# Patient Record
Sex: Female | Born: 1965 | Race: Black or African American | Hispanic: No | Marital: Single | Smoking: Never smoker
Health system: Southern US, Community
[De-identification: ages and names within clinical notes are randomized; demographics above are authoritative.]

## PROBLEM LIST (undated history)

## (undated) DIAGNOSIS — Z8042 Family history of malignant neoplasm of prostate: Secondary | ICD-10-CM

## (undated) DIAGNOSIS — Z923 Personal history of irradiation: Secondary | ICD-10-CM

## (undated) DIAGNOSIS — I10 Essential (primary) hypertension: Secondary | ICD-10-CM

## (undated) DIAGNOSIS — E119 Type 2 diabetes mellitus without complications: Secondary | ICD-10-CM

## (undated) DIAGNOSIS — Z803 Family history of malignant neoplasm of breast: Secondary | ICD-10-CM

## (undated) DIAGNOSIS — Z9221 Personal history of antineoplastic chemotherapy: Secondary | ICD-10-CM

## (undated) DIAGNOSIS — C50919 Malignant neoplasm of unspecified site of unspecified female breast: Secondary | ICD-10-CM

## (undated) HISTORY — DX: Family history of malignant neoplasm of breast: Z80.3

## (undated) HISTORY — PX: BREAST BIOPSY: SHX20

## (undated) HISTORY — DX: Family history of malignant neoplasm of prostate: Z80.42

---
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
## 1998-04-24 ENCOUNTER — Other Ambulatory Visit: Admission: RE | Admit: 1998-04-24 | Discharge: 1998-04-24 | Payer: Self-pay | Admitting: *Deleted

## 2015-11-17 DIAGNOSIS — E119 Type 2 diabetes mellitus without complications: Secondary | ICD-10-CM | POA: Diagnosis not present

## 2015-11-17 DIAGNOSIS — I10 Essential (primary) hypertension: Secondary | ICD-10-CM | POA: Diagnosis not present

## 2015-11-17 DIAGNOSIS — Z0000 Encounter for general adult medical examination without abnormal findings: Secondary | ICD-10-CM | POA: Diagnosis not present

## 2016-01-17 DIAGNOSIS — Z1231 Encounter for screening mammogram for malignant neoplasm of breast: Secondary | ICD-10-CM | POA: Diagnosis not present

## 2016-01-17 DIAGNOSIS — Z6841 Body Mass Index (BMI) 40.0 and over, adult: Secondary | ICD-10-CM | POA: Diagnosis not present

## 2016-01-17 DIAGNOSIS — Z01419 Encounter for gynecological examination (general) (routine) without abnormal findings: Secondary | ICD-10-CM | POA: Diagnosis not present

## 2016-01-23 DIAGNOSIS — Z1211 Encounter for screening for malignant neoplasm of colon: Secondary | ICD-10-CM | POA: Diagnosis not present

## 2016-01-23 DIAGNOSIS — K5730 Diverticulosis of large intestine without perforation or abscess without bleeding: Secondary | ICD-10-CM | POA: Diagnosis not present

## 2016-01-23 DIAGNOSIS — K648 Other hemorrhoids: Secondary | ICD-10-CM | POA: Diagnosis not present

## 2016-05-20 ENCOUNTER — Emergency Department (HOSPITAL_BASED_OUTPATIENT_CLINIC_OR_DEPARTMENT_OTHER): Payer: Federal, State, Local not specified - PPO

## 2016-05-20 ENCOUNTER — Encounter (HOSPITAL_BASED_OUTPATIENT_CLINIC_OR_DEPARTMENT_OTHER): Payer: Self-pay

## 2016-05-20 ENCOUNTER — Emergency Department (HOSPITAL_BASED_OUTPATIENT_CLINIC_OR_DEPARTMENT_OTHER)
Admission: EM | Admit: 2016-05-20 | Discharge: 2016-05-20 | Disposition: A | Discharge status: Home / Self Care | Payer: Federal, State, Local not specified - PPO | Attending: Emergency Medicine | Admitting: Emergency Medicine

## 2016-05-20 DIAGNOSIS — Z7982 Long term (current) use of aspirin: Secondary | ICD-10-CM | POA: Insufficient documentation

## 2016-05-20 DIAGNOSIS — Z7984 Long term (current) use of oral hypoglycemic drugs: Secondary | ICD-10-CM | POA: Insufficient documentation

## 2016-05-20 DIAGNOSIS — M79652 Pain in left thigh: Secondary | ICD-10-CM | POA: Diagnosis not present

## 2016-05-20 DIAGNOSIS — E119 Type 2 diabetes mellitus without complications: Secondary | ICD-10-CM | POA: Diagnosis not present

## 2016-05-20 DIAGNOSIS — S8992XA Unspecified injury of left lower leg, initial encounter: Secondary | ICD-10-CM | POA: Diagnosis not present

## 2016-05-20 DIAGNOSIS — I10 Essential (primary) hypertension: Secondary | ICD-10-CM | POA: Diagnosis not present

## 2016-05-20 DIAGNOSIS — M79662 Pain in left lower leg: Secondary | ICD-10-CM | POA: Insufficient documentation

## 2016-05-20 HISTORY — DX: Essential (primary) hypertension: I10

## 2016-05-20 HISTORY — DX: Type 2 diabetes mellitus without complications: E11.9

## 2016-05-20 NOTE — ED Notes (Signed)
Pt refused immobilizer , switched to ace wrap Pa aware

## 2016-05-20 NOTE — ED Provider Notes (Signed)
[facility] Provider Note   CSN: [id number] Arrival date & time: 05/20/16  1134     History   Chief Complaint Chief Complaint  Patient presents with  . Leg Pain    HPI Cassandra Sanders is a 51 y.o. female who presents today with chief complaint ongoing suddenly worsening left leg pain. She states she has had pain in her left calf since one month ago when she fell forward and hit her knee and calf on an object while getting dressed. She states pain is intermittent, and crampy in nature "like a Charlie horse".  She states pain increased yesterday after a long day walking for several hours and being an usher at [employer]. She states yesterday pain radiated into her thigh and buttocks, were usually pain does not radiate. She states elevation and icy hot have been helpful. Walking and exercise helps the pain. Pain is aggravated by sitting down too long. She states she knows she has hypertension but did not take her blood pressure medication today.  Denies numbness, tingling, weakness, head trauma, LOC, CP, SOB, abd pain, n/v/d, back or neck pain  The history is provided by the patient.    Past Medical History:  Diagnosis Date  . Diabetes mellitus without complication ([place])   . Hypertension     There are no active problems to display for this patient.   History reviewed. No pertinent surgical history.  OB History    No data available       Home Medications    Prior to Admission medications   Medication Sig Start Date End Date Taking? Authorizing Provider  aspirin EC 81 MG tablet Take 81 mg by mouth daily.   Yes [provider]  LISINOPRIL PO Take by mouth.   Yes [provider]  METFORMIN HCL PO Take by mouth.   Yes [provider]    Family History No family history on file.  Social History Social History  Substance Use Topics  . Smoking status: Never Smoker  . Smokeless tobacco: Never Used  . Alcohol use No     Allergies     Patient has no known allergies.   Review of Systems Review of Systems  Constitutional: Negative for chills and fever.  Respiratory: Negative for shortness of breath.   Cardiovascular: Negative for chest pain.  Gastrointestinal: Negative for abdominal pain, diarrhea, nausea and vomiting.  Musculoskeletal: Positive for myalgias. Negative for back pain, joint swelling and neck pain.  Neurological: Negative for syncope, weakness and numbness.  All other systems reviewed and are negative.    Physical Exam Updated Vital Signs BP (!) 176/100 Comment: pt states did not take BP meds today  Pulse 89   Temp 98.7 F (37.1 C) (Oral)   Resp 18   Ht 5\' 9"  (1.753 m)   Wt 133.4 kg   SpO2 98%   BMI 43.42 kg/m   Physical Exam  Constitutional: She appears well-developed and well-nourished. No distress.  HENT:  Head: Normocephalic and atraumatic.  Eyes: Conjunctivae are normal.  Neck: Neck supple. No JVD present. No tracheal deviation present.  Cardiovascular: Normal rate, regular rhythm and intact distal pulses.   No murmur heard. 2+ radial and DP/PT pulses bl, negative homans  Pulmonary/Chest: Effort normal and breath sounds normal. No respiratory distress.  Abdominal: Soft. There is no tenderness.  Musculoskeletal: Normal range of motion. She exhibits tenderness. She exhibits no deformity.  Full range of motion of left knee and hip. No varus or  valgus instability. Negative anterior/posterior drawer tests. No tenderness to palpation along the joint line, MCL, LCL. 5/5 strength of BLE. No tenderness to palpation along the hip. There is lateral mid calf tenderness with no swelling, or erythema. Bilateral ankles with swelling, no erythema or tenderness. Patient states that this is chronic since birth and unchanged. Compartments soft.   Neurological: She is alert.  Skin: Skin is warm and dry.  Psychiatric: She has a normal mood and affect. Her behavior is normal.  Nursing note and vitals  reviewed.    ED Treatments / Results  Labs (all labs ordered are listed, but only abnormal results are displayed) Labs Reviewed - No data to display  EKG  EKG Interpretation None       Radiology Dg Tibia/fibula Left  Result Date: 05/20/2016 CLINICAL DATA:  Fall 1 month ago.  Calf pain EXAM: LEFT TIBIA AND FIBULA - 2 VIEW COMPARISON:  None FINDINGS: Mild degenerative changes in the left knee. No acute bony abnormality. Specifically, no fracture, subluxation, or dislocation. Soft tissues are intact. IMPRESSION: No acute bony abnormality. Electronically Signed   By: Rolm Baptise M.D.   On: 05/20/2016 12:48   US Venous Img Lower Unilateral Left  Result Date: 05/20/2016 CLINICAL DATA:  Left lower extremity pain and tightness after falling 1 month ago. EXAM: Left LOWER EXTREMITY VENOUS DOPPLER ULTRASOUND TECHNIQUE: Gray-scale sonography with graded compression, as well as color Doppler and duplex ultrasound were performed to evaluate the lower extremity deep venous systems from the level of the common femoral vein and including the common femoral, femoral, profunda femoral, popliteal and calf veins including the posterior tibial, peroneal and gastrocnemius veins when visible. The superficial great saphenous vein was also interrogated. Spectral Doppler was utilized to evaluate flow at rest and with distal augmentation maneuvers in the common femoral, femoral and popliteal veins. COMPARISON:  None. FINDINGS: Contralateral Common Femoral Vein: Respiratory phasicity is normal and symmetric with the symptomatic side. No evidence of thrombus. Normal compressibility. Common Femoral Vein: No evidence of thrombus. Normal compressibility, respiratory phasicity and response to augmentation. Saphenofemoral Junction: No evidence of thrombus. Normal compressibility and flow on color Doppler imaging. Profunda Femoral Vein: No evidence of thrombus. Normal compressibility and flow on color Doppler imaging. Femoral  Vein: No evidence of thrombus. Normal compressibility, respiratory phasicity and response to augmentation. Popliteal Vein: No evidence of thrombus. Normal compressibility, respiratory phasicity and response to augmentation. Calf Veins: No evidence of thrombus. Normal compressibility and flow on color Doppler imaging. Superficial Great Saphenous Vein: No evidence of thrombus. Normal compressibility and flow on color Doppler imaging. Venous Reflux:  None. Other Findings:  None. IMPRESSION: No evidence of DVT within the left lower extremity. Electronically Signed   By: Nelson Chimes M.D.   On: 05/20/2016 12:41    Procedures Procedures (including critical care time)  Medications Ordered in ED Medications - No data to display   Initial Impression / Assessment and Plan / ED Course  I have reviewed the triage vital signs and the nursing notes.  Pertinent labs & imaging results that were available during my care of the patient were reviewed by me and considered in my medical decision making (see chart for details).     Patient with ongoing left lateral calf pain for 1 month. Hypertensive, discussed taking her blood pressure medication (which she did not do today) and follow up with PCP for reevaluation. Neurovascularly intact. X-rays negative for fracture dislocation or other abnormality, obtained duplex which showed no evidence of  DVT. Low suspicion of compartment syndrome or arterial pathology. Examination of the knee shows no ligamentous laxity or evidence of Baker's cyst. No further emergent workup required. Given knee immobilizer, discussed continued RICE therapy, tylenol, NSAIDs, heat, ice, and follow up with orthopedics for further evaluation. Discussed strict ED return precautions. Pt verbalized understanding of and agreement with plan and is safe for discharge home at this time. Final Clinical Impressions(s) / ED Diagnoses   Final diagnoses:  Pain of left calf    New Prescriptions Discharge  Medication List as of 05/20/2016  1:14 PM       Renita Papa, PA-C 05/20/16 2010    Veryl Speak, MD 05/27/16 [phone]

## 2016-05-20 NOTE — Discharge Instructions (Signed)
Apply heat or ice to the affected area. Alternate ibuprofen and Tylenol as needed for pain. You may use knee immobilizer for comfort. Follow-up with the orthopedic doctors for further evaluation. Return to the ED if any concerning symptoms develop. Continue taking your blood pressure medications daily.   Use Moist heat therapy by taking a dish rag and soaking in water then ringing out excess water then microwave for 10-15 seconds until hot but not so hot that it would burn and heat to areas of soreness for 15 minutes, multiple times a day.

## 2016-05-20 NOTE — ED Triage Notes (Signed)
Pt states she injured left LE with slip approx 1 month-c/o increase in pain "tight" started yesterday-NAD-steady gait

## 2016-05-20 NOTE — ED Notes (Signed)
ED Provider at bedside. 

## 2016-05-27 ENCOUNTER — Ambulatory Visit (INDEPENDENT_AMBULATORY_CARE_PROVIDER_SITE_OTHER): Payer: Federal, State, Local not specified - PPO | Admitting: Family Medicine

## 2016-05-27 ENCOUNTER — Encounter: Payer: Self-pay | Admitting: Family Medicine

## 2016-05-27 DIAGNOSIS — S86819A Strain of other muscle(s) and tendon(s) at lower leg level, unspecified leg, initial encounter: Secondary | ICD-10-CM | POA: Diagnosis not present

## 2016-05-27 NOTE — Patient Instructions (Signed)
You have a calf strain Calf raises 3 sets of 10 once a day - when easy can do them on one leg - finally can advance to doing them on a step. I would do the exercises daily for about 4 weeks. Compression sleeve or ACE wrap only if needed at this point. Icing for 15 minutes at a time 3-4 times a day Follow up with me as needed.

## 2016-05-30 DIAGNOSIS — S86819A Strain of other muscle(s) and tendon(s) at lower leg level, unspecified leg, initial encounter: Secondary | ICD-10-CM | POA: Insufficient documentation

## 2016-05-30 NOTE — Assessment & Plan Note (Signed)
doppler u/s negative.  Pain resolved at this point. Shown home exercises to do daily.  Compression sleeve, icing, tylenol only if needed.

## 2016-05-30 NOTE — Progress Notes (Signed)
PCP: Lennie Odor, PA-C  Subjective:   HPI: Patient is a 50 y.o. female here for left calf injury.  Patient reports about 3 weeks ago the smoke detector went off when she was getting dressed. She got out of bed quickly and struck lateral left calf on end of the bed. Pain was fairly severe then. Required taking tylenol. She went to be seen in ED with concern for blood clot - doppler was negative. She walks a lot at work. Pain level now 0/10 and doesn't bother her. No skin changes, numbness.  Past Medical History:  Diagnosis Date  . Diabetes mellitus without complication ([place])   . Hypertension     Current Outpatient Prescriptions on File Prior to Visit  Medication Sig Dispense Refill  . aspirin EC 81 MG tablet Take 81 mg by mouth daily.    Marland Kitchen METFORMIN HCL PO Take by mouth.     No current facility-administered medications on file prior to visit.     No past surgical history on file.  No Known Allergies  Social History   Social History  . Marital status: Single    Spouse name: N/A  . Number of children: N/A  . Years of education: N/A   Occupational History  . Not on file.   Social History Main Topics  . Smoking status: Never Smoker  . Smokeless tobacco: Never Used  . Alcohol use No  . Drug use: No  . Sexual activity: Not on file   Other Topics Concern  . Not on file   Social History Narrative  . No narrative on file    No family history on file.  Ht 5\' 9"  (1.753 m)   Wt 294 lb 12.8 oz (133.7 kg)   BMI 43.53 kg/m   Review of Systems: See HPI above.     Objective:  Physical Exam:  Gen: NAD, comfortable in exam room  Left leg: No gross deformity, swelling, bruising. No TTP. FROM ankle and knee without pain. Strength 5/5 with knee flexion/extension, all ankle motions. NVI distally. Negative thompsons.  Right leg: FROM without pain.   Assessment & Plan:  1. Left calf strain - doppler u/s negative.  Pain resolved at this point. Shown home  exercises to do daily.  Compression sleeve, icing, tylenol only if needed.

## 2016-06-18 ENCOUNTER — Ambulatory Visit: Payer: Federal, State, Local not specified - PPO | Admitting: Family Medicine

## 2016-06-19 ENCOUNTER — Ambulatory Visit (INDEPENDENT_AMBULATORY_CARE_PROVIDER_SITE_OTHER): Payer: Federal, State, Local not specified - PPO | Admitting: Family Medicine

## 2016-06-19 DIAGNOSIS — M25511 Pain in right shoulder: Secondary | ICD-10-CM

## 2016-06-19 MED ORDER — DICLOFENAC SODIUM 75 MG PO TBEC: 75.0000 mg | DELAYED_RELEASE_TABLET | Freq: Two times a day (BID) | ORAL | 1 refills | Status: DC

## 2016-06-19 NOTE — Patient Instructions (Signed)
You have rotator cuff impingement Try to avoid painful activities (overhead activities, lifting with extended arm) as much as possible. Diclofenac 75mg  twice a day with food for pain and inflammation- take regularly for 7-10 days then as needed. Can take tylenol in addition to this. Subacromial injection may be beneficial to help with pain and to decrease inflammation. Consider physical therapy with transition to home exercise program. Do home exercise program with theraband and scapular stabilization exercises daily - these are very important for long term relief even if an injection was given.  3 sets of 10 once a day. If not improving at follow-up we will consider further imaging, injection, physical therapy, and/or nitro patches. Typically follow up with me in 1 month but call me sooner if you're struggling and want to try therapy or injection.

## 2016-06-20 ENCOUNTER — Encounter: Payer: Self-pay | Admitting: Family Medicine

## 2016-06-21 DIAGNOSIS — M25511 Pain in right shoulder: Secondary | ICD-10-CM | POA: Insufficient documentation

## 2016-06-21 NOTE — Assessment & Plan Note (Signed)
2/2 rotator cuff impingement.  Reviewed home exercises to do daily.  Diclofenac twice a day with food.  Will consider physical therapy, injection, nitro patches if not improving.  F/u in 1 month.

## 2016-06-21 NOTE — Progress Notes (Signed)
PCP: Lennie Odor, PA-C  Subjective:   HPI: Patient is a 51 y.o. female here for right shoulder pain.  Patient reports about 2 weeks ago she woke up with right shoulder pain. Felt she slept wrong on the sofa leading to pain. Pain level 4/10, sharp and lateral. Radiates down to elbow. Has been using heating pad. Worse with overhead motions, lying on this side. No skin changes, numbness.  Past Medical History:  Diagnosis Date  . Diabetes mellitus without complication ([place])   . Hypertension     Current Outpatient Prescriptions on File Prior to Visit  Medication Sig Dispense Refill  . aspirin EC 81 MG tablet Take 81 mg by mouth daily.    Marland Kitchen JANUVIA 100 MG tablet     . lisinopril (PRINIVIL,ZESTRIL) 10 MG tablet Take 10 mg by mouth daily.  2  . METFORMIN HCL PO Take by mouth.     No current facility-administered medications on file prior to visit.     No past surgical history on file.  No Known Allergies  Social History   Social History  . Marital status: Single    Spouse name: N/A  . Number of children: N/A  . Years of education: N/A   Occupational History  . Not on file.   Social History Main Topics  . Smoking status: Never Smoker  . Smokeless tobacco: Never Used  . Alcohol use No  . Drug use: No  . Sexual activity: Not on file   Other Topics Concern  . Not on file   Social History Narrative  . No narrative on file    No family history on file.  BP (!) 169/106   Ht 5\' 9"  (1.753 m)   Wt 285 lb (129.3 kg)   BMI 42.09 kg/m   Review of Systems: See HPI above.     Objective:  Physical Exam:  Gen: NAD, comfortable in exam room  Right shoulder: No swelling, ecchymoses.  No gross deformity. No TTP. FROM with painful arc. Negative Hawkins, Neers. Negative Yergasons. Strength 5/5 with empty can and resisted internal/external rotation. Negative apprehension. NV intact distally.  Left shoulder: FROM without pain.   Assessment & Plan:  1.  Right shoulder pain - 2/2 rotator cuff impingement.  Reviewed home exercises to do daily.  Diclofenac twice a day with food.  Will consider physical therapy, injection, nitro patches if not improving.  F/u in 1 month.

## 2016-07-19 ENCOUNTER — Ambulatory Visit: Payer: Federal, State, Local not specified - PPO | Admitting: Family Medicine

## 2016-08-30 DIAGNOSIS — Z7984 Long term (current) use of oral hypoglycemic drugs: Secondary | ICD-10-CM | POA: Diagnosis not present

## 2016-08-30 DIAGNOSIS — I10 Essential (primary) hypertension: Secondary | ICD-10-CM | POA: Diagnosis not present

## 2016-08-30 DIAGNOSIS — E119 Type 2 diabetes mellitus without complications: Secondary | ICD-10-CM | POA: Diagnosis not present

## 2016-12-06 DIAGNOSIS — E1165 Type 2 diabetes mellitus with hyperglycemia: Secondary | ICD-10-CM | POA: Diagnosis not present

## 2017-01-28 DIAGNOSIS — Z0000 Encounter for general adult medical examination without abnormal findings: Secondary | ICD-10-CM | POA: Diagnosis not present

## 2017-01-28 DIAGNOSIS — E119 Type 2 diabetes mellitus without complications: Secondary | ICD-10-CM | POA: Diagnosis not present

## 2017-01-28 DIAGNOSIS — I10 Essential (primary) hypertension: Secondary | ICD-10-CM | POA: Diagnosis not present

## 2017-03-27 DIAGNOSIS — Z780 Asymptomatic menopausal state: Secondary | ICD-10-CM | POA: Diagnosis not present

## 2017-03-27 DIAGNOSIS — Z1231 Encounter for screening mammogram for malignant neoplasm of breast: Secondary | ICD-10-CM | POA: Diagnosis not present

## 2017-03-27 DIAGNOSIS — Z6841 Body Mass Index (BMI) 40.0 and over, adult: Secondary | ICD-10-CM | POA: Diagnosis not present

## 2017-03-27 DIAGNOSIS — Z01419 Encounter for gynecological examination (general) (routine) without abnormal findings: Secondary | ICD-10-CM | POA: Diagnosis not present

## 2017-07-11 DIAGNOSIS — N912 Amenorrhea, unspecified: Secondary | ICD-10-CM | POA: Diagnosis not present

## 2017-08-01 DIAGNOSIS — I10 Essential (primary) hypertension: Secondary | ICD-10-CM | POA: Diagnosis not present

## 2017-08-01 DIAGNOSIS — E1169 Type 2 diabetes mellitus with other specified complication: Secondary | ICD-10-CM | POA: Diagnosis not present

## 2017-08-15 DIAGNOSIS — Z30432 Encounter for removal of intrauterine contraceptive device: Secondary | ICD-10-CM | POA: Diagnosis not present

## 2017-11-07 DIAGNOSIS — E1169 Type 2 diabetes mellitus with other specified complication: Secondary | ICD-10-CM | POA: Diagnosis not present

## 2018-02-13 DIAGNOSIS — I10 Essential (primary) hypertension: Secondary | ICD-10-CM | POA: Diagnosis not present

## 2018-02-13 DIAGNOSIS — E1169 Type 2 diabetes mellitus with other specified complication: Secondary | ICD-10-CM | POA: Diagnosis not present

## 2018-02-13 DIAGNOSIS — Z0000 Encounter for general adult medical examination without abnormal findings: Secondary | ICD-10-CM | POA: Diagnosis not present

## 2018-05-15 DIAGNOSIS — Z01419 Encounter for gynecological examination (general) (routine) without abnormal findings: Secondary | ICD-10-CM | POA: Diagnosis not present

## 2018-05-15 DIAGNOSIS — Z1231 Encounter for screening mammogram for malignant neoplasm of breast: Secondary | ICD-10-CM | POA: Diagnosis not present

## 2018-05-15 DIAGNOSIS — Z6841 Body Mass Index (BMI) 40.0 and over, adult: Secondary | ICD-10-CM | POA: Diagnosis not present

## 2018-05-18 ENCOUNTER — Other Ambulatory Visit: Payer: Self-pay | Admitting: Obstetrics and Gynecology

## 2018-05-18 DIAGNOSIS — R928 Other abnormal and inconclusive findings on diagnostic imaging of breast: Secondary | ICD-10-CM

## 2018-06-02 ENCOUNTER — Other Ambulatory Visit: Payer: Self-pay | Admitting: Obstetrics and Gynecology

## 2018-06-02 ENCOUNTER — Ambulatory Visit
Admission: RE | Admit: 2018-06-02 | Discharge: 2018-06-02 | Disposition: A | Discharge status: Home / Self Care | Payer: Federal, State, Local not specified - PPO | Source: Ambulatory Visit | Attending: Obstetrics and Gynecology | Admitting: Obstetrics and Gynecology

## 2018-06-02 ENCOUNTER — Other Ambulatory Visit: Payer: Self-pay

## 2018-06-02 DIAGNOSIS — R928 Other abnormal and inconclusive findings on diagnostic imaging of breast: Secondary | ICD-10-CM

## 2018-06-02 DIAGNOSIS — N6322 Unspecified lump in the left breast, upper inner quadrant: Secondary | ICD-10-CM | POA: Diagnosis not present

## 2018-06-02 DIAGNOSIS — N6320 Unspecified lump in the left breast, unspecified quadrant: Secondary | ICD-10-CM

## 2018-06-04 ENCOUNTER — Ambulatory Visit
Admission: RE | Admit: 2018-06-04 | Discharge: 2018-06-04 | Disposition: A | Discharge status: Home / Self Care | Payer: Federal, State, Local not specified - PPO | Source: Ambulatory Visit | Attending: Obstetrics and Gynecology | Admitting: Obstetrics and Gynecology

## 2018-06-04 ENCOUNTER — Other Ambulatory Visit: Payer: Self-pay

## 2018-06-04 DIAGNOSIS — C50212 Malignant neoplasm of upper-inner quadrant of left female breast: Secondary | ICD-10-CM | POA: Diagnosis not present

## 2018-06-04 DIAGNOSIS — N6320 Unspecified lump in the left breast, unspecified quadrant: Secondary | ICD-10-CM

## 2018-06-04 DIAGNOSIS — N6322 Unspecified lump in the left breast, upper inner quadrant: Secondary | ICD-10-CM | POA: Diagnosis not present

## 2018-06-04 DIAGNOSIS — C50919 Malignant neoplasm of unspecified site of unspecified female breast: Secondary | ICD-10-CM

## 2018-06-04 HISTORY — DX: Malignant neoplasm of unspecified site of unspecified female breast: C50.919

## 2018-06-05 ENCOUNTER — Telehealth: Payer: Self-pay | Admitting: Oncology

## 2018-06-05 ENCOUNTER — Encounter: Payer: Self-pay | Admitting: *Deleted

## 2018-06-05 NOTE — Telephone Encounter (Signed)
LVM for patient to return call in reference to morning BC on 6/3, packet mailed and emailed to patient

## 2018-06-08 ENCOUNTER — Other Ambulatory Visit: Payer: Self-pay | Admitting: *Deleted

## 2018-06-08 DIAGNOSIS — C50212 Malignant neoplasm of upper-inner quadrant of left female breast: Secondary | ICD-10-CM

## 2018-06-08 DIAGNOSIS — Z171 Estrogen receptor negative status [ER-]: Secondary | ICD-10-CM | POA: Insufficient documentation

## 2018-06-09 ENCOUNTER — Encounter: Payer: Self-pay | Admitting: Oncology

## 2018-06-09 NOTE — Progress Notes (Signed)
[place]  [phone] [phone]     ID: Cassandra Sanders DOB: [birth year]  MR#: [id number]  JOI#:[id number]  Patient Care Team: Lennie Odor, PA-C as PCP - General (Nurse Practitioner) Rockwell Germany, RN as Oncology Nurse Navigator Mauro Kaufmann, RN as Oncology Nurse Navigator Darrill Vreeland, Virgie Dad, MD as Consulting Physician (Oncology) Rolm Bookbinder, MD as Consulting Physician (General Surgery) Kyung Rudd, MD as Consulting Physician (Radiation Oncology) Marylynn Pearson, MD as Consulting Physician (Obstetrics and Gynecology) Chauncey Cruel, MD OTHER MD:  CHIEF COMPLAINT: Breast cancer, moderately estrogen receptor positive  CURRENT TREATMENT: Awaiting definitive surgery   HISTORY OF CURRENT ILLNESS: Cassandra Sanders had routine screening mammography on 05/15/2018 at [facility] showing a possible abnormality in the left breast. She underwent bilateral diagnostic mammography with tomography and left breast ultrasonography at [facility] on 06/02/2018 showing: breast density category B; suspicious approximate 0.9 cm mass involving the upper inner quadrant of the left breast at posterior depth which account for the screening mammographic finding; no pathologic left axillary lymphadenopathy.  Accordingly on 06/04/2018 she proceeded to biopsy of the left breast area in question. The pathology from this procedure ([id number]) showed: invasive ductal carcinoma, grade 3. Prognostic indicators significant for: estrogen receptor, 60% positive with moderate staining intensity and progesterone receptor, 0% negative. Proliferation marker Ki67 at 80%. HER2 negative by immunohistochemistry (0).  The patient's subsequent history is as detailed below.   INTERVAL HISTORY: Cassandra Sanders was evaluated in the multidisciplinary breast cancer clinic on 06/10/2018. Her case was also presented at the multidisciplinary breast cancer conference on the same day. At  that time a preliminary plan was proposed: Breast conserving surgery with sentinel lymph node sampling, Oncotype, but likely to need chemotherapy, adjuvant radiation, antiestrogens   REVIEW OF SYSTEMS: Cassandra Sanders reports doing well overall. She is currently without complaints.  She exercises by walking, 1 to 2 miles at least twice a week.  She is also on her feet a lot at work.  There were no specific symptoms leading to the original mammogram, which was routinely scheduled. The patient denies unusual headaches, visual changes, nausea, vomiting, stiff neck, dizziness, or gait imbalance. There has been no cough, phlegm production, or pleurisy, no chest pain or pressure, and no change in bowel or bladder habits. The patient denies fever, rash, bleeding, unexplained fatigue or unexplained weight loss. A detailed review of systems was otherwise entirely negative.   PAST MEDICAL HISTORY: Past Medical History:  Diagnosis Date   Breast cancer ([place]) 06/04/2018   Diabetes mellitus without complication ([place])    Hypertension     PAST SURGICAL HISTORY: History reviewed. No pertinent surgical history.  FAMILY HISTORY: No family history on file. Patient's father was in his 93s when he was murdered. Patient's mother is currently (as of 06/2018) at age 47. The patient denies a family hx of breast or ovarian cancer. She has 3 brothers and no biological sisters.  GYNECOLOGIC HISTORY:  No LMP recorded. (Menstrual status: IUD). Menarche: 53 years old Age at first live birth: 53 years old Cassandra Sanders P 1 LMP June 2015 Contraceptive used from age [age] with no complications HRT none  Hysterectomy? no BSO? no   SOCIAL HISTORY: (updated 08/07/1029)  Cassandra Sanders is currently working as a Magazine features editor at the [employer]. She is currently engaged to [facility]. She lives at home with her son. Son Cassandra Sanders, age 31, who works here in [place] as a Museum/gallery exhibitions officer.  The patient attends  [employer].    ADVANCED DIRECTIVES: not in place; at this 06/10/2018 visit the patient was given the appropriate documents to complete and notarize at her discretion.  She intends to name her son is her healthcare power of attorney   HEALTH MAINTENANCE: Social History   Tobacco Use   Smoking status: Never Smoker   Smokeless tobacco: Never Used  Substance Use Topics   Alcohol use: No   Drug use: No     Colonoscopy: 2016  PAP: 05/15/2018, negative  Bone density: never done   No Known Allergies  Current Outpatient Medications  Medication Sig Dispense Refill   aspirin EC 81 MG tablet Take 81 mg by mouth daily.     JANUVIA 100 MG tablet daily.      lisinopril (PRINIVIL,ZESTRIL) 10 MG tablet Take 10 mg by mouth daily.  2   METFORMIN HCL PO Take 1,000 mg by mouth 3 (three) times daily before meals.      diclofenac (VOLTAREN) 75 MG EC tablet Take 1 tablet (75 mg total) by mouth 2 (two) times daily. (Patient not taking: Reported on 06/10/2018) 60 tablet 1   No current facility-administered medications for this visit.     OBJECTIVE: Morbidly obese African-American woman in no acute distress  Vitals:   06/10/18 0922  BP: (!) 177/109  Pulse: 98  Resp: 18  Temp: 97.9 F (36.6 C)  SpO2: 100%     Body mass index is 41.97 kg/m.   Wt Readings from Last 3 Encounters:  06/10/18 284 lb 3.2 oz (128.9 kg)  06/20/16 285 lb (129.3 kg)  05/27/16 294 lb 12.8 oz (133.7 kg)      ECOG FS:1 - Symptomatic but completely ambulatory  Ocular: Sclerae unicteric, pupils round and equal Ear-nose-throat: Wearing a mask Lymphatic: No cervical or supraclavicular adenopathy Lungs no rales or rhonchi Heart regular rate and rhythm Abd soft, nontender, positive bowel sounds MSK no focal spinal tenderness, no joint edema Neuro: non-focal, well-oriented, appropriate affect Breasts: I do not palpate a mass in either breast.  The biopsy site is intact on the left.  Both axillae are  benign.   LAB RESULTS:  CMP     Component Value Date/Time   NA 139 06/10/2018 0859   K 3.8 06/10/2018 0859   CL 104 06/10/2018 0859   CO2 25 06/10/2018 0859   GLUCOSE 190 (H) 06/10/2018 0859   BUN 16 06/10/2018 0859   CREATININE 1.14 (H) 06/10/2018 0859   CALCIUM 9.1 06/10/2018 0859   PROT 7.8 06/10/2018 0859   ALBUMIN 4.0 06/10/2018 0859   AST 13 (L) 06/10/2018 0859   ALT 17 06/10/2018 0859   ALKPHOS 105 06/10/2018 0859   BILITOT 0.3 06/10/2018 0859   GFRNONAA 54 (L) 06/10/2018 0859   GFRAA >60 06/10/2018 0859    No results found for: TOTALPROTELP, ALBUMINELP, A1GS, A2GS, BETS, BETA2SER, GAMS, MSPIKE, SPEI  No results found for: KPAFRELGTCHN, LAMBDASER, [facility]  Lab Results  Component Value Date   WBC 6.1 06/10/2018   NEUTROABS 3.1 06/10/2018   HGB 11.8 (L) 06/10/2018   HCT 36.5 06/10/2018   MCV 91.3 06/10/2018   PLT 303 06/10/2018    @LASTCHEMISTRY @  No results found for: LABCA2  No components found for: [id number]  No results for input(s): INR in the last 168 hours.  No results found for: LABCA2  No results found for: RCV893  No results found for: YBO175  No results found for: ZWC585  No results found for: ID7824  No components found for: HGQUANT  No results found for: CEA1 / No results found for: CEA1   No results found for: AFPTUMOR  No results found for: CHROMOGRNA  No results found for: PSA1  Appointment on 06/10/2018  Component Date Value Ref Range Status   Sodium 06/10/2018 139  135 - 145 mmol/L Final   Potassium 06/10/2018 3.8  3.5 - 5.1 mmol/L Final   Chloride 06/10/2018 104  98 - 111 mmol/L Final   CO2 06/10/2018 25  22 - 32 mmol/L Final   Glucose, Bld 06/10/2018 190* 70 - 99 mg/dL Final   BUN 06/10/2018 16  6 - 20 mg/dL Final   Creatinine 06/10/2018 1.14* 0.44 - 1.00 mg/dL Final   Calcium 06/10/2018 9.1  8.9 - 10.3 mg/dL Final   Total Protein 06/10/2018 7.8  6.5 - 8.1 g/dL Final   Albumin 06/10/2018 4.0  3.5 -  5.0 g/dL Final   AST 06/10/2018 13* 15 - 41 U/L Final   ALT 06/10/2018 17  0 - 44 U/L Final   Alkaline Phosphatase 06/10/2018 105  38 - 126 U/L Final   Total Bilirubin 06/10/2018 0.3  0.3 - 1.2 mg/dL Final   GFR, Est Non Af Am 06/10/2018 54* >60 mL/min Final   GFR, Est AFR Am 06/10/2018 >60  >60 mL/min Final   Anion gap 06/10/2018 10  5 - 15 Final   Performed at [facility], [place] [address], [place], [place] [address]   WBC Count 06/10/2018 6.1  4.0 - 10.5 K/uL Final   RBC 06/10/2018 4.00  3.87 - 5.11 MIL/uL Final   Hemoglobin 06/10/2018 11.8* 12.0 - 15.0 g/dL Final   HCT 06/10/2018 36.5  36.0 - 46.0 % Final   MCV 06/10/2018 91.3  80.0 - 100.0 fL Final   MCH 06/10/2018 29.5  26.0 - 34.0 pg Final   MCHC 06/10/2018 32.3  30.0 - 36.0 g/dL Final   RDW 06/10/2018 12.7  11.5 - 15.5 % Final   Platelet Count 06/10/2018 303  150 - 400 K/uL Final   nRBC 06/10/2018 0.0  0.0 - 0.2 % Final   Neutrophils Relative % 06/10/2018 50  % Final   Neutro Abs 06/10/2018 3.1  1.7 - 7.7 K/uL Final   Lymphocytes Relative 06/10/2018 42  % Final   Lymphs Abs 06/10/2018 2.6  0.7 - 4.0 K/uL Final   Monocytes Relative 06/10/2018 5  % Final   Monocytes Absolute 06/10/2018 0.3  0.1 - 1.0 K/uL Final   Eosinophils Relative 06/10/2018 3  % Final   Eosinophils Absolute 06/10/2018 0.2  0.0 - 0.5 K/uL Final   Basophils Relative 06/10/2018 0  % Final   Basophils Absolute 06/10/2018 0.0  0.0 - 0.1 K/uL Final   Immature Granulocytes 06/10/2018 0  % Final   Abs Immature Granulocytes 06/10/2018 0.02  0.00 - 0.07 K/uL Final   Performed at [facility], [place] [address] [place], [place] [address]    (this displays the last labs from the last 3 days)  No results found for: TOTALPROTELP, ALBUMINELP, A1GS, A2GS, BETS, BETA2SER, GAMS, MSPIKE, SPEI (this displays SPEP labs)  No results found for: KPAFRELGTCHN, LAMBDASER, KAPLAMBRATIO (kappa/lambda  light chains)  No results found for: HGBA, HGBA2QUANT, HGBFQUANT, HGBSQUAN (Hemoglobinopathy evaluation)   No results found for: LDH  No results found for: IRON, TIBC, IRONPCTSAT (Iron and TIBC)  No results found for: FERRITIN  Urinalysis No results found for: COLORURINE, APPEARANCEUR, LABSPEC, [place], GLUCOSEU, [place], [place],  Derrill Memo, UROBILINOGEN, NITRITE, LEUKOCYTESUR   STUDIES: US Breast Ltd Uni Left Inc Axilla  Result Date: 06/02/2018 CLINICAL DATA:  Recall from screening mammography with tomosynthesis, possible mass or developing asymmetry involving the UPPER INNER QUADRANT of the LEFT breast at POSTERIOR depth. EXAM: DIGITAL DIAGNOSTIC LEFT MAMMOGRAM WITH TOMO ULTRASOUND LEFT BREAST COMPARISON:  Previous exam(s). ACR Breast Density Category b: There are scattered areas of fibroglandular density. FINDINGS: Tomosynthesis and synthesized spot-compression CC and MLO views of the area of concern in the LEFT breast were obtained. The developing asymmetry in the UPPER INNER QUADRANT of the LEFT breast at POSTERIOR depth persists on the spot compression images, though I do not identify a discrete mass and there is no associated architectural distortion or suspicious calcifications. Targeted LEFT breast ultrasound is performed, showing a hypoechoic mass with irregular margins at the 10 o'clock position approximately 12 cm from nipple at POSTERIOR depth, measuring approximately 0.6 x 0.9 x 0.7 cm, demonstrating no posterior characteristics and demonstrating peripheral power Doppler flow, corresponding to the screening mammographic finding. Sonographic evaluation of the LEFT axilla demonstrates no pathologic lymphadenopathy. IMPRESSION: 1. Suspicious approximate 0.9 cm mass involving the UPPER INNER QUADRANT of the LEFT breast at POSTERIOR depth which accounts for the screening mammographic finding. 2. No pathologic LEFT axillary lymphadenopathy. RECOMMENDATION: Ultrasound-guided  core needle biopsy of LEFT breast mass. The ultrasound core needle biopsy procedure was discussed with the patient and her questions were answered. She has agreed to proceed and the biopsy has been scheduled for Thursday, May 28. I have discussed the findings and recommendations with the patient. If applicable, a reminder letter will be sent to the patient regarding the next appointment. BI-RADS CATEGORY  4: Suspicious. Electronically Signed   By: Evangeline Dakin M.D.   On: 06/02/2018 15:37   Mm Diag Breast Tomo Uni Left  Result Date: 06/02/2018 CLINICAL DATA:  Recall from screening mammography with tomosynthesis, possible mass or developing asymmetry involving the UPPER INNER QUADRANT of the LEFT breast at POSTERIOR depth. EXAM: DIGITAL DIAGNOSTIC LEFT MAMMOGRAM WITH TOMO ULTRASOUND LEFT BREAST COMPARISON:  Previous exam(s). ACR Breast Density Category b: There are scattered areas of fibroglandular density. FINDINGS: Tomosynthesis and synthesized spot-compression CC and MLO views of the area of concern in the LEFT breast were obtained. The developing asymmetry in the UPPER INNER QUADRANT of the LEFT breast at POSTERIOR depth persists on the spot compression images, though I do not identify a discrete mass and there is no associated architectural distortion or suspicious calcifications. Targeted LEFT breast ultrasound is performed, showing a hypoechoic mass with irregular margins at the 10 o'clock position approximately 12 cm from nipple at POSTERIOR depth, measuring approximately 0.6 x 0.9 x 0.7 cm, demonstrating no posterior characteristics and demonstrating peripheral power Doppler flow, corresponding to the screening mammographic finding. Sonographic evaluation of the LEFT axilla demonstrates no pathologic lymphadenopathy. IMPRESSION: 1. Suspicious approximate 0.9 cm mass involving the UPPER INNER QUADRANT of the LEFT breast at POSTERIOR depth which accounts for the screening mammographic finding. 2. No  pathologic LEFT axillary lymphadenopathy. RECOMMENDATION: Ultrasound-guided core needle biopsy of LEFT breast mass. The ultrasound core needle biopsy procedure was discussed with the patient and her questions were answered. She has agreed to proceed and the biopsy has been scheduled for Thursday, May 28. I have discussed the findings and recommendations with the patient. If applicable, a reminder letter will be sent to the patient regarding the next appointment. BI-RADS CATEGORY  4: Suspicious. Electronically Signed   By: Marcello Moores  Lawrence M.D.   On: 06/02/2018 15:37   Mm Clip Placement Left  Result Date: 06/04/2018 CLINICAL DATA:  Post biopsy mammogram of the left breast for clip placement. EXAM: DIAGNOSTIC LEFT MAMMOGRAM POST ULTRASOUND BIOPSY COMPARISON:  Previous exam(s). FINDINGS: Mammographic images were obtained following ultrasound guided biopsy of a mass in the left breast at 10 o'clock. The ribbon shaped biopsy marking clip is well positioned at the site of biopsy in the upper inner left breast. IMPRESSION: Appropriate positioning of the ribbon shaped biopsy marking clip in the upper inner left breast. Final Assessment: Post Procedure Mammograms for Marker Placement Electronically Signed   By: Ammie Ferrier M.D.   On: 06/04/2018 13:42   [place] Lt Breast Bx W Loc Dev 1st Lesion Img Bx Spec US Guide  Addendum Date: 06/05/2018   ADDENDUM REPORT: 06/05/2018 12:07 ADDENDUM: Pathology revealed GRADE III INVASIVE DUCTAL CARCINOMA of the Left breast, 10:00. This was found to be concordant by Dr. Ammie Ferrier. Pathology results were discussed with the patient by telephone. The patient reported doing well after the biopsy with tenderness at the site. Post biopsy instructions and care were reviewed and questions were answered. The patient was encouraged to call [facility] for any additional concerns. The patient was referred to [facility]  at [facility] on June 10, 2018. Pathology results reported by Terie Purser, RN on 06/05/2018. Electronically Signed   By: Ammie Ferrier M.D.   On: 06/05/2018 12:07   Result Date: 06/05/2018 CLINICAL DATA:  53 year old female presenting for ultrasound-guided biopsy of a left breast mass. EXAM: ULTRASOUND GUIDED LEFT BREAST CORE NEEDLE BIOPSY COMPARISON:  Previous exam(s). FINDINGS: I met with the patient and we discussed the procedure of ultrasound-guided biopsy, including benefits and alternatives. We discussed the high likelihood of a successful procedure. We discussed the risks of the procedure, including infection, bleeding, tissue injury, clip migration, and inadequate sampling. Informed written consent was given. The usual time-out protocol was performed immediately prior to the procedure. Lesion quadrant: Upper inner quadrant Using sterile technique and 1% Lidocaine as local anesthetic, under direct ultrasound visualization, a 14 gauge spring-loaded device was used to perform biopsy of a mass in the left breast at 10 o'clock using a inferolateral approach. At the conclusion of the procedure a ribbon shaped tissue marker clip was deployed into the biopsy cavity. Follow up 2 view mammogram was performed and dictated separately. IMPRESSION: Ultrasound guided biopsy of a left breast mass at 10 o'clock. No apparent complications. Electronically Signed: By: Ammie Ferrier M.D. On: 06/04/2018 16:31    ELIGIBLE FOR AVAILABLE RESEARCH PROTOCOL: no  ASSESSMENT: 53 y.o.  woman status post left breast upper inner quadrant biopsy 06/04/2018 for a clinical T1b N0, stage IB invasive ductal carcinoma, grade 3, estrogen receptor moderately positive, progesterone receptor negative, with an MIB-1 of 80%, and HER-2 nonamplified  (1) definitive surgery pending  (2) Oncotype to be obtained from the definitive surgical sample: Chemotherapy likely  (3) if adjuvant chemotherapy  indicated, will treat with cyclophosphamide and docetaxel x4  (4) adjuvant radiation  (5) antiestrogens at the completion of local treatment.  PLAN: I spent approximately 60 minutes face to face with Cassandra Sanders with more than 15% of that time spent in counseling and coordination of care. Specifically we reviewed the biology of the patient's diagnosis and the specifics of her situation.  We first reviewed the fact that cancer is not one disease but more than 100  different diseases and that it is important to keep them separate-- otherwise when friends and relatives discuss their own cancer experiences with Cassandra Sanders confusion can result. Similarly we explained that if breast cancer spreads to the bone or liver, the patient would not have bone cancer or liver cancer, but breast cancer in the bone and breast cancer in the liver: one cancer in three places-- not 3 different cancers which otherwise would have to be treated in 3 different ways.  We discussed the difference between local and systemic therapy. In terms of loco-regional treatment, lumpectomy plus radiation is equivalent to mastectomy as far as survival is concerned. For this reason, and because the cosmetic results are generally superior, we recommend breast conserving surgery.   We then discussed the rationale for systemic therapy. There is some risk that this cancer may have already spread to other parts of her body. Patients frequently ask at this point about bone scans, CAT scans and PET scans to find out if they have occult breast cancer somewhere else. The problem is that in early stage disease we are much more likely to find false positives then true cancers and this would expose the patient to unnecessary procedures as well as unnecessary radiation. Scans cannot answer the question the patient really would like to know, which is whether she has microscopic disease elsewhere in her body. For those reasons we do not recommend them.  Of course we  would proceed to aggressive evaluation of any symptoms that might suggest metastatic disease, but that is not the case here.  Next we went over the options for systemic therapy which are anti-estrogens, anti-HER-2 immunotherapy, and chemotherapy. Cariana does not meet criteria for anti-HER-2 immunotherapy. She is a good candidate for anti-estrogens.  The question of chemotherapy is more complicated. Chemotherapy is most effective in rapidly growing, aggressive tumors, like Tineka 's. For that reason I anticipate she will need chemo, but we are going to request an Oncotype from the initial biopsy to confirm.  The overall plan then is to start with surgery, likely proceed with chemotherapy, then adjuvant radiation, then antiestrogens.  Channelle has a good understanding of the overall plan. She agrees with it. She knows the goal of treatment in her case is cure. She will call with any problems that may develop before her next visit here.   Chauncey Cruel, MD   06/10/2018 10:48 AM Medical Oncology and Hematology [facility] [address] [place], [place] [address] Tel. [phone]    Fax. [phone]   This document serves as a record of services personally performed by Lurline Del, MD. It was created on his behalf by Wilburn Mylar, a trained medical scribe. The creation of this record is based on the scribe's personal observations and the provider's statements to them.   I, Lurline Del MD, have reviewed the above documentation for accuracy and completeness, and I agree with the above.

## 2018-06-10 ENCOUNTER — Encounter: Payer: Self-pay | Admitting: Oncology

## 2018-06-10 ENCOUNTER — Inpatient Hospital Stay: Payer: Federal, State, Local not specified - PPO | Attending: Oncology | Admitting: Oncology

## 2018-06-10 ENCOUNTER — Encounter: Payer: Self-pay | Admitting: Physical Therapy

## 2018-06-10 ENCOUNTER — Telehealth: Payer: Self-pay | Admitting: *Deleted

## 2018-06-10 ENCOUNTER — Other Ambulatory Visit: Payer: Self-pay

## 2018-06-10 ENCOUNTER — Inpatient Hospital Stay: Payer: Federal, State, Local not specified - PPO

## 2018-06-10 ENCOUNTER — Other Ambulatory Visit: Payer: Self-pay | Admitting: General Surgery

## 2018-06-10 ENCOUNTER — Ambulatory Visit
Admission: RE | Admit: 2018-06-10 | Discharge: 2018-06-10 | Disposition: A | Discharge status: Home / Self Care | Payer: Federal, State, Local not specified - PPO | Source: Ambulatory Visit | Attending: Radiation Oncology | Admitting: Radiation Oncology

## 2018-06-10 ENCOUNTER — Ambulatory Visit: Payer: Federal, State, Local not specified - PPO | Attending: General Surgery | Admitting: Physical Therapy

## 2018-06-10 VITALS — BP 177/109 | HR 98 | Temp 97.9°F | Resp 18 | Ht 69.0 in | Wt 284.2 lb

## 2018-06-10 DIAGNOSIS — Z170 Estrogen receptor positive status [ER+]: Secondary | ICD-10-CM

## 2018-06-10 DIAGNOSIS — C50212 Malignant neoplasm of upper-inner quadrant of left female breast: Secondary | ICD-10-CM | POA: Insufficient documentation

## 2018-06-10 DIAGNOSIS — C50211 Malignant neoplasm of upper-inner quadrant of right female breast: Secondary | ICD-10-CM

## 2018-06-10 DIAGNOSIS — Z6841 Body Mass Index (BMI) 40.0 and over, adult: Secondary | ICD-10-CM

## 2018-06-10 DIAGNOSIS — R293 Abnormal posture: Secondary | ICD-10-CM | POA: Diagnosis not present

## 2018-06-10 LAB — CMP (CANCER CENTER ONLY)
ALT: 17 U/L (ref 0–44)
AST: 13 U/L — ABNORMAL LOW (ref 15–41)
Albumin: 4 g/dL (ref 3.5–5.0)
Alkaline Phosphatase: 105 U/L (ref 38–126)
Anion gap: 10 (ref 5–15)
BUN: 16 mg/dL (ref 6–20)
CO2: 25 mmol/L (ref 22–32)
Calcium: 9.1 mg/dL (ref 8.9–10.3)
Chloride: 104 mmol/L (ref 98–111)
Creatinine: 1.14 mg/dL — ABNORMAL HIGH (ref 0.44–1.00)
GFR, Est AFR Am: >60 mL/min (ref >60)
GFR, Estimated: 54 mL/min — ABNORMAL LOW (ref >60)
Glucose, Bld: 190 mg/dL — ABNORMAL HIGH (ref 70–99)
Potassium: 3.8 mmol/L (ref 3.5–5.1)
Sodium: 139 mmol/L (ref 135–145)
Total Bilirubin: 0.3 mg/dL (ref 0.3–1.2)
Total Protein: 7.8 g/dL (ref 6.5–8.1)

## 2018-06-10 LAB — CBC WITH DIFFERENTIAL (CANCER CENTER ONLY)
Abs Immature Granulocytes: 0.02 10*3/uL (ref 0.00–0.07)
Basophils Absolute: 0 10*3/uL (ref 0.0–0.1)
Basophils Relative: 0 %
Eosinophils Absolute: 0.2 10*3/uL (ref 0.0–0.5)
Eosinophils Relative: 3 %
HCT: 36.5 % (ref 36.0–46.0)
Hemoglobin: 11.8 g/dL — ABNORMAL LOW (ref 12.0–15.0)
Immature Granulocytes: 0 %
Lymphocytes Relative: 42 %
Lymphs Abs: 2.6 10*3/uL (ref 0.7–4.0)
MCH: 29.5 pg (ref 26.0–34.0)
MCHC: 32.3 g/dL (ref 30.0–36.0)
MCV: 91.3 fL (ref 80.0–100.0)
Monocytes Absolute: 0.3 10*3/uL (ref 0.1–1.0)
Monocytes Relative: 5 %
Neutro Abs: 3.1 10*3/uL (ref 1.7–7.7)
Neutrophils Relative %: 50 %
Platelet Count: 303 10*3/uL (ref 150–400)
RBC: 4 MIL/uL (ref 3.87–5.11)
RDW: 12.7 % (ref 11.5–15.5)
WBC Count: 6.1 10*3/uL (ref 4.0–10.5)
nRBC: 0 % (ref 0.0–0.2)

## 2018-06-10 NOTE — Patient Instructions (Signed)

## 2018-06-10 NOTE — Progress Notes (Signed)
Radiation Oncology         [phone]) [phone] ________________________________  Name: Cassandra Sanders        MRN: [id number]  Date of Service: 06/10/2018 DOB: January 14, 1965  HR:CBULAG, Barth Kirks, PA-C  Rolm Bookbinder, MD     REFERRING PHYSICIAN: Rolm Bookbinder, MD   DIAGNOSIS: The encounter diagnosis was Malignant neoplasm of upper-inner quadrant of left breast in female, estrogen receptor positive ([place]).   HISTORY OF PRESENT ILLNESS: Cassandra Sanders is a 53 y.o. female seen in the multidisciplinary breast clinic for a new diagnosis of left breast cancer. The patient was noted to have a screening detected mass in the left breast in the upper inner quadrant.  At ultrasound, a 9 x 7 x 6 mm mass was noted at the 10 o'clock position, and her axilla was negative for adenopathy.  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  Her tumor was ER positive though moderate staining, PR negative, HER-2 negative, and her Ki-67 was 80%.  There was some concern that this may indeed represent triple negative disease.  She comes today to discuss treatment options.    PREVIOUS RADIATION THERAPY: No   PAST MEDICAL HISTORY:  Past Medical History:  Diagnosis Date   Breast cancer ([place]) 06/04/2018   Diabetes mellitus without complication ([place])    Hypertension        PAST SURGICAL HISTORY:No past surgical history on file.   FAMILY HISTORY: No family history on file.   SOCIAL HISTORY:  reports that she has never smoked. She has never used smokeless tobacco. She reports that she does not drink alcohol or use drugs.   ALLERGIES: Patient has no known allergies.   MEDICATIONS:  Current Outpatient Medications  Medication Sig Dispense Refill   aspirin EC 81 MG tablet Take 81 mg by mouth daily.     diclofenac (VOLTAREN) 75 MG EC tablet Take 1 tablet (75 mg total) by mouth 2 (two) times daily. 60 tablet 1   JANUVIA 100 MG tablet      lisinopril  (PRINIVIL,ZESTRIL) 10 MG tablet Take 10 mg by mouth daily.  2   METFORMIN HCL PO Take by mouth.     No current facility-administered medications for this encounter.      REVIEW OF SYSTEMS: On review of systems, the patient reports that she is doing well overall. She denies any chest pain, shortness of breath, cough, fevers, chills, night sweats, unintended weight changes. She denies any bowel or bladder disturbances, and denies abdominal pain, nausea or vomiting. She denies any new musculoskeletal or joint aches or pains. A complete review of systems is obtained and is otherwise negative.     PHYSICAL EXAM:  Wt Readings from Last 3 Encounters:  06/20/16 285 lb (129.3 kg)  05/27/16 294 lb 12.8 oz (133.7 kg)  05/20/16 294 lb (133.4 kg)   Temp Readings from Last 3 Encounters:  05/20/16 98.7 F (37.1 C) (Oral)   BP Readings from Last 3 Encounters:  06/20/16 (!) 169/106  05/20/16 (!) 176/100   Pulse Readings from Last 3 Encounters:  05/20/16 89    In general this is a well appearing female in no acute distress.  She's alert and oriented x4 and appropriate throughout the examination. Cardiopulmonary assessment is negative for acute distress and she exhibits normal effort.     ECOG =0  0 - Asymptomatic (Fully active, able to carry on all predisease activities without restriction)  1 - Symptomatic but  completely ambulatory (Restricted in physically strenuous activity but ambulatory and able to carry out work of a light or sedentary nature. For example, light housework, office work)  2 - Symptomatic, <50% in bed during the day (Ambulatory and capable of all self care but unable to carry out any work activities. Up and about more than 50% of waking hours)  3 - Symptomatic, >50% in bed, but not bedbound (Capable of only limited self-care, confined to bed or chair 50% or more of waking hours)  4 - Bedbound (Completely disabled. Cannot carry on any self-care. Totally confined to bed or  chair)  5 - Death   Cassandra Sanders MM, Creech RH, Tormey DC, et al. [phone]). "Toxicity and response criteria of the [facility]". [place] Oncol. 5 (6): 649-55    LABORATORY DATA:  No results found for: WBC, HGB, HCT, MCV, PLT No results found for: NA, K, CL, CO2 No results found for: ALT, AST, GGT, ALKPHOS, BILITOT    RADIOGRAPHY: US Breast Ltd Uni Left Inc Axilla  Result Date: 06/02/2018 CLINICAL DATA:  Recall from screening mammography with tomosynthesis, possible mass or developing asymmetry involving the UPPER INNER QUADRANT of the LEFT breast at POSTERIOR depth. EXAM: DIGITAL DIAGNOSTIC LEFT MAMMOGRAM WITH TOMO ULTRASOUND LEFT BREAST COMPARISON:  Previous exam(s). ACR Breast Density Category b: There are scattered areas of fibroglandular density. FINDINGS: Tomosynthesis and synthesized spot-compression CC and MLO views of the area of concern in the LEFT breast were obtained. The developing asymmetry in the UPPER INNER QUADRANT of the LEFT breast at POSTERIOR depth persists on the spot compression images, though I do not identify a discrete mass and there is no associated architectural distortion or suspicious calcifications. Targeted LEFT breast ultrasound is performed, showing a hypoechoic mass with irregular margins at the 10 o'clock position approximately 12 cm from nipple at POSTERIOR depth, measuring approximately 0.6 x 0.9 x 0.7 cm, demonstrating no posterior characteristics and demonstrating peripheral power Doppler flow, corresponding to the screening mammographic finding. Sonographic evaluation of the LEFT axilla demonstrates no pathologic lymphadenopathy. IMPRESSION: 1. Suspicious approximate 0.9 cm mass involving the UPPER INNER QUADRANT of the LEFT breast at POSTERIOR depth which accounts for the screening mammographic finding. 2. No pathologic LEFT axillary lymphadenopathy. RECOMMENDATION: Ultrasound-guided core needle biopsy of LEFT breast mass. The ultrasound core  needle biopsy procedure was discussed with the patient and her questions were answered. She has agreed to proceed and the biopsy has been scheduled for Thursday, May 28. I have discussed the findings and recommendations with the patient. If applicable, a reminder letter will be sent to the patient regarding the next appointment. BI-RADS CATEGORY  4: Suspicious. Electronically Signed   By: Evangeline Dakin M.D.   On: 06/02/2018 15:37   Mm Diag Breast Tomo Uni Left  Result Date: 06/02/2018 CLINICAL DATA:  Recall from screening mammography with tomosynthesis, possible mass or developing asymmetry involving the UPPER INNER QUADRANT of the LEFT breast at POSTERIOR depth. EXAM: DIGITAL DIAGNOSTIC LEFT MAMMOGRAM WITH TOMO ULTRASOUND LEFT BREAST COMPARISON:  Previous exam(s). ACR Breast Density Category b: There are scattered areas of fibroglandular density. FINDINGS: Tomosynthesis and synthesized spot-compression CC and MLO views of the area of concern in the LEFT breast were obtained. The developing asymmetry in the UPPER INNER QUADRANT of the LEFT breast at POSTERIOR depth persists on the spot compression images, though I do not identify a discrete mass and there is no associated architectural distortion or suspicious calcifications. Targeted LEFT breast ultrasound is  performed, showing a hypoechoic mass with irregular margins at the 10 o'clock position approximately 12 cm from nipple at POSTERIOR depth, measuring approximately 0.6 x 0.9 x 0.7 cm, demonstrating no posterior characteristics and demonstrating peripheral power Doppler flow, corresponding to the screening mammographic finding. Sonographic evaluation of the LEFT axilla demonstrates no pathologic lymphadenopathy. IMPRESSION: 1. Suspicious approximate 0.9 cm mass involving the UPPER INNER QUADRANT of the LEFT breast at POSTERIOR depth which accounts for the screening mammographic finding. 2. No pathologic LEFT axillary lymphadenopathy. RECOMMENDATION:  Ultrasound-guided core needle biopsy of LEFT breast mass. The ultrasound core needle biopsy procedure was discussed with the patient and her questions were answered. She has agreed to proceed and the biopsy has been scheduled for Thursday, May 28. I have discussed the findings and recommendations with the patient. If applicable, a reminder letter will be sent to the patient regarding the next appointment. BI-RADS CATEGORY  4: Suspicious. Electronically Signed   By: Evangeline Dakin M.D.   On: 06/02/2018 15:37   Mm Clip Placement Left  Result Date: 06/04/2018 CLINICAL DATA:  Post biopsy mammogram of the left breast for clip placement. EXAM: DIAGNOSTIC LEFT MAMMOGRAM POST ULTRASOUND BIOPSY COMPARISON:  Previous exam(s). FINDINGS: Mammographic images were obtained following ultrasound guided biopsy of a mass in the left breast at 10 o'clock. The ribbon shaped biopsy marking clip is well positioned at the site of biopsy in the upper inner left breast. IMPRESSION: Appropriate positioning of the ribbon shaped biopsy marking clip in the upper inner left breast. Final Assessment: Post Procedure Mammograms for Marker Placement Electronically Signed   By: Ammie Ferrier M.D.   On: 06/04/2018 13:42   [place] Lt Breast Bx W Loc Dev 1st Lesion Img Bx Spec US Guide  Addendum Date: 06/05/2018   ADDENDUM REPORT: 06/05/2018 12:07 ADDENDUM: Pathology revealed GRADE III INVASIVE DUCTAL CARCINOMA of the Left breast, 10:00. This was found to be concordant by Dr. Ammie Ferrier. Pathology results were discussed with the patient by telephone. The patient reported doing well after the biopsy with tenderness at the site. Post biopsy instructions and care were reviewed and questions were answered. The patient was encouraged to call [facility] for any additional concerns. The patient was referred to [facility] on June 10, 2018.  Pathology results reported by Terie Purser, RN on 06/05/2018. Electronically Signed   By: Ammie Ferrier M.D.   On: 06/05/2018 12:07   Result Date: 06/05/2018 CLINICAL DATA:  53 year old female presenting for ultrasound-guided biopsy of a left breast mass. EXAM: ULTRASOUND GUIDED LEFT BREAST CORE NEEDLE BIOPSY COMPARISON:  Previous exam(s). FINDINGS: I met with the patient and we discussed the procedure of ultrasound-guided biopsy, including benefits and alternatives. We discussed the high likelihood of a successful procedure. We discussed the risks of the procedure, including infection, bleeding, tissue injury, clip migration, and inadequate sampling. Informed written consent was given. The usual time-out protocol was performed immediately prior to the procedure. Lesion quadrant: Upper inner quadrant Using sterile technique and 1% Lidocaine as local anesthetic, under direct ultrasound visualization, a 14 gauge spring-loaded device was used to perform biopsy of a mass in the left breast at 10 o'clock using a inferolateral approach. At the conclusion of the procedure a ribbon shaped tissue marker clip was deployed into the biopsy cavity. Follow up 2 view mammogram was performed and dictated separately. IMPRESSION: Ultrasound guided biopsy of a left breast mass at 10 o'clock. No  apparent complications. Electronically Signed: By: Ammie Ferrier M.D. On: 06/04/2018 16:31       IMPRESSION/PLAN: 1. Stage IB, [id number] grade 3 ER positive, though only moderate staining invasive ductal carcinoma of the left breast. I discussed the pathology findings and reviews the nature of left breast disease. The consensus from the breast conference includes breast conservation with lumpectomy with  sentinel node biopsy.  Chemotherapy is being discussed as well given the moderate staining of her estrogen receptor.  She would also benefit from adjuvant radiotherapy to reduce the risk of local recurrence we discussed the risks,  benefits, short, and long term effects of radiotherapy, and the patient is interested in proceeding. I discussed the delivery and logistics of radiotherapy and anticipates a course of 61/2 weeks of radiotherapy. We will see her back about 2 weeks after surgery to discuss the simulation process and anticipate we starting radiotherapy about 4-6 weeks after surgery.   In a visit lasting 20 minutes, greater than 50% of the time was spent face to face discussing her case, and coordinating the patient's care.    ________________________________   Jodelle Gross, MD, PhD

## 2018-06-10 NOTE — Telephone Encounter (Signed)
Received order for oncotype testing. Requisition faxed to [facility] and [facility].

## 2018-06-10 NOTE — Therapy (Signed)
[place], [place], [address] Phone: [phone]   Fax:  [phone]  Physical Therapy Evaluation  Patient Details  Name: Cassandra Sanders MRN: [id number] Date of Birth: Feb 28, 1965 Referring Provider (PT): Dr. Rolm Bookbinder   Encounter Date: 06/10/2018  PT End of Session - 06/10/18 1133    Visit Number  1    Number of Visits  2    Date for PT Re-Evaluation  08/05/18    PT Start Time  1055    PT Stop Time  1120    PT Time Calculation (min)  25 min    Activity Tolerance  Patient tolerated treatment well    Behavior During Therapy  [facility] for tasks assessed/performed       Past Medical History:  Diagnosis Date  . Breast cancer ([place]) 06/04/2018  . Diabetes mellitus without complication ([place])   . Hypertension     History reviewed. No pertinent surgical history.  There were no vitals filed for this visit.   Subjective Assessment - 06/10/18 1122    Subjective  Patient reports she is here today to be seen by her multidisciplinary medical team for her newly diagnosed left breast cancer.    Pertinent History  Patient was diagnosed on 05/15/2018 with left grade III invasive ductal carcinoma breast cancer. It measures 9 mm and is located in the upper inner quadrant. It is ER positive, PR negative, and HER2 negative with a Ki67 of 80%. Other medical problems include diabetes, hypertension, and morbid obesity.    Patient Stated Goals  Reduce lymphedema risk and learn post op shoulder ROM HEP    Currently in Pain?  No/denies         [facility] PT Assessment - 06/10/18 0001      Assessment   Medical Diagnosis  Left breast cancer    Referring Provider (PT)  Dr. Rolm Bookbinder    Onset Date/Surgical Date  05/15/18    Hand Dominance  Left    Prior Therapy  none      Precautions   Precautions  Other (comment)    Precaution Comments  active cancer      Restrictions   Weight Bearing Restrictions  No       Balance Screen   Has the patient fallen in the past 6 months  No    Has the patient had a decrease in activity level because of a fear of falling?   No    Is the patient reluctant to leave their home because of a fear of falling?   No      Home Environment   Living Environment  Private residence    Living Arrangements  Children   54 y.o. son   Available Help at Discharge  Family      Prior Function   Level of Independence  Independent    Vocation  Full time employment    Vocation Requirements  HR at [employer]  She walks 40 min 2x/week      Cognition   Overall Cognitive Status  Within Functional Limits for tasks assessed      Posture/Postural Control   Posture/Postural Control  Postural limitations    Postural Limitations  Rounded Shoulders;Forward head      ROM / Strength   AROM / PROM / Strength  AROM;Strength      AROM   AROM Assessment Site  Shoulder    Right/Left Shoulder  Right;Left  Right Shoulder Extension  48 Degrees    Right Shoulder Flexion  152 Degrees    Right Shoulder ABduction  154 Degrees    Right Shoulder Internal Rotation  60 Degrees    Right Shoulder External Rotation  87 Degrees    Left Shoulder Extension  48 Degrees    Left Shoulder Flexion  153 Degrees    Left Shoulder ABduction  141 Degrees    Left Shoulder Internal Rotation  62 Degrees    Left Shoulder External Rotation  90 Degrees      Strength   Overall Strength  Within functional limits for tasks performed        LYMPHEDEMA/ONCOLOGY QUESTIONNAIRE - 06/10/18 1128      Type   Cancer Type  Left breast cancer      Lymphedema Assessments   Lymphedema Assessments  Upper extremities      Right Upper Extremity Lymphedema   10 cm Proximal to Olecranon Process  42.4 cm    Olecranon Process  33 cm    10 cm Proximal to Ulnar Styloid Process  28.4 cm    Just Proximal to Ulnar Styloid Process  21.5 cm    Across Hand at [employer]  21.3 cm    At [place] of 2nd Digit  7.5 cm      Left  Upper Extremity Lymphedema   10 cm Proximal to Olecranon Process  42.2 cm    Olecranon Process  33.5 cm    10 cm Proximal to Ulnar Styloid Process  28.3 cm    Just Proximal to Ulnar Styloid Process  21.5 cm    Across Hand at [employer]  21.3 cm    At [place] of 2nd Digit  7.4 cm          Quick Dash - 06/10/18 0001    Open a tight or new jar  No difficulty    Do heavy household chores (wash walls, wash floors)  No difficulty    Carry a shopping bag or briefcase  No difficulty    Wash your back  No difficulty    Use a knife to cut food  No difficulty    Recreational activities in which you take some force or impact through your arm, shoulder, or hand (golf, hammering, tennis)  No difficulty    During the past week, to what extent has your arm, shoulder or hand problem interfered with your normal social activities with family, friends, neighbors, or groups?  Not at all    During the past week, to what extent has your arm, shoulder or hand problem limited your work or other regular daily activities  Not at all    Arm, shoulder, or hand pain.  None    Tingling (pins and needles) in your arm, shoulder, or hand  None    Difficulty Sleeping  No difficulty    DASH Score  0 %        Objective measurements completed on examination: See above findings.       Patient was instructed today in a home exercise program today for post op shoulder range of motion. These included active assist shoulder flexion in sitting, scapular retraction, wall walking with shoulder abduction, and hands behind head external rotation.  She was encouraged to do these twice a day, holding 3 seconds and repeating 5 times when permitted by her physician.     PT Education - 06/10/18 1132    Education Details  Lymphedema risk  reduction and post op shoulder ROM HEP    Person(s) Educated  Patient    Methods  Explanation;Demonstration;Handout    Comprehension  Returned demonstration;Verbalized understanding           PT Long Term Goals - 06/10/18 1137      PT LONG TERM GOAL #1   Title  Patient will demonstrate she has returned to baseline following surgery related to shoulder ROM and function.    Time  [facility] Goals - 06/10/18 1137      Patient will be able to verbalize understanding of pertinent lymphedema risk reduction practices relevant to her diagnosis specifically related to skin care.   Time  1    Period  Days    Status  Achieved      Patient will be able to return demonstrate and/or verbalize understanding of the post-op home exercise program related to regaining shoulder range of motion.   Time  1    Period  Days    Status  Achieved      Patient will be able to verbalize understanding of the importance of attending the postoperative After Breast Cancer Class for further lymphedema risk reduction education and therapeutic exercise.   Time  1    Period  Days    Status  Achieved            Plan - 06/10/18 1133    Clinical Impression Statement  Patient was diagnosed on 05/15/2018 with left grade III invasive ductal carcinoma breast cancer. It measures 9 mm and is located in the upper inner quadrant. It is ER positive, PR negative, and HER2 negative with a Ki67 of 80%. Other medical problems include diabetes, hypertension, and morbid obesity. Her multidisciplinary medical team met prior to her assessments to determine a recommended treatment plan. She is planning to have a left lumpectomy and sentinel node biopsy followed by Oncotype testing, radiation, and anti-estrogen therapy. She will benefit from a post op PT visit to reassess and determine needs.    Stability/Clinical Decision Making  Stable/Uncomplicated    Clinical Decision Making  Low    Rehab Potential  Excellent    PT Frequency  --   Eval and 1 f/u visit   PT Treatment/Interventions  ADLs/Self Care Home Management;Patient/family education;Therapeutic exercise    PT Next  Visit Plan  Will reassess 3-4 weeks after surgery    PT Home Exercise Plan  Post op shoulder ROM HEP    Consulted and Agree with Plan of Care  Patient       Patient will benefit from skilled therapeutic intervention in order to improve the following deficits and impairments:  Decreased range of motion, Impaired UE functional use, Pain, Decreased knowledge of precautions, Postural dysfunction  Visit Diagnosis: Malignant neoplasm of upper-inner quadrant of left breast in female, estrogen receptor positive ([place]) - Plan: PT plan of care cert/re-cert  Abnormal posture - Plan: PT plan of care cert/re-cert   Patient will follow up at outpatient cancer rehab 3-4 weeks following surgery.  If the patient requires physical therapy at that time, a specific plan will be dictated and sent to the referring physician for approval. The patient was educated today on appropriate basic range of motion exercises to begin post operatively and the importance of attending the After Breast Cancer class following surgery.  Patient was educated today on lymphedema risk reduction practices as it  pertains to recommendations that will benefit the patient immediately following surgery.  She verbalized good understanding.      Problem List Patient Active Problem List   Diagnosis Date Noted  . Morbid obesity with BMI of 40.0-44.9, adult ([place]) 06/10/2018  . Malignant neoplasm of upper-inner quadrant of left breast in female, estrogen receptor positive ([place]) 06/08/2018  . Right shoulder pain 06/21/2016  . Strain of calf muscle, initial encounter 05/30/2016   Annia Friendly, PT 06/10/18 11:39 AM  [place], [place], [address] Phone: [phone]   Fax:  [phone]  Name: ANALEIGH ARIES MRN: [id number] Date of Birth: 1965/12/03

## 2018-06-11 ENCOUNTER — Telehealth: Payer: Self-pay | Admitting: Oncology

## 2018-06-11 ENCOUNTER — Other Ambulatory Visit: Payer: Self-pay | Admitting: General Surgery

## 2018-06-11 DIAGNOSIS — C50211 Malignant neoplasm of upper-inner quadrant of right female breast: Secondary | ICD-10-CM

## 2018-06-11 DIAGNOSIS — Z170 Estrogen receptor positive status [ER+]: Secondary | ICD-10-CM

## 2018-06-11 NOTE — Telephone Encounter (Signed)
I left a message regarding 7/8

## 2018-06-12 ENCOUNTER — Other Ambulatory Visit: Payer: Self-pay | Admitting: General Surgery

## 2018-06-15 ENCOUNTER — Encounter: Payer: Self-pay | Admitting: General Practice

## 2018-06-15 NOTE — Progress Notes (Signed)
[facility] Psychosocial Distress Screening Spiritual Care  Met with Cassandra Sanders in [facility] to introduce [facility] team/resources, reviewing distress screen per protocol.  The patient scored a 5 on the Psychosocial Distress Thermometer which indicates moderate distress. Also assessed for distress and other psychosocial needs.   ONCBCN DISTRESS SCREENING 06/15/2018  Screening Type Initial Screening  Distress experienced in past week (1-10) 5  Emotional problem type Nervousness/Anxiety  Referral to support programs Yes   Cassandra Sanders reports very strong family support.   Follow up needed: No. Per pt, no other needs or questions at this time, but she is aware of ongoing Team/programming availability and plans to f/u with chaplain as needed/desired. Please also page if needs arise or circumstances change. Thank you!   [place], [place], [facility] Pager [phone] Voicemail [phone]

## 2018-06-17 ENCOUNTER — Telehealth: Payer: Self-pay

## 2018-06-17 NOTE — Telephone Encounter (Signed)
Nutrition Assessment  Reason for Assessment:  Pt attended Breast Clinic on 06/10/2018 and nutrition packet was given  ASSESSMENT:  53 year old female with new diagnosis of breast cancer. Past medical history of HTN, DM.  Planning lumpectomy,  Oncotype, testing, possible adjuvant chemotherapy and radiation therapy.   RD working remotely.  Spoke with patient via phone this am to introduce self and service at [facility].  Patient reports normal appetite   Medications:  Metformin, januvia  Labs: glucoe 190, creatinine 1.14  Anthropometrics:   Height: 69 inches Weight: 284 lb BMI: 41  Stable weight   NUTRITION DIAGNOSIS: Food and nutrition related knowledge deficit related to new diagnosis of breast cancer as evidenced by no prior need for nutrition related information.  INTERVENTION:   Discussed plant based diet with patient. Encouraged good sources of protein.   Questions addressed Contact information provided and encouraged patient to reach out to RD with future questions or concerns    MONITORING, EVALUATION, and GOAL: Pt will consume a healthy plant based diet to maintain lean body mass throughout treatment.   Damyen Knoll B. Zenia Resides, [place], [place] Registered Dietitian [phone] (pager)

## 2018-06-18 ENCOUNTER — Telehealth: Payer: Self-pay | Admitting: *Deleted

## 2018-06-18 DIAGNOSIS — R87610 Atypical squamous cells of undetermined significance on cytologic smear of cervix (ASC-US): Secondary | ICD-10-CM | POA: Diagnosis not present

## 2018-06-18 DIAGNOSIS — N888 Other specified noninflammatory disorders of cervix uteri: Secondary | ICD-10-CM | POA: Diagnosis not present

## 2018-06-18 DIAGNOSIS — R87810 Cervical high risk human papillomavirus (HPV) DNA test positive: Secondary | ICD-10-CM | POA: Diagnosis not present

## 2018-06-18 NOTE — Telephone Encounter (Signed)
  Oncology Nurse Navigator Documentation  Navigator Location: [facility] (06/18/18 1200)   )Navigator Encounter Type: Telephone;MDC Follow-up (06/18/18 1200) Telephone: Outgoing Call;Clinic/MDC Follow-up (06/18/18 1200)     Surgery Date: 06/26/18 (06/18/18 1200)           Treatment Initiated Date: 06/26/18 (06/18/18 1200)                                Time Spent with Patient: 15 (06/18/18 1200)

## 2018-06-19 ENCOUNTER — Telehealth: Payer: Self-pay | Admitting: *Deleted

## 2018-06-19 ENCOUNTER — Other Ambulatory Visit: Payer: Self-pay

## 2018-06-19 ENCOUNTER — Encounter (HOSPITAL_BASED_OUTPATIENT_CLINIC_OR_DEPARTMENT_OTHER): Payer: Self-pay | Admitting: *Deleted

## 2018-06-19 NOTE — Telephone Encounter (Signed)
Spoke to patient to give oncotype results and make appointment for Dr. Jana Hakim for 06/22/18 at 1130am.

## 2018-06-19 NOTE — Telephone Encounter (Signed)
Received oncotype results of 56/39%.  Left message on patient's voicemail for a return phone to get her an appointment to discuss with Dr. Jana Hakim.

## 2018-06-21 NOTE — Progress Notes (Signed)
[facility]  [phone] [phone]     ID: HAGEN TIDD DOB: [birth year]  MR#: [id number]  VFI#:[id number]  Patient Care Team: Cassandra Odor, PA-C as PCP - General (Nurse Practitioner) Cassandra Germany, RN as Oncology Nurse Navigator Cassandra Kaufmann, RN as Oncology Nurse Navigator Cassandra Sanders, Cassandra Dad, MD as Consulting Physician (Oncology) Rolm Bookbinder, MD as Consulting Physician (General Surgery) Cassandra Rudd, MD as Consulting Physician (Radiation Oncology) Cassandra Pearson, MD as Consulting Physician (Obstetrics and Gynecology) Cassandra Cruel, MD OTHER MD:  CHIEF COMPLAINT: Breast cancer, moderately estrogen receptor positive  CURRENT TREATMENT: Awaiting definitive surgery   HISTORY OF CURRENT ILLNESS: From the original intake note:  Cassandra Sanders had routine screening mammography on 05/15/2018 at [facility] showing a possible abnormality in the left breast. She underwent bilateral diagnostic mammography with tomography and left breast ultrasonography at [facility] on 06/02/2018 showing: breast density category B; suspicious approximate 0.9 cm mass involving the upper inner quadrant of the left breast at posterior depth which account for the screening mammographic finding; no pathologic left axillary lymphadenopathy.  Accordingly on 06/04/2018 she proceeded to biopsy of the left breast area in question. The pathology from this procedure ([id number]) showed: invasive ductal carcinoma, grade 3. Prognostic indicators significant for: estrogen receptor, 60% positive with moderate staining intensity and progesterone receptor, 0% negative. Proliferation marker Ki67 at 80%. HER2 negative by immunohistochemistry (0).  The patient's subsequent history is as detailed below.   INTERVAL HISTORY: Cassandra Sanders was seen today for follow-up and treatment of her moderately estrogen receptor positive breast cancer.  Since her last visit here, her  Oncotype DX results have returned. The Oncotype DX score was 56 predicting a risk of outside the breast recurrence over the next 9 years of 39 % if the patient's only systemic therapy is tamoxifen for 5 years.  It also predicts significant benefit from chemotherapy.  She is scheduled for her lumpectomy on 06/26/2018 with Dr. Donne Sanders.    REVIEW OF SYSTEMS: Cassandra Sanders says that she is feeling emotional, but yet not emotional at the same time.  She is praying and meditating and "centering".  For exercise, she likes to walk. She notes that she had a small fall yesterday after she missed a step at a restaurant and her elbow caught her in the chest, so she is a little sore. The patient denies unusual headaches, visual changes, nausea, vomiting, or dizziness. There has been no unusual cough, phlegm production, or pleurisy. This been no change in bowel or bladder habits. The patient denies unexplained fatigue or unexplained weight loss, bleeding, rash, or fever. A detailed review of systems was otherwise noncontributory.    PAST MEDICAL HISTORY: Past Medical History:  Diagnosis Date   Breast cancer ([place]) 06/04/2018   left breast   Diabetes mellitus without complication ([place])    Hypertension     PAST SURGICAL HISTORY: No past surgical history on file.  FAMILY HISTORY: No family history on file. Patient's father was in his 47s when he was murdered. Patient's mother is currently (as of 06/2018) at age 69. The patient denies a family hx of breast or ovarian cancer. She has 3 brothers and no biological sisters.  GYNECOLOGIC HISTORY:  Patient's last menstrual period was 06/18/2013. Menarche: 53 years old Age at first live birth: 54 years old [place] P 1 LMP June 2015 Contraceptive used from age [age] with no complications HRT none  Hysterectomy? no BSO? 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 She is engaged to Cassandra Sanders. She lives at home with her son Cassandra Sanders, age 71, who works here in [place] as a Museum/gallery exhibitions officer.  The patient attends [employer].    ADVANCED DIRECTIVES: not in place; at the 06/10/2018 visit the patient was given the appropriate documents to complete and notarize at her discretion.  She intends to name her son is her healthcare power of attorney   HEALTH MAINTENANCE: Social History   Tobacco Use   Smoking status: Never Smoker   Smokeless tobacco: Never Used  Substance Use Topics   Alcohol use: No   Drug use: No     Colonoscopy: 2016  PAP: 05/15/2018, negative  Bone density: never done   No Known Allergies  Current Outpatient Medications  Medication Sig Dispense Refill   aspirin EC 81 MG tablet Take 81 mg by mouth daily.     JANUVIA 100 MG tablet daily.      lisinopril (PRINIVIL,ZESTRIL) 10 MG tablet Take 10 mg by mouth daily.  2   METFORMIN HCL PO Take 1,000 mg by mouth daily.      No current facility-administered medications for this visit.     OBJECTIVE: Morbidly obese African-American woman who appears stated age  55:   06/22/18 1508  BP: (!) 173/105  Pulse: 81  Resp: 18  Temp: 98 F (36.7 C)  SpO2: 100%     Body mass index is 42.9 kg/m.   Wt Readings from Last 3 Encounters:  06/22/18 290 lb 8 oz (131.8 kg)  06/10/18 284 lb 3.2 oz (128.9 kg)  06/20/16 285 lb (129.3 kg)      ECOG FS:1 - Symptomatic but completely ambulatory  Sclerae unicteric, EOMs intact Wearing a mask No cervical or supraclavicular adenopathy Lungs no rales or rhonchi Heart regular rate and rhythm Abd soft, nontender, positive bowel sounds MSK no focal spinal tenderness, no upper extremity lymphedema Neuro: nonfocal, well oriented, appropriate affect Breasts: No palpable masses noted in either breast.  Both axillae are benign  LAB RESULTS:  CMP     Component Value Date/Time   NA 139 06/10/2018 0859   K 3.8 06/10/2018 0859   CL 104  06/10/2018 0859   CO2 25 06/10/2018 0859   GLUCOSE 190 (H) 06/10/2018 0859   BUN 16 06/10/2018 0859   CREATININE 1.14 (H) 06/10/2018 0859   CALCIUM 9.1 06/10/2018 0859   PROT 7.8 06/10/2018 0859   ALBUMIN 4.0 06/10/2018 0859   AST 13 (L) 06/10/2018 0859   ALT 17 06/10/2018 0859   ALKPHOS 105 06/10/2018 0859   BILITOT 0.3 06/10/2018 0859   GFRNONAA 54 (L) 06/10/2018 0859   GFRAA >60 06/10/2018 0859    No results found for: TOTALPROTELP, ALBUMINELP, A1GS, A2GS, BETS, BETA2SER, GAMS, MSPIKE, SPEI  No results found for: KPAFRELGTCHN, LAMBDASER, KAPLAMBRATIO  Lab Results  Component Value Date   WBC 6.1 06/10/2018   NEUTROABS 3.1 06/10/2018   HGB 11.8 (L) 06/10/2018   HCT 36.5 06/10/2018   MCV 91.3 06/10/2018   PLT 303 06/10/2018    _0 @  No results found for: LABCA2  No components found for: QDIYME158  No results for input(s): INR in the last 168 hours.  No results found for: LABCA2  No results found for: XEN407  No results found for: WKG881  No results found for: JSR159  No results found for: CA2729  No components found for: HGQUANT  No results found for: CEA1 / No results found for: CEA1   No results found for: AFPTUMOR  No results found for: CHROMOGRNA  No results found for: PSA1  No visits with results within 3 Day(s) from this visit.  Latest known visit with results is:  Appointment on 06/10/2018  Component Date Value Ref Range Status   Sodium 06/10/2018 139  135 - 145 mmol/L Final   Potassium 06/10/2018 3.8  3.5 - 5.1 mmol/L Final   Chloride 06/10/2018 104  98 - 111 mmol/L Final   CO2 06/10/2018 25  22 - 32 mmol/L Final   Glucose, Bld 06/10/2018 190* 70 - 99 mg/dL Final   BUN 06/10/2018 16  6 - 20 mg/dL Final   Creatinine 06/10/2018 1.14* 0.44 - 1.00 mg/dL Final   Calcium 06/10/2018 9.1  8.9 - 10.3 mg/dL Final   Total Protein 06/10/2018 7.8  6.5 - 8.1 g/dL Final   Albumin 06/10/2018 4.0  3.5 - 5.0 g/dL Final   AST  06/10/2018 13* 15 - 41 U/L Final   ALT 06/10/2018 17  0 - 44 U/L Final   Alkaline Phosphatase 06/10/2018 105  38 - 126 U/L Final   Total Bilirubin 06/10/2018 0.3  0.3 - 1.2 mg/dL Final   GFR, Est Non Af Am 06/10/2018 54* >60 mL/min Final   GFR, Est AFR Am 06/10/2018 >60  >60 mL/min Final   Anion gap 06/10/2018 10  5 - 15 Final   Performed at [facility], [place] [address], [place], [place] [address]   WBC Count 06/10/2018 6.1  4.0 - 10.5 K/uL Final   RBC 06/10/2018 4.00  3.87 - 5.11 MIL/uL Final   Hemoglobin 06/10/2018 11.8* 12.0 - 15.0 g/dL Final   HCT 06/10/2018 36.5  36.0 - 46.0 % Final   MCV 06/10/2018 91.3  80.0 - 100.0 fL Final   MCH 06/10/2018 29.5  26.0 - 34.0 pg Final   MCHC 06/10/2018 32.3  30.0 - 36.0 g/dL Final   RDW 06/10/2018 12.7  11.5 - 15.5 % Final   Platelet Count 06/10/2018 303  150 - 400 K/uL Final   nRBC 06/10/2018 0.0  0.0 - 0.2 % Final   Neutrophils Relative % 06/10/2018 50  % Final   Neutro Abs 06/10/2018 3.1  1.7 - 7.7 K/uL Final   Lymphocytes Relative 06/10/2018 42  % Final   Lymphs Abs 06/10/2018 2.6  0.7 - 4.0 K/uL Final   Monocytes Relative 06/10/2018 5  % Final   Monocytes Absolute 06/10/2018 0.3  0.1 - 1.0 K/uL Final   Eosinophils Relative 06/10/2018 3  % Final   Eosinophils Absolute 06/10/2018 0.2  0.0 - 0.5 K/uL Final   Basophils Relative 06/10/2018 0  % Final   Basophils Absolute 06/10/2018 0.0  0.0 - 0.1 K/uL Final   Immature Granulocytes 06/10/2018 0  % Final   Abs Immature Granulocytes 06/10/2018 0.02  0.00 - 0.07 K/uL Final   Performed at [facility], [place] [address] [place], [place] [address]    (this displays the last labs from the last 3 days)  No results found for: TOTALPROTELP, ALBUMINELP, A1GS, A2GS, BETS, BETA2SER, GAMS, MSPIKE, SPEI (this displays SPEP labs)  No results found for: KPAFRELGTCHN, LAMBDASER, KAPLAMBRATIO (kappa/lambda light chains)  No  results found for: HGBA, HGBA2QUANT, HGBFQUANT, HGBSQUAN (Hemoglobinopathy evaluation)   No results found for: LDH  No results found for: IRON, TIBC, IRONPCTSAT (Iron and TIBC)  No results found for: FERRITIN  Urinalysis No results found for: COLORURINE, APPEARANCEUR, LABSPEC, PHURINE, GLUCOSEU, HGBUR, BILIRUBINUR, KETONESUR, PROTEINUR, UROBILINOGEN, NITRITE, LEUKOCYTESUR   STUDIES: US Breast Ltd Uni Left Inc Axilla  Result Date: 06/02/2018 CLINICAL DATA:  Recall from screening mammography with tomosynthesis, possible mass or developing asymmetry involving the UPPER INNER QUADRANT of the LEFT breast at POSTERIOR depth. EXAM: DIGITAL DIAGNOSTIC LEFT MAMMOGRAM WITH TOMO ULTRASOUND LEFT BREAST COMPARISON:  Previous exam(s). ACR Breast Density Category b: There are scattered areas of fibroglandular density. FINDINGS: Tomosynthesis and synthesized spot-compression CC and MLO views of the area of concern in the LEFT breast were obtained. The developing asymmetry in the UPPER INNER QUADRANT of the LEFT breast at POSTERIOR depth persists on the spot compression images, though I do not identify a discrete mass and there is no associated architectural distortion or suspicious calcifications. Targeted LEFT breast ultrasound is performed, showing a hypoechoic mass with irregular margins at the 10 o'clock position approximately 12 cm from nipple at POSTERIOR depth, measuring approximately 0.6 x 0.9 x 0.7 cm, demonstrating no posterior characteristics and demonstrating peripheral power Doppler flow, corresponding to the screening mammographic finding. Sonographic evaluation of the LEFT axilla demonstrates no pathologic lymphadenopathy. IMPRESSION: 1. Suspicious approximate 0.9 cm mass involving the UPPER INNER QUADRANT of the LEFT breast at POSTERIOR depth which accounts for the screening mammographic finding. 2. No pathologic LEFT axillary lymphadenopathy. RECOMMENDATION: Ultrasound-guided core needle biopsy of  LEFT breast mass. The ultrasound core needle biopsy procedure was discussed with the patient and her questions were answered. She has agreed to proceed and the biopsy has been scheduled for Thursday, May 28. I have discussed the findings and recommendations with the patient. If applicable, a reminder letter will be sent to the patient regarding the next appointment. BI-RADS CATEGORY  4: Suspicious. Electronically Signed   By: Evangeline Dakin M.D.   On: 06/02/2018 15:37   Mm Diag Breast Tomo Uni Left  Result Date: 06/02/2018 CLINICAL DATA:  Recall from screening mammography with tomosynthesis, possible mass or developing asymmetry involving the UPPER INNER QUADRANT of the LEFT breast at POSTERIOR depth. EXAM: DIGITAL DIAGNOSTIC LEFT MAMMOGRAM WITH TOMO ULTRASOUND LEFT BREAST COMPARISON:  Previous exam(s). ACR Breast Density Category b: There are scattered areas of fibroglandular density. FINDINGS: Tomosynthesis and synthesized spot-compression CC and MLO views of the area of concern in the LEFT breast were obtained. The developing asymmetry in the UPPER INNER QUADRANT of the LEFT breast at POSTERIOR depth persists on the spot compression images, though I do not identify a discrete mass and there is no associated architectural distortion or suspicious calcifications. Targeted LEFT breast ultrasound is performed, showing a hypoechoic mass with irregular margins at the 10 o'clock position approximately 12 cm from nipple at POSTERIOR depth, measuring approximately 0.6 x 0.9 x 0.7 cm, demonstrating no posterior characteristics and demonstrating peripheral power Doppler flow, corresponding to the screening mammographic finding. Sonographic evaluation of the LEFT axilla demonstrates no pathologic lymphadenopathy. IMPRESSION: 1. Suspicious approximate 0.9 cm mass involving the UPPER INNER QUADRANT of the LEFT breast at POSTERIOR depth which accounts for the screening mammographic finding. 2. No pathologic LEFT axillary  lymphadenopathy. RECOMMENDATION: Ultrasound-guided core needle biopsy of LEFT breast mass. The ultrasound core needle biopsy procedure was discussed with the patient and her questions were answered. She has agreed to proceed and the biopsy has been scheduled for Thursday, May 28. I have discussed the findings and recommendations with the patient. If applicable, a reminder letter will be sent to the patient regarding the next appointment.  BI-RADS CATEGORY  4: Suspicious. Electronically Signed   By: Evangeline Dakin M.D.   On: 06/02/2018 15:37   Mm Clip Placement Left  Result Date: 06/04/2018 CLINICAL DATA:  Post biopsy mammogram of the left breast for clip placement. EXAM: DIAGNOSTIC LEFT MAMMOGRAM POST ULTRASOUND BIOPSY COMPARISON:  Previous exam(s). FINDINGS: Mammographic images were obtained following ultrasound guided biopsy of a mass in the left breast at 10 o'clock. The ribbon shaped biopsy marking clip is well positioned at the site of biopsy in the upper inner left breast. IMPRESSION: Appropriate positioning of the ribbon shaped biopsy marking clip in the upper inner left breast. Final Assessment: Post Procedure Mammograms for Marker Placement Electronically Signed   By: Ammie Ferrier M.D.   On: 06/04/2018 13:42   [place] Lt Breast Bx W Loc Dev 1st Lesion Img Bx Spec US Guide  Addendum Date: 06/05/2018   ADDENDUM REPORT: 06/05/2018 12:07 ADDENDUM: Pathology revealed GRADE III INVASIVE DUCTAL CARCINOMA of the Left breast, 10:00. This was found to be concordant by Dr. Ammie Ferrier. Pathology results were discussed with the patient by telephone. The patient reported doing well after the biopsy with tenderness at the site. Post biopsy instructions and care were reviewed and questions were answered. The patient was encouraged to call [facility] for any additional concerns. The patient was referred to [facility] on June 10, 2018. Pathology results reported by Terie Purser, RN on 06/05/2018. Electronically Signed   By: Ammie Ferrier M.D.   On: 06/05/2018 12:07   Result Date: 06/05/2018 CLINICAL DATA:  53 year old female presenting for ultrasound-guided biopsy of a left breast mass. EXAM: ULTRASOUND GUIDED LEFT BREAST CORE NEEDLE BIOPSY COMPARISON:  Previous exam(s). FINDINGS: I met with the patient and we discussed the procedure of ultrasound-guided biopsy, including benefits and alternatives. We discussed the high likelihood of a successful procedure. We discussed the risks of the procedure, including infection, bleeding, tissue injury, clip migration, and inadequate sampling. Informed written consent was given. The usual time-out protocol was performed immediately prior to the procedure. Lesion quadrant: Upper inner quadrant Using sterile technique and 1% Lidocaine as local anesthetic, under direct ultrasound visualization, a 14 gauge spring-loaded device was used to perform biopsy of a mass in the left breast at 10 o'clock using a inferolateral approach. At the conclusion of the procedure a ribbon shaped tissue marker clip was deployed into the biopsy cavity. Follow up 2 view mammogram was performed and dictated separately. IMPRESSION: Ultrasound guided biopsy of a left breast mass at 10 o'clock. No apparent complications. Electronically Signed: By: Ammie Ferrier M.D. On: 06/04/2018 16:31    ELIGIBLE FOR AVAILABLE RESEARCH PROTOCOL: no  ASSESSMENT: 53 y.o. [place] woman status post left breast upper inner quadrant biopsy 06/04/2018 for a clinical T1b N0, stage IB invasive ductal carcinoma, grade 3, estrogen receptor moderately positive, progesterone receptor negative, with an MIB-1 of 80%, and HER-2 nonamplified  (1) definitive surgery pending  (2) Oncotype score of 56 predicts a risk of outside the breast recurrence over the next 9 years of nearly 40% if the patient's only systemic  therapy is antiestrogens for 5 years.  It also predicts a significant benefit from chemotherapy.  (3) unless the tumor proves larger than anticipated or node positive therapy will consist of cyclophosphamide and docetaxel x4; otherwise we will consider standard AC/T  (4) adjuvant radiation to follow chemotherapy  (5) antiestrogens at the completion of local treatment.  PLAN: I gave Amandajo a copy of an Oncotype report so she could see where her value falls, namely outside the range.  While the risk of metastatic recurrence is high in the absence of chemotherapy, the good news is that this assay does predict a good response to chemotherapy.  She is very agreeable to receiving it.  At this point the data we have indicates a T1BN0 tumor.  If she ends up to have a larger tumor or a positive note, I would be less comfortable with cyclophosphamide and docetaxel, and would rather suggest cyclophosphamide and doxorubicin followed by Taxol.  We discussed that today and we also discussed the possible toxicities side effects and complications of TC chemotherapy including the rare case of permanent alopecia.    She was offered participation in the [facility].  Unfortunately that is something insurance does not pay and the patient would have to arrange for herself.  Bona is not motivated at this point  She will have her surgery this Friday and she will have a port placed at the same time.  She will return early July to meet with our chemotherapy teaching nurse and also with me.  We will review the final pathology at that point and set her up for her chemotherapy to start July 17, 2018  He knows to call for any other issue that may develop before the next visit.   Cassandra Cruel, MD   06/22/2018 5:13 PM Medical Oncology and Hematology [facility] [address] [place], [place] [address] Tel. [phone]    Fax. [phone]  I, Jacqualyn Posey am acting as a Education administrator for Cassandra Cruel, MD.   I, Lurline Del MD, have reviewed the above documentation for accuracy and completeness, and I agree with the above.

## 2018-06-22 ENCOUNTER — Other Ambulatory Visit: Payer: Self-pay

## 2018-06-22 ENCOUNTER — Inpatient Hospital Stay (HOSPITAL_BASED_OUTPATIENT_CLINIC_OR_DEPARTMENT_OTHER): Payer: Federal, State, Local not specified - PPO | Admitting: Oncology

## 2018-06-22 VITALS — BP 173/105 | HR 81 | Temp 98.0°F | Resp 18 | Ht 69.0 in | Wt 290.5 lb

## 2018-06-22 DIAGNOSIS — C50212 Malignant neoplasm of upper-inner quadrant of left female breast: Secondary | ICD-10-CM

## 2018-06-22 DIAGNOSIS — Z170 Estrogen receptor positive status [ER+]: Secondary | ICD-10-CM | POA: Diagnosis not present

## 2018-06-22 DIAGNOSIS — Z6841 Body Mass Index (BMI) 40.0 and over, adult: Secondary | ICD-10-CM

## 2018-06-22 NOTE — Progress Notes (Signed)
START ON PATHWAY REGIMEN - Breast     A cycle is every 21 days:     Docetaxel      Cyclophosphamide   **Always confirm dose/schedule in your pharmacy ordering system**  Patient Characteristics: Postoperative without Neoadjuvant Therapy (Pathologic Staging), Invasive Disease, Adjuvant Therapy, HER2 Negative/Unknown/Equivocal, ER Positive, Node Negative, pT1a-c, pN0/N38m or pT2 or Higher, pN0, Oncotype High Risk (? 26) Therapeutic Status: Postoperative without Neoadjuvant Therapy (Pathologic Staging) AJCC Grade: G2 AJCC N Category: pN0 AJCC M Category: cM0 ER Status: Positive (+) AJCC 8 Stage Grouping: IA HER2 Status: Negative (-) Oncotype Dx Recurrence Score: 559AJCC T Category: pT1b PR Status: Positive (+) Has this patient completed genomic testing<= Yes - Oncotype DX(R) Intent of Therapy: Curative Intent, Discussed with Patient

## 2018-06-23 ENCOUNTER — Other Ambulatory Visit (HOSPITAL_COMMUNITY)
Admission: RE | Admit: 2018-06-23 | Discharge: 2018-06-23 | Disposition: A | Discharge status: Home / Self Care | Payer: Federal, State, Local not specified - PPO | Source: Ambulatory Visit | Attending: General Surgery | Admitting: General Surgery

## 2018-06-23 ENCOUNTER — Encounter: Payer: Self-pay | Admitting: Oncology

## 2018-06-23 ENCOUNTER — Encounter (HOSPITAL_BASED_OUTPATIENT_CLINIC_OR_DEPARTMENT_OTHER)
Admission: RE | Admit: 2018-06-23 | Discharge: 2018-06-23 | Disposition: A | Discharge status: Home / Self Care | Payer: Federal, State, Local not specified - PPO | Source: Ambulatory Visit | Attending: General Surgery | Admitting: General Surgery

## 2018-06-23 DIAGNOSIS — Z1159 Encounter for screening for other viral diseases: Secondary | ICD-10-CM | POA: Insufficient documentation

## 2018-06-23 DIAGNOSIS — Z01810 Encounter for preprocedural cardiovascular examination: Secondary | ICD-10-CM | POA: Insufficient documentation

## 2018-06-23 NOTE — Progress Notes (Signed)
Pt given CHG bath with instructions prior to surgery. Also given Gatorade G2 drink with instructions for completion by 0415 hrs DOS. NPO otherwise. Pt verbalized understanding and given time to ask questions.

## 2018-06-24 LAB — NOVEL CORONAVIRUS, NAA (HOSP ORDER, SEND-OUT TO REF LAB; TAT 18-24 HRS): SARS-CoV-2, NAA: NOT DETECTED

## 2018-06-25 ENCOUNTER — Telehealth: Payer: Self-pay | Admitting: Oncology

## 2018-06-25 ENCOUNTER — Ambulatory Visit
Admission: RE | Admit: 2018-06-25 | Discharge: 2018-06-25 | Disposition: A | Discharge status: Home / Self Care | Payer: Federal, State, Local not specified - PPO | Source: Ambulatory Visit | Attending: General Surgery | Admitting: General Surgery

## 2018-06-25 ENCOUNTER — Other Ambulatory Visit: Payer: Self-pay

## 2018-06-25 DIAGNOSIS — C50212 Malignant neoplasm of upper-inner quadrant of left female breast: Secondary | ICD-10-CM | POA: Diagnosis not present

## 2018-06-25 DIAGNOSIS — Z170 Estrogen receptor positive status [ER+]: Secondary | ICD-10-CM

## 2018-06-25 DIAGNOSIS — C50211 Malignant neoplasm of upper-inner quadrant of right female breast: Secondary | ICD-10-CM

## 2018-06-25 NOTE — Telephone Encounter (Signed)
I talk with patient regarding schedule  

## 2018-06-26 ENCOUNTER — Ambulatory Visit (HOSPITAL_BASED_OUTPATIENT_CLINIC_OR_DEPARTMENT_OTHER)
Admission: RE | Admit: 2018-06-26 | Discharge: 2018-06-26 | Disposition: A | Discharge status: Home / Self Care | Payer: Federal, State, Local not specified - PPO | Attending: General Surgery | Admitting: General Surgery

## 2018-06-26 ENCOUNTER — Ambulatory Visit (HOSPITAL_BASED_OUTPATIENT_CLINIC_OR_DEPARTMENT_OTHER): Payer: Federal, State, Local not specified - PPO | Admitting: Anesthesiology

## 2018-06-26 ENCOUNTER — Ambulatory Visit (HOSPITAL_COMMUNITY): Payer: Federal, State, Local not specified - PPO

## 2018-06-26 ENCOUNTER — Encounter (HOSPITAL_BASED_OUTPATIENT_CLINIC_OR_DEPARTMENT_OTHER)
Admission: RE | Disposition: A | Discharge status: Home / Self Care | Payer: Self-pay | Source: Home / Self Care | Attending: General Surgery

## 2018-06-26 ENCOUNTER — Ambulatory Visit
Admission: RE | Admit: 2018-06-26 | Discharge: 2018-06-26 | Disposition: A | Discharge status: Home / Self Care | Payer: Federal, State, Local not specified - PPO | Source: Ambulatory Visit | Attending: General Surgery | Admitting: General Surgery

## 2018-06-26 ENCOUNTER — Encounter (HOSPITAL_COMMUNITY)
Admission: RE | Admit: 2018-06-26 | Discharge: 2018-06-26 | Disposition: A | Discharge status: Home / Self Care | Payer: Federal, State, Local not specified - PPO | Source: Ambulatory Visit | Attending: General Surgery | Admitting: General Surgery

## 2018-06-26 DIAGNOSIS — D0512 Intraductal carcinoma in situ of left breast: Secondary | ICD-10-CM | POA: Insufficient documentation

## 2018-06-26 DIAGNOSIS — C50211 Malignant neoplasm of upper-inner quadrant of right female breast: Secondary | ICD-10-CM

## 2018-06-26 DIAGNOSIS — G709 Myoneural disorder, unspecified: Secondary | ICD-10-CM | POA: Diagnosis not present

## 2018-06-26 DIAGNOSIS — E119 Type 2 diabetes mellitus without complications: Secondary | ICD-10-CM | POA: Insufficient documentation

## 2018-06-26 DIAGNOSIS — Z6841 Body Mass Index (BMI) 40.0 and over, adult: Secondary | ICD-10-CM | POA: Diagnosis not present

## 2018-06-26 DIAGNOSIS — Z833 Family history of diabetes mellitus: Secondary | ICD-10-CM | POA: Diagnosis not present

## 2018-06-26 DIAGNOSIS — Z170 Estrogen receptor positive status [ER+]: Secondary | ICD-10-CM | POA: Diagnosis not present

## 2018-06-26 DIAGNOSIS — I10 Essential (primary) hypertension: Secondary | ICD-10-CM | POA: Diagnosis not present

## 2018-06-26 DIAGNOSIS — Z95828 Presence of other vascular implants and grafts: Secondary | ICD-10-CM

## 2018-06-26 DIAGNOSIS — Z8249 Family history of ischemic heart disease and other diseases of the circulatory system: Secondary | ICD-10-CM | POA: Diagnosis not present

## 2018-06-26 DIAGNOSIS — C50212 Malignant neoplasm of upper-inner quadrant of left female breast: Secondary | ICD-10-CM | POA: Diagnosis not present

## 2018-06-26 DIAGNOSIS — G8918 Other acute postprocedural pain: Secondary | ICD-10-CM | POA: Diagnosis not present

## 2018-06-26 DIAGNOSIS — C50912 Malignant neoplasm of unspecified site of left female breast: Secondary | ICD-10-CM | POA: Diagnosis not present

## 2018-06-26 DIAGNOSIS — Z452 Encounter for adjustment and management of vascular access device: Secondary | ICD-10-CM | POA: Diagnosis not present

## 2018-06-26 HISTORY — PX: PORTACATH PLACEMENT: SHX2246

## 2018-06-26 HISTORY — PX: BREAST LUMPECTOMY: SHX2

## 2018-06-26 HISTORY — PX: BREAST LUMPECTOMY WITH RADIOACTIVE SEED AND SENTINEL LYMPH NODE BIOPSY: SHX6550

## 2018-06-26 LAB — GLUCOSE, CAPILLARY
Glucose-Capillary: 150 mg/dL — ABNORMAL HIGH (ref 70–99)
Glucose-Capillary: 164 mg/dL — ABNORMAL HIGH (ref 70–99)

## 2018-06-26 SURGERY — BREAST LUMPECTOMY WITH RADIOACTIVE SEED AND SENTINEL LYMPH NODE BIOPSY
Anesthesia: General | Site: Breast | Laterality: Right

## 2018-06-26 MED ORDER — BUPIVACAINE HCL (PF) 0.25 % IJ SOLN
INTRAMUSCULAR | Status: DC | PRN
  Administered 2018-06-26: 7 mL
  Administered 2018-06-26: 6 mL

## 2018-06-26 MED ORDER — OXYCODONE HCL 5 MG PO TABS: 5.0000 mg | ORAL_TABLET | Freq: Four times a day (QID) | ORAL | 0 refills | Status: DC | PRN

## 2018-06-26 MED ORDER — SCOPOLAMINE 1 MG/3DAYS TD PT72
1.0000 | MEDICATED_PATCH | Freq: Once | TRANSDERMAL | Status: DC
  Administered 2018-06-26: 1.5 mg via TRANSDERMAL

## 2018-06-26 MED ORDER — OXYCODONE HCL 5 MG PO TABS
ORAL_TABLET | ORAL | Status: AC
  Filled 2018-06-26: qty 1

## 2018-06-26 MED ORDER — TECHNETIUM TC 99M SULFUR COLLOID FILTERED
1.0000 | Freq: Once | INTRAVENOUS | Status: AC | PRN
  Administered 2018-06-26: 1 via INTRADERMAL

## 2018-06-26 MED ORDER — GABAPENTIN 100 MG PO CAPS
100.0000 mg | ORAL_CAPSULE | ORAL | Status: AC
  Administered 2018-06-26: 100 mg via ORAL

## 2018-06-26 MED ORDER — HEPARIN (PORCINE) IN NACL 2-0.9 UNITS/ML
INTRAMUSCULAR | Status: AC | PRN
  Administered 2018-06-26: 1 via INTRAVENOUS

## 2018-06-26 MED ORDER — ROCURONIUM BROMIDE 100 MG/10ML IV SOLN
INTRAVENOUS | Status: DC | PRN
  Administered 2018-06-26: 40 mg via INTRAVENOUS

## 2018-06-26 MED ORDER — OXYCODONE HCL 5 MG/5ML PO SOLN: 5.0000 mg | Freq: Once | ORAL | Status: AC | PRN

## 2018-06-26 MED ORDER — MIDAZOLAM HCL 2 MG/2ML IJ SOLN
1.0000 mg | INTRAMUSCULAR | Status: DC | PRN
  Administered 2018-06-26: 07:00:00 2 mg via INTRAVENOUS

## 2018-06-26 MED ORDER — PHENYLEPHRINE HCL (PRESSORS) 10 MG/ML IV SOLN
INTRAVENOUS | Status: DC | PRN
  Administered 2018-06-26 (×2): 80 ug via INTRAVENOUS
  Administered 2018-06-26: 120 ug via INTRAVENOUS
  Administered 2018-06-26: 80 ug via INTRAVENOUS

## 2018-06-26 MED ORDER — ACETAMINOPHEN 500 MG PO TABS
ORAL_TABLET | ORAL | Status: AC
  Filled 2018-06-26: qty 2

## 2018-06-26 MED ORDER — SCOPOLAMINE 1 MG/3DAYS TD PT72
MEDICATED_PATCH | TRANSDERMAL | Status: AC
  Filled 2018-06-26: qty 1

## 2018-06-26 MED ORDER — FENTANYL CITRATE (PF) 100 MCG/2ML IJ SOLN
INTRAMUSCULAR | Status: AC
  Filled 2018-06-26: qty 2

## 2018-06-26 MED ORDER — KETOROLAC TROMETHAMINE 15 MG/ML IJ SOLN
15.0000 mg | INTRAMUSCULAR | Status: DC
  Administered 2018-06-26: 07:00:00 15 mg via INTRAVENOUS

## 2018-06-26 MED ORDER — ONDANSETRON HCL 4 MG/2ML IJ SOLN
INTRAMUSCULAR | Status: DC | PRN
  Administered 2018-06-26: 4 mg via INTRAVENOUS

## 2018-06-26 MED ORDER — HEPARIN SOD (PORK) LOCK FLUSH 100 UNIT/ML IV SOLN
INTRAVENOUS | Status: DC | PRN
  Administered 2018-06-26: 300 [IU] via INTRAVENOUS

## 2018-06-26 MED ORDER — ONDANSETRON HCL 4 MG/2ML IJ SOLN
INTRAMUSCULAR | Status: AC
  Filled 2018-06-26: qty 2

## 2018-06-26 MED ORDER — PROPOFOL 10 MG/ML IV BOLUS
INTRAVENOUS | Status: AC
  Filled 2018-06-26: qty 40

## 2018-06-26 MED ORDER — SUGAMMADEX SODIUM 500 MG/5ML IV SOLN
INTRAVENOUS | Status: DC | PRN
  Administered 2018-06-26: 400 mg via INTRAVENOUS

## 2018-06-26 MED ORDER — CHLORHEXIDINE GLUCONATE CLOTH 2 % EX PADS: 6.0000 | MEDICATED_PAD | Freq: Once | CUTANEOUS | Status: DC

## 2018-06-26 MED ORDER — GLYCOPYRROLATE 0.2 MG/ML IJ SOLN
INTRAMUSCULAR | Status: DC | PRN
  Administered 2018-06-26: 0.2 mg via INTRAVENOUS

## 2018-06-26 MED ORDER — ENSURE PRE-SURGERY PO LIQD: 296.0000 mL | Freq: Once | ORAL | Status: DC

## 2018-06-26 MED ORDER — DEXAMETHASONE SODIUM PHOSPHATE 10 MG/ML IJ SOLN
INTRAMUSCULAR | Status: AC
  Filled 2018-06-26: qty 1

## 2018-06-26 MED ORDER — FENTANYL CITRATE (PF) 100 MCG/2ML IJ SOLN
25.0000 ug | INTRAMUSCULAR | Status: DC | PRN
  Administered 2018-06-26: 10:00:00 50 ug via INTRAVENOUS

## 2018-06-26 MED ORDER — KETOROLAC TROMETHAMINE 15 MG/ML IJ SOLN
INTRAMUSCULAR | Status: AC
  Filled 2018-06-26: qty 1

## 2018-06-26 MED ORDER — CEFAZOLIN SODIUM-DEXTROSE 2-4 GM/100ML-% IV SOLN: 2.0000 g | INTRAVENOUS | Status: DC

## 2018-06-26 MED ORDER — GLYCOPYRROLATE PF 0.2 MG/ML IJ SOSY
PREFILLED_SYRINGE | INTRAMUSCULAR | Status: AC
  Filled 2018-06-26: qty 1

## 2018-06-26 MED ORDER — LIDOCAINE HCL (CARDIAC) PF 100 MG/5ML IV SOSY
PREFILLED_SYRINGE | INTRAVENOUS | Status: DC | PRN
  Administered 2018-06-26: 100 mg via INTRAVENOUS

## 2018-06-26 MED ORDER — PROPOFOL 10 MG/ML IV BOLUS
INTRAVENOUS | Status: DC | PRN
  Administered 2018-06-26: 200 mg via INTRAVENOUS
  Administered 2018-06-26: 100 mg via INTRAVENOUS

## 2018-06-26 MED ORDER — SUCCINYLCHOLINE CHLORIDE 20 MG/ML IJ SOLN
INTRAMUSCULAR | Status: DC | PRN
  Administered 2018-06-26: 140 mg via INTRAVENOUS

## 2018-06-26 MED ORDER — LACTATED RINGERS IV SOLN
INTRAVENOUS | Status: DC | PRN
  Administered 2018-06-26 (×2): via INTRAVENOUS

## 2018-06-26 MED ORDER — MIDAZOLAM HCL 2 MG/2ML IJ SOLN
INTRAMUSCULAR | Status: AC
  Filled 2018-06-26: qty 2

## 2018-06-26 MED ORDER — CEFAZOLIN SODIUM-DEXTROSE 2-4 GM/100ML-% IV SOLN
INTRAVENOUS | Status: AC
  Filled 2018-06-26: qty 100

## 2018-06-26 MED ORDER — GABAPENTIN 100 MG PO CAPS
ORAL_CAPSULE | ORAL | Status: AC
  Filled 2018-06-26: qty 1

## 2018-06-26 MED ORDER — LIDOCAINE 2% (20 MG/ML) 5 ML SYRINGE
INTRAMUSCULAR | Status: AC
  Filled 2018-06-26: qty 5

## 2018-06-26 MED ORDER — ONDANSETRON HCL 4 MG/2ML IJ SOLN: 4.0000 mg | Freq: Four times a day (QID) | INTRAMUSCULAR | Status: DC | PRN

## 2018-06-26 MED ORDER — FENTANYL CITRATE (PF) 100 MCG/2ML IJ SOLN
50.0000 ug | INTRAMUSCULAR | Status: AC | PRN
  Administered 2018-06-26: 50 ug via INTRAVENOUS
  Administered 2018-06-26 (×4): 25 ug via INTRAVENOUS

## 2018-06-26 MED ORDER — ACETAMINOPHEN 500 MG PO TABS
1000.0000 mg | ORAL_TABLET | ORAL | Status: AC
  Administered 2018-06-26: 1000 mg via ORAL

## 2018-06-26 MED ORDER — OXYCODONE HCL 5 MG PO TABS
5.0000 mg | ORAL_TABLET | Freq: Once | ORAL | Status: AC | PRN
  Administered 2018-06-26: 11:00:00 5 mg via ORAL

## 2018-06-26 MED ORDER — CEFAZOLIN SODIUM-DEXTROSE 2-4 GM/100ML-% IV SOLN
2.0000 g | INTRAVENOUS | Status: AC
  Administered 2018-06-26: 2 g via INTRAVENOUS

## 2018-06-26 MED ORDER — ROPIVACAINE HCL 5 MG/ML IJ SOLN
INTRAMUSCULAR | Status: DC | PRN
  Administered 2018-06-26: 25 mL via PERINEURAL

## 2018-06-26 SURGICAL SUPPLY — 76 items
ADH SKN CLS APL DERMABOND .7 (GAUZE/BANDAGES/DRESSINGS) ×4
APL PRP STRL LF DISP 70% ISPRP (MISCELLANEOUS) ×4
APL SKNCLS STERI-STRIP NONHPOA (GAUZE/BANDAGES/DRESSINGS) ×1
APPLIER CLIP 9.375 MED OPEN (MISCELLANEOUS) ×3
APR CLP MED 9.3 20 MLT OPN (MISCELLANEOUS) ×1
BAG DECANTER FOR FLEXI CONT (MISCELLANEOUS) ×3 IMPLANT
BENZOIN TINCTURE PRP APPL 2/3 (GAUZE/BANDAGES/DRESSINGS) ×3 IMPLANT
BINDER BREAST LRG (GAUZE/BANDAGES/DRESSINGS) IMPLANT
BINDER BREAST MEDIUM (GAUZE/BANDAGES/DRESSINGS) IMPLANT
BINDER BREAST XLRG (GAUZE/BANDAGES/DRESSINGS) IMPLANT
BINDER BREAST XXLRG (GAUZE/BANDAGES/DRESSINGS) ×3 IMPLANT
BLADE SURG 11 STRL SS (BLADE) ×3 IMPLANT
BLADE SURG 15 STRL LF DISP TIS (BLADE) ×2 IMPLANT
BLADE SURG 15 STRL SS (BLADE) ×3
CANISTER SUC SOCK COL 7IN (MISCELLANEOUS) IMPLANT
CANISTER SUCT 1200ML W/VALVE (MISCELLANEOUS) IMPLANT
CHLORAPREP W/TINT 26 (MISCELLANEOUS) ×6 IMPLANT
CLIP APPLIE 9.375 MED OPEN (MISCELLANEOUS) ×2 IMPLANT
CLIP VESOCCLUDE SM WIDE 6/CT (CLIP) ×3 IMPLANT
COVER BACK TABLE REUSABLE LG (DRAPES) ×3 IMPLANT
COVER MAYO STAND REUSABLE (DRAPES) ×3 IMPLANT
COVER PROBE 5X48 (MISCELLANEOUS) ×2
COVER PROBE W GEL 5X96 (DRAPES) ×3 IMPLANT
COVER WAND RF STERILE (DRAPES) ×3 IMPLANT
DECANTER SPIKE VIAL GLASS SM (MISCELLANEOUS) IMPLANT
DERMABOND ADVANCED (GAUZE/BANDAGES/DRESSINGS) ×2
DERMABOND ADVANCED .7 DNX12 (GAUZE/BANDAGES/DRESSINGS) ×4 IMPLANT
DRAPE C-ARM 42X72 X-RAY (DRAPES) ×3 IMPLANT
DRAPE LAPAROSCOPIC ABDOMINAL (DRAPES) ×6 IMPLANT
DRAPE UTILITY XL STRL (DRAPES) ×3 IMPLANT
DRSG TEGADERM 4X4.75 (GAUZE/BANDAGES/DRESSINGS) IMPLANT
ELECT COATED BLADE 2.86 ST (ELECTRODE) ×3 IMPLANT
ELECT REM PT RETURN 9FT ADLT (ELECTROSURGICAL) ×3
ELECTRODE REM PT RTRN 9FT ADLT (ELECTROSURGICAL) ×2 IMPLANT
GAUZE SPONGE 4X4 12PLY STRL LF (GAUZE/BANDAGES/DRESSINGS) ×3 IMPLANT
GLOVE BIO SURGEON STRL SZ7 (GLOVE) ×6 IMPLANT
GLOVE BIOGEL PI IND STRL 6.5 (GLOVE) ×2 IMPLANT
GLOVE BIOGEL PI IND STRL 7.5 (GLOVE) ×2 IMPLANT
GLOVE BIOGEL PI INDICATOR 6.5 (GLOVE) ×1
GLOVE BIOGEL PI INDICATOR 7.5 (GLOVE) ×1
GOWN STRL REUS W/ TWL LRG LVL3 (GOWN DISPOSABLE) ×4 IMPLANT
GOWN STRL REUS W/TWL LRG LVL3 (GOWN DISPOSABLE) ×6
HEMOSTAT ARISTA ABSORB 3G PWDR (HEMOSTASIS) IMPLANT
ILLUMINATOR WAVEGUIDE N/F (MISCELLANEOUS) ×3 IMPLANT
IV KIT MINILOC 20X1 SAFETY (NEEDLE) IMPLANT
KIT CVR 48X5XPRB PLUP LF (MISCELLANEOUS) ×2 IMPLANT
KIT MARKER MARGIN INK (KITS) ×3 IMPLANT
KIT PORT POWER 8FR ISP CVUE (Port) ×3 IMPLANT
LIGHT WAVEGUIDE WIDE FLAT (MISCELLANEOUS) IMPLANT
NDL SAFETY ECLIPSE 18X1.5 (NEEDLE) IMPLANT
NEEDLE HYPO 18GX1.5 SHARP (NEEDLE)
NEEDLE HYPO 25X1 1.5 SAFETY (NEEDLE) ×3 IMPLANT
NS IRRIG 1000ML POUR BTL (IV SOLUTION) IMPLANT
PACK BASIN DAY SURGERY FS (CUSTOM PROCEDURE TRAY) ×3 IMPLANT
PENCIL BUTTON HOLSTER BLD 10FT (ELECTRODE) ×3 IMPLANT
SLEEVE SCD COMPRESS KNEE MED (MISCELLANEOUS) ×3 IMPLANT
SPONGE LAP 4X18 RFD (DISPOSABLE) ×3 IMPLANT
STRIP CLOSURE SKIN 1/2X4 (GAUZE/BANDAGES/DRESSINGS) ×6 IMPLANT
SUT ETHILON 2 0 FS 18 (SUTURE) IMPLANT
SUT MNCRL AB 4-0 PS2 18 (SUTURE) ×9 IMPLANT
SUT MON AB 5-0 PS2 18 (SUTURE) IMPLANT
SUT PROLENE 2 0 SH DA (SUTURE) ×3 IMPLANT
SUT SILK 2 0 SH (SUTURE) ×3 IMPLANT
SUT SILK 2 0 TIES 17X18 (SUTURE) ×2
SUT SILK 2-0 18XBRD TIE BLK (SUTURE) ×2 IMPLANT
SUT VIC AB 2-0 SH 27 (SUTURE) ×3
SUT VIC AB 2-0 SH 27XBRD (SUTURE) ×2 IMPLANT
SUT VIC AB 3-0 SH 27 (SUTURE) ×9
SUT VIC AB 3-0 SH 27X BRD (SUTURE) ×6 IMPLANT
SUT VIC AB 5-0 PS2 18 (SUTURE) IMPLANT
SYR 5ML LUER SLIP (SYRINGE) ×3 IMPLANT
SYR CONTROL 10ML LL (SYRINGE) ×3 IMPLANT
TOWEL GREEN STERILE FF (TOWEL DISPOSABLE) ×3 IMPLANT
TRAY FAXITRON CT DISP (TRAY / TRAY PROCEDURE) ×3 IMPLANT
TUBE CONNECTING 20X1/4 (TUBING) IMPLANT
YANKAUER SUCT BULB TIP NO VENT (SUCTIONS) IMPLANT

## 2018-06-26 NOTE — Transfer of Care (Signed)
Immediate Anesthesia Transfer of Care Note  Patient: Lonnetta Kniskern Cheek  Procedure(s) Performed: LEFT BREAST LUMPECTOMY WITH RADIOACTIVE SEED AND LEFT AXILLARY SENTINEL LYMPH NODE BIOPSY (Left Breast) INSERTION PORT-A-CATH WITH ULTRASOUND (Right )  Patient Location: PACU  Anesthesia Type:General  Level of Consciousness: awake, alert  and oriented  Airway & Oxygen Therapy: Patient Spontanous Breathing and Patient connected to face mask oxygen  Post-op Assessment: Report given to RN and Post -op Vital signs reviewed and stable  Post vital signs: Reviewed and stable  Last Vitals:  Vitals Value Taken Time  BP    Temp    Pulse 87 06/26/18 0922  Resp 17 06/26/18 0922  SpO2 97 % 06/26/18 0922  Vitals shown include unvalidated device data.  Last Pain:  Vitals:   06/26/18 0650  TempSrc: Oral  PainSc: 1          Complications: No apparent anesthesia complications

## 2018-06-26 NOTE — Anesthesia Procedure Notes (Signed)
Anesthesia Regional Block: Pectoralis block   Pre-Anesthetic Checklist: ,, timeout performed, Correct Patient, Correct Site, Correct Laterality, Correct Procedure, Correct Position, site marked, Risks and benefits discussed,  Surgical consent,  Pre-op evaluation,  At surgeon's request and post-op pain management  Laterality: Left  Prep: chloraprep       Needles:  Injection technique: Single-shot  Needle Type: Echogenic Needle     Needle Length: 9cm  Needle Gauge: 21     Additional Needles:   Narrative:  Start time: 06/26/2018 7:07 AM End time: 06/26/2018 7:11 AM Injection made incrementally with aspirations every 5 mL.  Performed by: Personally  Anesthesiologist: Albertha Ghee, MD  Additional Notes: Pt tolerated the procedure well.

## 2018-06-26 NOTE — Anesthesia Procedure Notes (Signed)
Procedure Name: Intubation Performed by: Verita Lamb, CRNA Pre-anesthesia Checklist: Patient identified, Emergency Drugs available, Suction available, Patient being monitored and Timeout performed Patient Re-evaluated:Patient Re-evaluated prior to induction Oxygen Delivery Method: Circle system utilized Preoxygenation: Pre-oxygenation with 100% oxygen Induction Type: IV induction and Inhalational induction Laryngoscope Size: Glidescope Grade View: Grade I Tube size: 7.0 mm Number of attempts: 1 Airway Equipment and Method: Stylet Placement Confirmation: ETT inserted through vocal cords under direct vision,  positive ETCO2,  CO2 detector and breath sounds checked- equal and bilateral Tube secured with: Tape Dental Injury: Teeth and Oropharynx as per pre-operative assessment  Difficulty Due To: Difficult Airway- due to large tongue and Difficult Airway- due to dentition Comments: Late entry:  LMA 4 inserted easily.  Noted some bleeding and tooth from bottom front missing.  We used succ.  Removed the LMA carefully and the tooth was noted (the entire tooth with root came out).  The patient's dentition is very poor.  There are multiple very loose teeth on the bottom front. Dr. Marcie Bal carefully intubated the patient with glidescope using a 7 ett, mac 3 blade.  We are concerned that the patient may loose additional teeth when extubated at the end of the case because the dentition is so poor if she bites down on the ett or a bite block she may loose the other loose teeth.  We will make sure the patient is completely awake prior to extubating to protect her airway.  Jack Quarto CRNA

## 2018-06-26 NOTE — Anesthesia Preprocedure Evaluation (Signed)
Anesthesia Evaluation  Patient identified by MRN, date of birth, ID band Patient awake    Reviewed: Allergy & Precautions, H&P , NPO status , Patient's Chart, lab work & pertinent test results  Airway Mallampati: II   Neck ROM: full    Dental   Pulmonary neg pulmonary ROS,    breath sounds clear to auscultation       Cardiovascular hypertension,  Rhythm:regular Rate:Normal     Neuro/Psych  Neuromuscular disease    GI/Hepatic   Endo/Other  diabetes, Type 2Morbid obesity  Renal/GU      Musculoskeletal   Abdominal   Peds  Hematology   Anesthesia Other Findings   Reproductive/Obstetrics                             Anesthesia Physical Anesthesia Plan  ASA: II  Anesthesia Plan: General   Post-op Pain Management:  Regional for Post-op pain   Induction: Intravenous  PONV Risk Score and Plan: 3 and Ondansetron, Dexamethasone, Midazolam, Treatment may vary due to age or medical condition and Scopolamine patch - Pre-op  Airway Management Planned: LMA  Additional Equipment:   Intra-op Plan:   Post-operative Plan:   Informed Consent: I have reviewed the patients History and Physical, chart, labs and discussed the procedure including the risks, benefits and alternatives for the proposed anesthesia with the patient or authorized representative who has indicated his/her understanding and acceptance.       Plan Discussed with: CRNA, Anesthesiologist and Surgeon  Anesthesia Plan Comments:         Anesthesia Quick Evaluation

## 2018-06-26 NOTE — Interval H&P Note (Signed)
History and Physical Interval Note:  04/15/500 5:61 AM  Cassandra Sanders  has presented today for surgery, with the diagnosis of LEFT BREAST CANCER.  The various methods of treatment have been discussed with the patient and family. After consideration of risks, benefits and other options for treatment, the patient has consented to  Procedure(s): LEFT BREAST LUMPECTOMY WITH RADIOACTIVE SEED AND LEFT AXILLARY SENTINEL LYMPH NODE BIOPSY (Left) INSERTION PORT-A-CATH WITH ULTRASOUND (N/A) as a surgical intervention.  The patient's history has been reviewed, patient examined, no change in status, stable for surgery.  I have reviewed the patient's chart and labs.  Questions were answered to the patient's satisfaction.     Rolm Bookbinder

## 2018-06-26 NOTE — Progress Notes (Signed)
Assisted Dr. Marcie Bal with left, ultrasound guided, axillary block. Side rails up, monitors on throughout procedure. See vital signs in flow sheet. Tolerated Procedure well.

## 2018-06-26 NOTE — H&P (Signed)
53 yof referred by Dr Jana Hakim for new left breast cancer. she has no prior history or family history. this was 3 week delayed screening mm due to covid. she has no mass or dc. she has a left breast upper inner quadrant mass present. there was an upper inner quadrant mass measuring 9x7x6 mm, axillary [place] negative. 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 she lives in [place] with her son. 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 she has dm and htn, active   Past Surgical History Tawni Pummel, RN; 06/10/2018 7:24 AM) No pertinent past surgical history   Diagnostic Studies History Tawni Pummel, RN; 06/10/2018 7:24 AM) Colonoscopy  1-5 years ago Mammogram  within last year Pap Smear  1-5 years ago  Medication History Tawni Pummel, RN; 06/10/2018 7:24 AM) Medications Reconciled  Social History Tawni Pummel, RN; 06/10/2018 7:24 AM) No alcohol use  No caffeine use  No drug use  Tobacco use  Never smoker.  Family History Tawni Pummel, RN; 06/10/2018 7:24 AM) Diabetes Mellitus  Mother. Hypertension  Mother.  Pregnancy / Birth History Tawni Pummel, RN; 06/10/2018 7:24 AM) Age at menarche  43 years. Age of menopause  79-54 Gravida  1 Irregular periods  Maternal age  37-30 Para  1  Other Problems Tawni Pummel, RN; 06/10/2018 7:24 AM) Diabetes Mellitus     Review of Systems Sunday Spillers Ledford RN; 06/10/2018 7:24 AM) General Not Present- Appetite Loss, Chills, Fatigue, Fever, Night Sweats, Weight Gain and Weight Loss. Skin Not Present- Change in Wart/Mole, Dryness, Hives, Jaundice, New Lesions, Non-Healing Wounds, Rash and Ulcer. HEENT Not Present- Earache, Hearing Loss, Hoarseness, Nose Bleed, Oral Ulcers, Ringing in the Ears, Seasonal Allergies, Sinus Pain, Sore Throat, Visual Disturbances, Wears glasses/contact lenses and Yellow Eyes. Respiratory Not Present- Bloody sputum, Chronic Cough, Difficulty Breathing, Snoring and Wheezing. Breast Not  Present- Breast Mass, Breast Pain, Nipple Discharge and Skin Changes. Cardiovascular Not Present- Chest Pain, Difficulty Breathing Lying Down, Leg Cramps, Palpitations, Rapid Heart Rate, Shortness of Breath and Swelling of Extremities. Gastrointestinal Not Present- Abdominal Pain, Bloating, Bloody Stool, Change in Bowel Habits, Chronic diarrhea, Constipation, Difficulty Swallowing, Excessive gas, Gets full quickly at meals, Hemorrhoids, Indigestion, Nausea, Rectal Pain and Vomiting. Female Genitourinary Not Present- Frequency, Nocturia, Painful Urination, Pelvic Pain and Urgency. Musculoskeletal Not Present- Back Pain, Joint Pain, Joint Stiffness, Muscle Pain, Muscle Weakness and Swelling of Extremities. Neurological Not Present- Decreased Memory, Fainting, Headaches, Numbness, Seizures, Tingling, Tremor, Trouble walking and Weakness. Psychiatric Not Present- Anxiety, Bipolar, Change in Sleep Pattern, Depression, Fearful and Frequent crying. Endocrine Present- New Diabetes. Not Present- Cold Intolerance, Excessive Hunger, Hair Changes, Heat Intolerance and Hot flashes. Hematology Not Present- Blood Thinners, Easy Bruising, Excessive bleeding, Gland problems, HIV and Persistent Infections.   Physical Exam Rolm Bookbinder MD; 06/10/2018 10:08 AM) General Mental Status-Alert.  Integumentary Global Assessment Upon inspection and palpation of skin surfaces of the - Head/Face: no rashes, ulcers, lesions or evidence of photo damage. No palpable nodules or masses.  Head and Neck Trachea-midline. Thyroid Gland Characteristics - normal size and consistency.  Eye Sclera/Conjunctiva - Bilateral-No scleral icterus.  Chest and Lung Exam Chest and lung exam reveals -normal excursion with symmetric chest walls and quiet, even and easy respiratory effort with no use of accessory muscles.  Breast Nipples-No Discharge. Breast Lump-No Palpable Breast Mass.  Cardiovascular Cardiovascular  examination reveals -normal heart sounds, regular rate and rhythm with no murmurs.  Abdomen Note: soft no  hepatomegaly   Neurologic Neurologic evaluation reveals -alert and oriented x 3 with no impairment of recent or remote memory.  Lymphatic Head & Neck  General Head & Neck Lymphatics: Bilateral - Description - Normal. Axillary  General Axillary Region: Bilateral - Description - Normal. Note: no Inger adenopathy     Assessment & Plan Rolm Bookbinder MD; 06/10/2018 2:02 PM)  BREAST CANCER OF UPPER-INNER QUADRANT OF LEFT FEMALE BREAST (C50.212) Story: Left breast seed guided lumpectomy, left axillary sn biopsy,port placement for adjuvant chemo we discussed staging and pathophysiology of breast cancer and all available treatment options we discussed sentinel node biopsy with radiotracer. discussed risk of lymphedema, rom issues, seroma. will do PT afterwards as well. discussed smal risk of returning for alnd depending on final pathology. we also discussed lumpectomy with radiotherapy vs mastectomy. they are equivalent for cancer survival and recurrence. discussed risk of positive margin needing to return to or for margin clearance. we discussed no bearing on chemotherapy this is independent of local control. discussed risks, recovery. she would like to proceed with lumpectomy will also place port

## 2018-06-26 NOTE — Anesthesia Procedure Notes (Signed)
Procedure Name: LMA Insertion Performed by: Cassandra Lamb, CRNA Pre-anesthesia Checklist: Patient identified, Emergency Drugs available, Suction available, Patient being monitored and Timeout performed Patient Re-evaluated:Patient Re-evaluated prior to induction Oxygen Delivery Method: Circle system utilized Preoxygenation: Pre-oxygenation with 100% oxygen Induction Type: IV induction LMA: LMA inserted LMA Size: 4.0 Number of attempts: 1 Placement Confirmation: positive ETCO2,  CO2 detector and breath sounds checked- equal and bilateral Dental Injury: Dental damage  Comments: Late entry:  LMA 4 inserted easily.  Noted some bleeding and tooth from bottom front missing.  We used succ.  Removed the LMA carefully and the tooth was noted (the entire tooth with root came out).  The patient's dentition is very poor.  There are multiple very loose teeth on the bottom front. Dr. Marcie Bal carefully intubated the patient with glidescope using a 7 ett, mac 3 blade.  We are concerned that the patient may loose additional teeth when extubated at the end of the case because the dentition is so poor if she bites down on the ett or a bite block she may loose the other loose teeth.  We will make sure the patient is completely awake prior to extubating to protect her airway.  Jack Quarto CRNA

## 2018-06-26 NOTE — Op Note (Signed)
Preoperative diagnosisclinical stage I left breast cancer, high oncotype Postoperative diagnosis: same as above Procedure:  1.right ij US guided powerport insertion 2. Left seed guided lumpectomy 3. Left deep axillary sentinel node biopsy Surgeon: Dr Serita Grammes EBL: minimal Anes: general  Specimens 1. Left breast lumpectomy marked with paint 2. Left breast posterior margin marked short superior, long lateral double deep 3. Left deep axillary sentinel nodes counts of 6962 Complications none Drains none Sponge count correct Dispo to pacu stable  Indications: This is a42 yof with high oncotype and clinical stage I left breast cancer. We discussed all options and elected to proceed with lumpectomy, sn biopsy. She will have port placed due to oncotype and has been seen by med oncology. She had a seed placed prior to beginning and I have those images.  Procedure: After informed consent was obtained the patient was taken to the operating room. She had undergone a pectoral block and injected with technetium in standard periareolar fashion. She was given antibiotics. Sequential compression devices were on her legs. She was then placed under general anesthesia. Then she was prepped and draped in the standard sterile surgical fashion. Surgical timeout was then performed.  Ithenused the ultrasound to identify the right internal jugular vein. I went below here prior neck incision from fusion. I then accessed the vein using the ultrasound.This aspirated blood. I then placed the wire. This was confirmed by fluoroscopy and ultrasound to be in the correct position.I then infiltrated marcaine and made anincision. I created a pocket.I tunneled the line between the 2 sites.I then dilated the tract and placed the dilator assembly with the sheath. This was done under fluoroscopy. I then removed the sheath and dilator. The wire was also removed. The line was then pulled back to be in the  venacava. There was no ectopy.  I hooked this up to the port. I sutured this into place with 2-0 Prolene in 2 places. This aspirated blood and flushed easily.This was confirmed with a final fluoroscopy. I then closed this with 2-0 Vicryl and 4-0 Monocryl.Dermabond was placed on both the incisions.dressings were placed  I then identified the seed in the upper inner left breast.  I was unable to make a periareolar incision as the seed was so high in breastI elected to make a curvilinear incision overlying the seed.  I infiltrated Marcaine and made an incision.  I then remove the seed and the surrounding tissue with an attempt to get a clear margin.  Hemostasis was observed.  I placed some clips.  I did a mammogram in the operating room confirming removal of the clip and the seed. I removed the posterior margin so this is muscle.  I eventually mobilized the breast tissue and closed this with 2-0 Vicryl, 3-0 Vicryl, and 4-0 Monocryl.  Glue and Steri-Strips were applied.  I then made an incision below the axillary hairline after infiltration with Marcaine.  I carried this through the axillary fascia.  There were several radioactive nodes that I excised.   There was no more radioactivity present in the axilla.  Hemostasis was observed.   I closed this with 2-0 Vicryl, 3-0 Vicryl, and 4-0 Monocryl.  Glue and Steri-Strips were applied.  She tolerated this well was extubated and transferred to the recovery room stable.

## 2018-06-26 NOTE — Discharge Instructions (Signed)
[facility] °Office Phone Number [phone] ° °POST OP INSTRUCTIONS °Take 400 mg of ibuprofen every 8 hours or 650 mg tylenol every 6 hours for next 72 hours then as needed. Use ice several times daily also. °Always review your discharge instruction sheet given to you by the facility where your surgery was performed. ° °IF YOU HAVE DISABILITY OR FAMILY LEAVE FORMS, YOU MUST BRING THEM TO THE OFFICE FOR PROCESSING.  DO NOT GIVE THEM TO YOUR DOCTOR. ° °1. A prescription for pain medication may be given to you upon discharge.  Take your pain medication as prescribed, if needed.  If narcotic pain medicine is not needed, then you may take acetaminophen (Tylenol), naprosyn (Alleve) or ibuprofen (Advil) as needed. °2. Take your usually prescribed medications unless otherwise directed °3. If you need a refill on your pain medication, please contact your pharmacy.  They will contact our office to request authorization.  Prescriptions will not be filled after 5pm or on week-ends. °4. You should eat very light the first 24 hours after surgery, such as soup, crackers, pudding, etc.  Resume your normal diet the day after surgery. °5. Most patients will experience some swelling and bruising in the breast.  Ice packs and a good support bra will help.  Wear the breast binder provided or a sports bra for 72 hours day and night.  After that wear a sports bra during the day until you return to the office. Swelling and bruising can take several days to resolve.  °6. It is common to experience some constipation if taking pain medication after surgery.  Increasing fluid intake and taking a stool softener will usually help or prevent this problem from occurring.  A mild laxative (Milk of Magnesia or Miralax) should be taken according to package directions if there are no bowel movements after 48 hours. °7. Unless discharge instructions indicate otherwise, you may remove your bandages 48 hours after surgery and you may  shower at that time.  You may have steri-strips (small skin tapes) in place directly over the incision.  These strips should be left on the skin for 7-10 days and will come off on their own.  If your surgeon used skin glue on the incision, you may shower in 24 hours.  The glue will flake off over the next 2-3 weeks.  Any sutures or staples will be removed at the office during your follow-up visit. °8. ACTIVITIES:  You may resume regular daily activities (gradually increasing) beginning the next day.  Wearing a good support bra or sports bra minimizes pain and swelling.  You may have sexual intercourse when it is comfortable. °a. You may drive when you no longer are taking prescription pain medication, you can comfortably wear a seatbelt, and you can safely maneuver your car and apply brakes. °b. RETURN TO WORK:  ______________________________________________________________________________________ °9. You should see your doctor in the office for a follow-up appointment approximately two weeks after your surgery.  Your doctor’s nurse will typically make your follow-up appointment when she calls you with your pathology report.  Expect your pathology report 3-4 business days after your surgery.  You may call to check if you do not hear from us after three days. °10. OTHER INSTRUCTIONS: _______________________________________________________________________________________________ _____________________________________________________________________________________________________________________________________ °_____________________________________________________________________________________________________________________________________ °_____________________________________________________________________________________________________________________________________ ° °WHEN TO CALL DR WAKEFIELD: °1. Fever over 101.0 °2. Nausea and/or vomiting. °3. Extreme swelling or bruising. °4. Continued bleeding from  incision. °5. Increased pain, redness, or drainage from the incision. ° °The clinic staff is available to   answer your questions during regular business hours.  Please dont hesitate to call and ask to speak to one of the nurses for clinical concerns.  If you have a medical emergency, go to the nearest emergency room or call 911.  A surgeon from [facility] is always on call at the hospital.  For further questions, please visit [email or web address]  Post Anesthesia Home Care Instructions  Activity: Get plenty of rest for the remainder of the day. A responsible individual must stay with you for 24 hours following the procedure.  For the next 24 hours, DO NOT: -Drive a car -Paediatric nurse -Drink alcoholic beverages -Take any medication unless instructed by your physician -Make any legal decisions or sign important papers.  Meals: Start with liquid foods such as gelatin or soup. Progress to regular foods as tolerated. Avoid greasy, spicy, heavy foods. If nausea and/or vomiting occur, drink only clear liquids until the nausea and/or vomiting subsides. Call your physician if vomiting continues.  Special Instructions/Symptoms: Your throat may feel dry or sore from the anesthesia or the breathing tube placed in your throat during surgery. If this causes discomfort, gargle with warm salt water. The discomfort should disappear within 24 hours.  If you had a scopolamine patch placed behind your ear for the management of post- operative nausea and/or vomiting:  1. The medication in the patch is effective for 72 hours, after which it should be removed.  Wrap patch in a tissue and discard in the trash. Wash hands thoroughly with soap and water. 2. You may remove the patch earlier than 72 hours if you experience unpleasant side effects which may include dry mouth, dizziness or visual disturbances. 3. Avoid touching the patch. Wash your hands with soap and water after contact with  the patch.

## 2018-06-27 NOTE — Anesthesia Postprocedure Evaluation (Signed)
Anesthesia Post Note  Patient: Kasumi Ditullio Cheek  Procedure(s) Performed: LEFT BREAST LUMPECTOMY WITH RADIOACTIVE SEED AND LEFT AXILLARY SENTINEL LYMPH NODE BIOPSY (Left Breast) INSERTION PORT-A-CATH WITH ULTRASOUND (Right )     Patient location during evaluation: PACU Anesthesia Type: General Level of consciousness: awake and alert Pain management: pain level controlled Vital Signs Assessment: post-procedure vital signs reviewed and stable Respiratory status: spontaneous breathing, nonlabored ventilation, respiratory function stable and patient connected to nasal cannula oxygen Cardiovascular status: blood pressure returned to baseline and stable Postop Assessment: no apparent nausea or vomiting Anesthetic complications: no Comments: Pt has poor dentition with multiple loose teeth in bottom front.  During placement of LMA at beginning of anesthetic, one of the front lower teeth came out and was carefully removed from the airway.  Post-operatively, the patient expressed understanding of this happening and stated that she's aware of her chronically loose teeth and has plans to have them all removed.    Last Vitals:  Vitals:   06/26/18 1015 06/26/18 1040  BP: (!) 141/87 (!) 159/87  Pulse: 83 80  Resp: 18 16  Temp:  36.8 C  SpO2: 92% 100%    Last Pain:  Vitals:   06/26/18 1040  TempSrc: Oral  PainSc: [place]

## 2018-06-29 ENCOUNTER — Encounter: Payer: Self-pay | Admitting: *Deleted

## 2018-06-29 ENCOUNTER — Encounter (HOSPITAL_BASED_OUTPATIENT_CLINIC_OR_DEPARTMENT_OTHER): Payer: Self-pay | Admitting: General Surgery

## 2018-06-29 NOTE — Addendum Note (Signed)
Addendum  created 06/29/18 1254 by Tawni Millers, CRNA   Charge Capture section accepted

## 2018-07-03 ENCOUNTER — Other Ambulatory Visit: Payer: Self-pay | Admitting: Oncology

## 2018-07-06 ENCOUNTER — Encounter (HOSPITAL_COMMUNITY): Payer: Self-pay

## 2018-07-07 NOTE — Progress Notes (Signed)
[place]  [phone] [phone]     ID: Cassandra Sanders DOB: [birth year]  MR#: [id number]  FMB#:[id number]  Patient Care Team: Lennie Odor, PA-C as PCP - General (Nurse Practitioner) Rockwell Germany, RN as Oncology Nurse Navigator Mauro Kaufmann, RN as Oncology Nurse Navigator Akim Watkinson, Virgie Dad, MD as Consulting Physician (Oncology) Rolm Bookbinder, MD as Consulting Physician (General Surgery) Kyung Rudd, MD as Consulting Physician (Radiation Oncology) Marylynn Pearson, MD as Consulting Physician (Obstetrics and Gynecology) Chauncey Cruel, MD OTHER MD:  CHIEF COMPLAINT: Breast cancer, moderately estrogen receptor positive  CURRENT TREATMENT: Adjuvant chemotherapy   HISTORY OF CURRENT ILLNESS: From the original intake note:  Cassandra Sanders had routine screening mammography on 05/15/2018 at [facility] showing a possible abnormality in the left breast. She underwent bilateral diagnostic mammography with tomography and left breast ultrasonography at [facility] on 06/02/2018 showing: breast density category B; suspicious approximate 0.9 cm mass involving the upper inner quadrant of the left breast at posterior depth which account for the screening mammographic finding; no pathologic left axillary lymphadenopathy.  Accordingly on 06/04/2018 she proceeded to biopsy of the left breast area in question. The pathology from this procedure ([id number]) showed: invasive ductal carcinoma, grade 3. Prognostic indicators significant for: estrogen receptor, 60% positive with moderate staining intensity and progesterone receptor, 0% negative. Proliferation marker Ki67 at 80%. HER2 negative by immunohistochemistry (0).  The patient's subsequent history is as detailed below.   INTERVAL HISTORY: Cassandra Sanders returns today for follow-up and treatment of her moderately estrogen receptor positive breast cancer.  Since her last visit here, she  underwent a left breast lumpectomy on 06/26/2018. The pathology from this procedure showed ([id number]): Invasive ductal carcinoma grade iii/iii, spanning 1.5 cm. Prognostic indicators significant for: estrogen receptor, 0% negative and progesterone receptor, 0% negative. HER2 negative (1+) by immunohistochemistry.  Additional biopsies were performed on the left breast and left axilla on the same day. The pathology from this procedure showed ([id number]):  2. Breast, excision, left additional posterior margin - benign breast parenchyma. - there is no evidence of malignancy. - see comment. 3. Lymph node, sentinel, biopsy - there is no evidence of carcinoma in 1 of 1 lymph node (0/1). 4. Lymph node, sentinel, biopsy - there is no evidence of carcinoma in 1 of 1 lymph node (0/1). 5. Lymph node, sentinel, biopsy   - there is no evidence of carcinoma in 1 of 1 lymph node (0/1).  She had a PowerPort placed at the same time as her breast surgery.   She is scheduled to have some dental repairs and a tooth pulled on on 07/15/2018, but she is thinking about rescheduling it.       REVIEW OF SYSTEMS: Cassandra Sanders will be working from home through her chemotherapy treatment. She has decided against the cold cap for chemotherapy. The patient denies unusual headaches, visual changes, nausea, vomiting, or dizziness. There has been no unusual cough, phlegm production, or pleurisy. This been no change in bowel or bladder habits. The patient denies unexplained fatigue or unexplained weight loss, bleeding, rash, or fever. A detailed review of systems was otherwise noncontributory.    PAST MEDICAL HISTORY: Past Medical History:  Diagnosis Date  . Breast cancer ([place]) 06/04/2018   left breast  . Diabetes mellitus without complication ([place])   . Hypertension     PAST SURGICAL HISTORY: Past Surgical History:  Procedure Laterality Date  . BREAST LUMPECTOMY WITH RADIOACTIVE SEED AND SENTINEL LYMPH NODE BIOPSY  Left  06/26/2018   Procedure: LEFT BREAST LUMPECTOMY WITH RADIOACTIVE SEED AND LEFT AXILLARY SENTINEL LYMPH NODE BIOPSY;  Surgeon: Rolm Bookbinder, MD;  Location: [facility];  Service: General;  Laterality: Left;  . PORTACATH PLACEMENT Right 06/26/2018   Procedure: INSERTION PORT-A-CATH WITH ULTRASOUND;  Surgeon: Rolm Bookbinder, MD;  Location: [facility];  Service: General;  Laterality: Right;    FAMILY HISTORY: No family history on file. Patient's father was in his 18s when he was murdered. Patient's mother is currently (as of 06/2018) at age 45. The patient denies a family hx of breast or ovarian cancer. She has 3 brothers and no biological sisters.  GYNECOLOGIC HISTORY:  Patient's last menstrual period was 06/18/2013. Menarche: 53 years old Age at first live birth: 53 years old [place] P 1 LMP June 2015 Contraceptive used from age [age] with no complications HRT none  Hysterectomy? no BSO? no   SOCIAL HISTORY: (updated 08/07/8297)  Cassandra Sanders is currently working as a Magazine features editor at the [employer]. She is engaged to [facility]. She lives at home with her son Cassandra Sanders, age 53, who works here in [place] as a Museum/gallery exhibitions officer.  The patient attends [employer].    ADVANCED DIRECTIVES: not in place; at the 06/10/2018 visit the patient was given the appropriate documents to complete and notarize at her discretion.  She intends to name her son is her healthcare power of attorney   HEALTH MAINTENANCE: Social History   Tobacco Use  . Smoking status: Never Smoker  . Smokeless tobacco: Never Used  Substance Use Topics  . Alcohol use: No  . Drug use: No     Colonoscopy: 2016  PAP: 05/15/2018, negative  Bone density: never done   No Known Allergies  Current Outpatient Medications  Medication Sig Dispense Refill  . aspirin EC 81 MG tablet Take 81 mg by mouth daily.    Marland Kitchen dexamethasone (DECADRON) 4 MG tablet Take 2 tablets (8  mg total) by mouth 2 (two) times daily. Start the day before Taxotere. Then again the day after chemo for 3 days. 30 tablet 1  . JANUVIA 100 MG tablet daily.     Marland Kitchen lidocaine-prilocaine (EMLA) cream Apply to affected area once 30 g 3  . lisinopril (PRINIVIL,ZESTRIL) 10 MG tablet Take 10 mg by mouth daily.  2  . loratadine (CLARITIN) 10 MG tablet Take 1 tablet (10 mg total) by mouth daily. 90 tablet 1  . METFORMIN HCL PO Take 1,000 mg by mouth daily.     Marland Kitchen oxyCODONE (OXY IR/ROXICODONE) 5 MG immediate release tablet Take 1 tablet (5 mg total) by mouth every 6 (six) hours as needed for moderate pain, severe pain or breakthrough pain. 12 tablet 0  . prochlorperazine (COMPAZINE) 10 MG tablet Take 1 tablet (10 mg total) by mouth every 6 (six) hours as needed (Nausea or vomiting). 30 tablet 1   No current facility-administered medications for this visit.     OBJECTIVE: Morbidly obese African-American woman in no acute distress  Vitals:   07/08/18 1625  BP: (!) 171/90  Pulse: 97  Resp: 18  Temp: 98.8 F (37.1 C)  SpO2: 99%     Body mass index is 42.59 kg/m.   Wt Readings from Last 3 Encounters:  07/08/18 288 lb 6.4 oz (130.8 kg)  06/26/18 289 lb 14.5 oz (131.5 kg)  06/22/18 290 lb 8 oz (131.8 kg)      ECOG FS:1 - Symptomatic but completely  ambulatory  Sclerae unicteric, pupils round and equal Wearing a mask No cervical or supraclavicular adenopathy Lungs no rales or rhonchi Heart regular rate and rhythm Abd soft, nontender, positive bowel sounds MSK no focal spinal tenderness, no upper extremity lymphedema Neuro: nonfocal, well oriented, appropriate affect Breasts: The right breast is benign.  The left breast is status post recent lumpectomy.  The cosmetic result is excellent.  There is no dehiscence, erythema, or swelling.  Both axillae are benign.   LAB RESULTS:  CMP     Component Value Date/Time   NA 139 06/10/2018 0859   K 3.8 06/10/2018 0859   CL 104 06/10/2018 0859    CO2 25 06/10/2018 0859   GLUCOSE 190 (H) 06/10/2018 0859   BUN 16 06/10/2018 0859   CREATININE 1.14 (H) 06/10/2018 0859   CALCIUM 9.1 06/10/2018 0859   PROT 7.8 06/10/2018 0859   ALBUMIN 4.0 06/10/2018 0859   AST 13 (L) 06/10/2018 0859   ALT 17 06/10/2018 0859   ALKPHOS 105 06/10/2018 0859   BILITOT 0.3 06/10/2018 0859   GFRNONAA 54 (L) 06/10/2018 0859   GFRAA >60 06/10/2018 0859    No results found for: TOTALPROTELP, ALBUMINELP, A1GS, A2GS, BETS, BETA2SER, GAMS, MSPIKE, SPEI  No results found for: KPAFRELGTCHN, LAMBDASER, KAPLAMBRATIO  Lab Results  Component Value Date   WBC 6.1 06/10/2018   NEUTROABS 3.1 06/10/2018   HGB 11.8 (L) 06/10/2018   HCT 36.5 06/10/2018   MCV 91.3 06/10/2018   PLT 303 06/10/2018    @LASTCHEMISTRY @  No results found for: LABCA2  No components found for: [id number]  No results for input(s): INR in the last 168 hours.  No results found for: LABCA2  No results found for: MCN470  No results found for: JGG836  No results found for: OQH476  No results found for: CA2729  No components found for: HGQUANT  No results found for: CEA1 / No results found for: CEA1   No results found for: AFPTUMOR  No results found for: CHROMOGRNA  No results found for: PSA1  No visits with results within 3 Day(s) from this visit.  Latest known visit with results is:  Admission on 06/26/2018, Discharged on 06/26/2018  Component Date Value Ref Range Status  . Glucose-Capillary 06/26/2018 150* 70 - 99 mg/dL Final  . Glucose-Capillary 06/26/2018 164* 70 - 99 mg/dL Final    (this displays the last labs from the last 3 days)  No results found for: TOTALPROTELP, ALBUMINELP, A1GS, A2GS, BETS, BETA2SER, GAMS, MSPIKE, SPEI (this displays SPEP labs)  No results found for: KPAFRELGTCHN, LAMBDASER, KAPLAMBRATIO (kappa/lambda light chains)  No results found for: HGBA, HGBA2QUANT, HGBFQUANT, HGBSQUAN (Hemoglobinopathy evaluation)   No results found for: LDH   No results found for: IRON, TIBC, IRONPCTSAT (Iron and TIBC)  No results found for: FERRITIN  Urinalysis No results found for: COLORURINE, APPEARANCEUR, LABSPEC, PHURINE, GLUCOSEU, HGBUR, BILIRUBINUR, KETONESUR, PROTEINUR, UROBILINOGEN, NITRITE, LEUKOCYTESUR   STUDIES: Nm Sentinel Node Inj-no Rpt (breast)  Result Date: 06/26/2018 Sulfur colloid was injected by the nuclear medicine technologist for melanoma sentinel node.   Mm Breast Surgical Specimen  Result Date: 06/26/2018 CLINICAL DATA:  Specimen radiograph status post left breast lumpectomy. EXAM: SPECIMEN RADIOGRAPH OF THE LEFT BREAST COMPARISON:  Previous exam(s). FINDINGS: Status post excision of the left breast. The radioactive seed and biopsy marker clip are present and completely intact. These findings were communicated with the OR at 8:44 a.m. IMPRESSION: Specimen radiograph of the left breast. Electronically Signed   By: Sharyn Lull  Theda Sers M.D.   On: 06/26/2018 08:44   Dg Chest Port 1 View  Result Date: 06/26/2018 CLINICAL DATA:  Post op port a cath placement EXAM: PORTABLE CHEST - 1 VIEW COMPARISON:  none FINDINGS: Right IJ power-injectable port extends to the lower SVC. No pneumothorax. Small caliber metallic lead projects over the right lung apex. Lungs are clear. Low lung volumes. Heart size upper limits normal for technique. No effusion. Visualized bones unremarkable. IMPRESSION: Port catheter to lower SVC without pneumothorax. Electronically Signed   By: Lucrezia Europe M.D.   On: 06/26/2018 10:15   Dg Fluoro Guide Cv Line-no Report  Result Date: 06/26/2018 Fluoroscopy was utilized by the requesting physician.  No radiographic interpretation.   Mm Lt Radioactive Seed Loc Mammo Guide  Result Date: 06/25/2018 CLINICAL DATA:  Pre lumpectomy localization of a recently diagnosed invasive ductal carcinoma in the 10 o'clock position of the left breast. EXAM: MAMMOGRAPHIC GUIDED RADIOACTIVE SEED LOCALIZATION OF THE LEFT BREAST  COMPARISON:  Previous exam(s). FINDINGS: Patient presents for radioactive seed localization prior to left lumpectomy. I met with the patient and we discussed the procedure of seed localization including benefits and alternatives. We discussed the high likelihood of a successful procedure. We discussed the risks of the procedure including infection, bleeding, tissue injury and further surgery. We discussed the low dose of radioactivity involved in the procedure. Informed, written consent was given. The usual time-out protocol was performed immediately prior to the procedure. Using mammographic guidance, sterile technique, 1% lidocaine and an I-125 radioactive seed, the recently placed ribbon shaped biopsy marker clip in the 10 o'clock position of the left breast was localized using a medial approach. The follow-up mammogram images confirm the seed in the expected location and were marked for Dr. Donne Hazel. Follow-up survey of the patient confirms presence of the radioactive seed. Order number of I-125 seed:  [id number]. Total activity:  0.254 mCi reference Date: 06/11/2018 The patient tolerated the procedure well and was released from the [facility]. She was given instructions regarding seed removal. IMPRESSION: Radioactive seed localization left breast. No apparent complications. Electronically Signed   By: Claudie Revering M.D.   On: 06/25/2018 14:09    ELIGIBLE FOR AVAILABLE RESEARCH PROTOCOL: no  ASSESSMENT: 53 y.o. [place] woman status post left breast upper inner quadrant biopsy 06/04/2018 for a clinical T1b N0, stage IB invasive ductal carcinoma, grade 3, estrogen receptor moderately positive, progesterone receptor negative, with an MIB-1 of 80%, and HER-2 nonamplified  (1) left lumpectomy and sentinel lymph node sampling 06/26/2018 showed a pT1c pN0, stage IB invasive ductal carcinoma, grade 3, with negative margins  (a) repeat prognostic panel triple negative  (b) a total of 3 sentinel lymph nodes  were removed   (2) Oncotype score of 56 obtained from the 06/04/2018 biopsy predicts a risk of outside the breast recurrence over the next 9 years of nearly 40% if the patient's only systemic therapy is antiestrogens for 5 years.  It also predicts a significant benefit from chemotherapy.  (a) Oncotype reads the tumor also as triple negative.  (3) adjuvant chemo therapy will consist of cyclophosphamide and docetaxel x4 starting 07/17/2018  (4) adjuvant radiation to follow chemotherapy  (5) consider prophylactic antiestrogens at the end of radiation  PLAN: I went over the final pathology report with Cassandra Sanders.  It is very favorable that she had no positive lymph nodes.  She had negative margins.  She does not like the way the port looks but actually looks just fine right now and  we went over the area where she will be putting on the numbing cream.  The cosmetic result of the left breast is very good.  Given the current pandemic it would be best to have her come here as seldom as possible and therefore I have changed her day 3 Neulasta to OnPro.  We will try to get that approved  Similarly we will see her on day 7 or so after the first cycle but if her counts are good we would only see her on the day of treatment itself for the remaining 3 cycles  She had a tooth accidentally knocked out and she will need some dental extractions.  I suggested she do that on 08/03/2018 which would give her mouth almost a week to heal before her second cycle of chemotherapy  She requested a letter so she could work from home during chemotherapy and I was glad to provide that for her  She met with our chemotherapy teaching nurse and we reviewed any residual questions.  The patient is now ready to start treatment 07/17/2018.  I have urged her to call us with any problems that may develop before her return visit here.    Hinton Luellen, Virgie Dad, MD  07/08/18 5:04 PM Medical Oncology and Hematology [facility] [address] [place], [place] [address] Tel. [phone]    Fax. [phone]   I, Jacqualyn Posey am acting as a Education administrator for Chauncey Cruel, MD.   I, Lurline Del MD, have reviewed the above documentation for accuracy and completeness, and I agree with the above.

## 2018-07-08 ENCOUNTER — Other Ambulatory Visit: Payer: Self-pay

## 2018-07-08 ENCOUNTER — Encounter: Payer: Self-pay | Admitting: Oncology

## 2018-07-08 ENCOUNTER — Inpatient Hospital Stay: Payer: Federal, State, Local not specified - PPO | Attending: Oncology

## 2018-07-08 ENCOUNTER — Inpatient Hospital Stay (HOSPITAL_BASED_OUTPATIENT_CLINIC_OR_DEPARTMENT_OTHER): Payer: Federal, State, Local not specified - PPO | Admitting: Oncology

## 2018-07-08 VITALS — BP 171/90 | HR 97 | Temp 98.8°F | Resp 18 | Ht 69.0 in | Wt 288.4 lb

## 2018-07-08 DIAGNOSIS — I10 Essential (primary) hypertension: Secondary | ICD-10-CM | POA: Diagnosis not present

## 2018-07-08 DIAGNOSIS — Z170 Estrogen receptor positive status [ER+]: Secondary | ICD-10-CM | POA: Diagnosis not present

## 2018-07-08 DIAGNOSIS — Z7982 Long term (current) use of aspirin: Secondary | ICD-10-CM | POA: Diagnosis not present

## 2018-07-08 DIAGNOSIS — Z79899 Other long term (current) drug therapy: Secondary | ICD-10-CM | POA: Insufficient documentation

## 2018-07-08 DIAGNOSIS — C50212 Malignant neoplasm of upper-inner quadrant of left female breast: Secondary | ICD-10-CM | POA: Diagnosis not present

## 2018-07-08 DIAGNOSIS — E119 Type 2 diabetes mellitus without complications: Secondary | ICD-10-CM | POA: Diagnosis not present

## 2018-07-08 DIAGNOSIS — Z171 Estrogen receptor negative status [ER-]: Secondary | ICD-10-CM | POA: Diagnosis not present

## 2018-07-08 DIAGNOSIS — Z5111 Encounter for antineoplastic chemotherapy: Secondary | ICD-10-CM | POA: Diagnosis not present

## 2018-07-08 DIAGNOSIS — Z5189 Encounter for other specified aftercare: Secondary | ICD-10-CM | POA: Diagnosis not present

## 2018-07-08 DIAGNOSIS — Z7984 Long term (current) use of oral hypoglycemic drugs: Secondary | ICD-10-CM | POA: Insufficient documentation

## 2018-07-08 MED ORDER — LIDOCAINE-PRILOCAINE 2.5-2.5 % EX CREA: TOPICAL_CREAM | CUTANEOUS | 3 refills | Status: DC

## 2018-07-08 MED ORDER — DEXAMETHASONE 4 MG PO TABS: 8.0000 mg | ORAL_TABLET | Freq: Two times a day (BID) | ORAL | 1 refills | Status: DC

## 2018-07-08 MED ORDER — PROCHLORPERAZINE MALEATE 10 MG PO TABS: 10.0000 mg | ORAL_TABLET | Freq: Four times a day (QID) | ORAL | 1 refills | Status: DC | PRN

## 2018-07-08 MED ORDER — LORATADINE 10 MG PO TABS: 10.0000 mg | ORAL_TABLET | Freq: Every day | ORAL | 1 refills | Status: DC

## 2018-07-09 ENCOUNTER — Encounter: Payer: Self-pay | Admitting: *Deleted

## 2018-07-09 ENCOUNTER — Other Ambulatory Visit: Payer: Self-pay | Admitting: Oncology

## 2018-07-13 ENCOUNTER — Telehealth: Payer: Self-pay | Admitting: Oncology

## 2018-07-13 ENCOUNTER — Encounter: Payer: Self-pay | Admitting: *Deleted

## 2018-07-13 ENCOUNTER — Other Ambulatory Visit: Payer: Self-pay | Admitting: Oncology

## 2018-07-13 NOTE — Telephone Encounter (Signed)
Scheduled appt per 7/06 sch message - pt aware of time and date.

## 2018-07-13 NOTE — Progress Notes (Signed)
[employer]  [phone] [phone]     ID: Cassandra Sanders DOB: [birth year]  MR#: [id number]  [id number]#:[id number]  Patient Care Team: Lennie Odor, PA-C as PCP - General (Nurse Practitioner) Rockwell Germany, RN as Oncology Nurse Navigator Mauro Kaufmann, RN as Oncology Nurse Navigator Contina Strain, Cassandra Dad, MD as Consulting Physician (Oncology) Rolm Bookbinder, MD as Consulting Physician (General Surgery) Kyung Rudd, MD as Consulting Physician (Radiation Oncology) Marylynn Pearson, MD as Consulting Physician (Obstetrics and Gynecology) Chauncey Cruel, MD OTHER MD:   CHIEF COMPLAINT: Breast cancer, moderately estrogen receptor positive  CURRENT TREATMENT: Adjuvant chemotherapy   HISTORY OF CURRENT ILLNESS: From the original intake note:  Cassandra Sanders had routine screening mammography on 05/15/2018 at [facility] showing a possible abnormality in the left breast. She underwent bilateral diagnostic mammography with tomography and left breast ultrasonography at [facility] on 06/02/2018 showing: breast density category B; suspicious approximate 0.9 cm mass involving the upper inner quadrant of the left breast at posterior depth which account for the screening mammographic finding; no pathologic left axillary lymphadenopathy.  Accordingly on 06/04/2018 she proceeded to biopsy of the left breast area in question. The pathology from this procedure ([id number]) showed: invasive ductal carcinoma, grade 3. Prognostic indicators significant for: estrogen receptor, 60% positive with moderate staining intensity and progesterone receptor, 0% negative. Proliferation marker Ki67 at 80%. HER2 negative by immunohistochemistry (0).  The patient's subsequent history is as detailed below.   INTERVAL HISTORY: Cassandra Sanders returns today for follow-up and treatment of her moderately estrogen receptor positive breast cancer.  I asked her to come in because  the pathology report from the definitive surgery showed the tumor to be a T1c, triple negative, instead of T1b estrogen receptor positive.  We needed to discuss specifics about her chemotherapy based on this difference.  Since her last visit here, she has not undergone any additional studies.     REVIEW OF SYSTEMS: Cassandra Sanders usually walks for 30 minutes per day for exercise. She denies unusual headaches, visual changes, nausea, vomiting, or dizziness. There has been no unusual cough, phlegm production, or pleurisy. This been no change in bowel or bladder habits. The patient denies unexplained fatigue or unexplained weight loss, bleeding, rash, or fever. A detailed review of systems was otherwise noncontributory.    PAST MEDICAL HISTORY: Past Medical History:  Diagnosis Date   Breast cancer ([place]) 06/04/2018   left breast   Diabetes mellitus without complication ([place])    Hypertension     PAST SURGICAL HISTORY: Past Surgical History:  Procedure Laterality Date   BREAST LUMPECTOMY WITH RADIOACTIVE SEED AND SENTINEL LYMPH NODE BIOPSY Left 06/26/2018   Procedure: LEFT BREAST LUMPECTOMY WITH RADIOACTIVE SEED AND LEFT AXILLARY SENTINEL LYMPH NODE BIOPSY;  Surgeon: Rolm Bookbinder, MD;  Location: [facility];  Service: General;  Laterality: Left;   PORTACATH PLACEMENT Right 06/26/2018   Procedure: INSERTION PORT-A-CATH WITH ULTRASOUND;  Surgeon: Rolm Bookbinder, MD;  Location: [place];  Service: General;  Laterality: Right;    FAMILY HISTORY: No family history on file. Patient's father was in his 18s when he was murdered. Patient's mother is currently (as of 06/2018) at age 70. The patient denies a family hx of breast or ovarian cancer. She has 3 brothers and no biological sisters.   GYNECOLOGIC HISTORY:  Patient's last menstrual period was 06/18/2013. Menarche: 53 years old Age at first live birth: 53 years old [place] P 1 LMP June 2015 Contraceptive  used  from age [age] with no complications HRT none  Hysterectomy? no BSO? 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 She is engaged to [facility]. She lives at home with her son [name], age 71, who works here in [place] as a Museum/gallery exhibitions officer.  The patient attends [employer].    ADVANCED DIRECTIVES: not in place; at the 06/10/2018 visit the patient was given the appropriate documents to complete and notarize at her discretion.  She intends to name her son is her healthcare power of attorney   HEALTH MAINTENANCE: Social History   Tobacco Use   Smoking status: Never Smoker   Smokeless tobacco: Never Used  Substance Use Topics   Alcohol use: No   Drug use: No     Colonoscopy: 2016  PAP: 05/15/2018, negative  Bone density: never done   No Known Allergies  Current Outpatient Medications  Medication Sig Dispense Refill   aspirin EC 81 MG tablet Take 81 mg by mouth daily.     dexamethasone (DECADRON) 4 MG tablet Take 2 tablets (8 mg total) by mouth 2 (two) times daily. Start the day before Taxotere. Then again the day after chemo for 3 days. 30 tablet 1   dexamethasone (DECADRON) 4 MG tablet Take 2 tablets by mouth once a day on the day after chemotherapy and then take 2 tablets two times a day for 2 days. Take with food. 30 tablet 1   JANUVIA 100 MG tablet daily.      lidocaine-prilocaine (EMLA) cream Apply to affected area once 30 g 3   lidocaine-prilocaine (EMLA) cream Apply to affected area once 30 g 3   lisinopril (PRINIVIL,ZESTRIL) 10 MG tablet Take 10 mg by mouth daily.  2   loratadine (CLARITIN) 10 MG tablet Take 1 tablet (10 mg total) by mouth daily. 90 tablet 1   LORazepam (ATIVAN) 0.5 MG tablet Take 1 tablet (0.5 mg total) by mouth at bedtime as needed and may repeat dose one time if needed (Nausea or vomiting). 30 tablet 0   METFORMIN HCL PO Take 1,000 mg by  mouth daily.      oxyCODONE (OXY IR/ROXICODONE) 5 MG immediate release tablet Take 1 tablet (5 mg total) by mouth every 6 (six) hours as needed for moderate pain, severe pain or breakthrough pain. 12 tablet 0   prochlorperazine (COMPAZINE) 10 MG tablet Take 1 tablet (10 mg total) by mouth every 6 (six) hours as needed (Nausea or vomiting). 30 tablet 1   prochlorperazine (COMPAZINE) 10 MG tablet Take 1 tablet (10 mg total) by mouth every 6 (six) hours as needed (Nausea or vomiting). 30 tablet 1   No current facility-administered medications for this visit.     OBJECTIVE: Morbidly obese African-American woman in no acute distress  Vitals:   07/14/18 1459  BP: (!) 155/88  Pulse: 93  Resp: 17  Temp: 98.8 F (37.1 C)  SpO2: 100%   Body mass index is 42.57 kg/m.  Wt Readings from Last 3 Encounters:  07/14/18 288 lb 4.8 oz (130.8 kg)  07/08/18 288 lb 6.4 oz (130.8 kg)  06/26/18 289 lb 14.5 oz (131.5 kg)   ECOG FS:1 - Symptomatic but completely ambulatory  Sclerae unicteric, EOMs intact Wearing a mask No cervical or supraclavicular adenopathy Lungs no rales or rhonchi Heart regular rate and rhythm Abd soft, nontender, positive bowel sounds MSK no focal spinal tenderness, no upper extremity  lymphedema Neuro: nonfocal, well oriented, appropriate affect Breasts: Deferred   LAB RESULTS:  CMP     Component Value Date/Time   NA 139 06/10/2018 0859   K 3.8 06/10/2018 0859   CL 104 06/10/2018 0859   CO2 25 06/10/2018 0859   GLUCOSE 190 (H) 06/10/2018 0859   BUN 16 06/10/2018 0859   CREATININE 1.14 (H) 06/10/2018 0859   CALCIUM 9.1 06/10/2018 0859   PROT 7.8 06/10/2018 0859   ALBUMIN 4.0 06/10/2018 0859   AST 13 (L) 06/10/2018 0859   ALT 17 06/10/2018 0859   ALKPHOS 105 06/10/2018 0859   BILITOT 0.3 06/10/2018 0859   GFRNONAA 54 (L) 06/10/2018 0859   GFRAA >60 06/10/2018 0859    No results found for: TOTALPROTELP, ALBUMINELP, A1GS, A2GS, BETS, BETA2SER, GAMS, MSPIKE,  SPEI  No results found for: KPAFRELGTCHN, LAMBDASER, KAPLAMBRATIO  Lab Results  Component Value Date   WBC 6.1 06/10/2018   NEUTROABS 3.1 06/10/2018   HGB 11.8 (L) 06/10/2018   HCT 36.5 06/10/2018   MCV 91.3 06/10/2018   PLT 303 06/10/2018    @LASTCHEMISTRY @  No results found for: LABCA2  No components found for: HYIFOY774  No results for input(s): INR in the last 168 hours.  No results found for: LABCA2  No results found for: JOI786  No results found for: VEH209  No results found for: OBS962  No results found for: CA2729  No components found for: HGQUANT  No results found for: CEA1 / No results found for: CEA1   No results found for: AFPTUMOR  No results found for: CHROMOGRNA  No results found for: PSA1  No visits with results within 3 Day(s) from this visit.  Latest known visit with results is:  Admission on 06/26/2018, Discharged on 06/26/2018  Component Date Value Ref Range Status   Glucose-Capillary 06/26/2018 150* 70 - 99 mg/dL Final   Glucose-Capillary 06/26/2018 164* 70 - 99 mg/dL Final    (this displays the last labs from the last 3 days)  No results found for: TOTALPROTELP, ALBUMINELP, A1GS, A2GS, BETS, BETA2SER, GAMS, MSPIKE, SPEI (this displays SPEP labs)  No results found for: KPAFRELGTCHN, LAMBDASER, KAPLAMBRATIO (kappa/lambda light chains)  No results found for: HGBA, HGBA2QUANT, HGBFQUANT, HGBSQUAN (Hemoglobinopathy evaluation)   No results found for: LDH  No results found for: IRON, TIBC, IRONPCTSAT (Iron and TIBC)  No results found for: FERRITIN  Urinalysis No results found for: COLORURINE, APPEARANCEUR, LABSPEC, PHURINE, GLUCOSEU, HGBUR, BILIRUBINUR, KETONESUR, PROTEINUR, UROBILINOGEN, NITRITE, LEUKOCYTESUR   STUDIES: Nm Sentinel Node Inj-no Rpt (breast)  Result Date: 06/26/2018 Sulfur colloid was injected by the nuclear medicine technologist for melanoma sentinel node.   Mm Breast Surgical Specimen  Result Date:  06/26/2018 CLINICAL DATA:  Specimen radiograph status post left breast lumpectomy. EXAM: SPECIMEN RADIOGRAPH OF THE LEFT BREAST COMPARISON:  Previous exam(s). FINDINGS: Status post excision of the left breast. The radioactive seed and biopsy marker clip are present and completely intact. These findings were communicated with the OR at 8:44 a.m. IMPRESSION: Specimen radiograph of the left breast. Electronically Signed   By: Ammie Ferrier M.D.   On: 06/26/2018 08:44   Dg Chest Port 1 View  Result Date: 06/26/2018 CLINICAL DATA:  Post op port a cath placement EXAM: PORTABLE CHEST - 1 VIEW COMPARISON:  none FINDINGS: Right IJ power-injectable port extends to the lower SVC. No pneumothorax. Small caliber metallic lead projects over the right lung apex. Lungs are clear. Low lung volumes. Heart size upper limits normal for technique. No  effusion. Visualized bones unremarkable. IMPRESSION: Port catheter to lower SVC without pneumothorax. Electronically Signed   By: Lucrezia Europe M.D.   On: 06/26/2018 10:15   Dg Fluoro Guide Cv Line-no Report  Result Date: 06/26/2018 Fluoroscopy was utilized by the requesting physician.  No radiographic interpretation.   Mm Lt Radioactive Seed Loc Mammo Guide  Result Date: 06/25/2018 CLINICAL DATA:  Pre lumpectomy localization of a recently diagnosed invasive ductal carcinoma in the 10 o'clock position of the left breast. EXAM: MAMMOGRAPHIC GUIDED RADIOACTIVE SEED LOCALIZATION OF THE LEFT BREAST COMPARISON:  Previous exam(s). FINDINGS: Patient presents for radioactive seed localization prior to left lumpectomy. I met with the patient and we discussed the procedure of seed localization including benefits and alternatives. We discussed the high likelihood of a successful procedure. We discussed the risks of the procedure including infection, bleeding, tissue injury and further surgery. We discussed the low dose of radioactivity involved in the procedure. Informed, written consent  was given. The usual time-out protocol was performed immediately prior to the procedure. Using mammographic guidance, sterile technique, 1% lidocaine and an I-125 radioactive seed, the recently placed ribbon shaped biopsy marker clip in the 10 o'clock position of the left breast was localized using a medial approach. The follow-up mammogram images confirm the seed in the expected location and were marked for Dr. Donne Hazel. Follow-up survey of the patient confirms presence of the radioactive seed. Order number of I-125 seed:  [id number]. Total activity:  0.254 mCi reference Date: 06/11/2018 The patient tolerated the procedure well and was released from the [facility]. She was given instructions regarding seed removal. IMPRESSION: Radioactive seed localization left breast. No apparent complications. Electronically Signed   By: Claudie Revering M.D.   On: 06/25/2018 14:09    ELIGIBLE FOR AVAILABLE RESEARCH PROTOCOL: no  ASSESSMENT: 53 y.o. [place] woman status post left breast upper inner quadrant biopsy 06/04/2018 for a clinical T1b N0, stage IB invasive ductal carcinoma, grade 3, estrogen receptor moderately positive, progesterone receptor negative, with an MIB-1 of 80%, and HER-2 nonamplified  (1) left lumpectomy and sentinel lymph node sampling 06/26/2018 showed a pT1c pN0, stage IB invasive ductal carcinoma, grade 3, with negative margins  (a) repeat prognostic panel triple negative  (b) a total of 3 sentinel lymph nodes were removed  (2) Oncotype score of 56 obtained from the 06/04/2018 biopsy predicts a risk of outside the breast recurrence over the next 9 years of nearly 40% if the patient's only systemic therapy is antiestrogens for 5 years.  It also predicts a significant benefit from chemotherapy.  (a) Oncotype reads the tumor also as triple negative.  (3) adjuvant chemo therapy will consist of cyclophosphamide and Adriamycin in dose dense fashion x4 starting 07/17/2018, followed by weekly  paclitaxel and carboplatin x12  (4) adjuvant radiation to follow chemotherapy   PLAN: I reviewed the pathology report with Emmalin.  We had originally planned on 4 cycles of TC based on a T1b estrogen receptor positive tumor.  It did not really strike me at our last visit that we are now dealing with a clearly triple negative T1c tumor and that 4 cycles of TC is not the gold standard in the setting.  Certainly we could do 6 cycles of TC but I think that would be extremely difficult to get through.  The standard of care is better, namely cyclophosphamide and doxorubicin in dose dense fashion x4.  This would be followed by weekly paclitaxel with carboplatin weekly x12.  Given the size of  her tumor the triple negativity and the extremely high Oncotype score I think this is a better plan.  I discussed the possible toxicities side effects and complications in detail with Lateefa.  She is agreeable to the change.  We have set her up for an echocardiogram on 07/16/2018.  Assuming that is adequate, she will have her first cycle of treatment 07/17/2018.  We will see her a week later to assess nadir counts and troubleshoot problems.  She will let me know if we need to write a letter regarding her employment or other concerns  She is hoping she will be able to complete her radiation treatments before the end of the year.  She knows to call for any other issue that may develop before the next visit.   Hoke Baer, Cassandra Dad, MD  07/14/18 5:33 PM Medical Oncology and Hematology [facility] [address] [place], [place] [address] Tel. [phone]    Fax. [phone]  I, Jacqualyn Posey am acting as a Education administrator for Chauncey Cruel, MD.   I, Lurline Del MD, have reviewed the above documentation for accuracy and completeness, and I agree with the above.

## 2018-07-14 ENCOUNTER — Other Ambulatory Visit: Payer: Self-pay

## 2018-07-14 ENCOUNTER — Other Ambulatory Visit: Payer: Self-pay | Admitting: Oncology

## 2018-07-14 ENCOUNTER — Inpatient Hospital Stay (HOSPITAL_BASED_OUTPATIENT_CLINIC_OR_DEPARTMENT_OTHER): Payer: Federal, State, Local not specified - PPO | Admitting: Oncology

## 2018-07-14 VITALS — BP 155/88 | HR 93 | Temp 98.8°F | Resp 17 | Ht 69.0 in | Wt 288.3 lb

## 2018-07-14 DIAGNOSIS — Z79899 Other long term (current) drug therapy: Secondary | ICD-10-CM | POA: Diagnosis not present

## 2018-07-14 DIAGNOSIS — Z7982 Long term (current) use of aspirin: Secondary | ICD-10-CM | POA: Diagnosis not present

## 2018-07-14 DIAGNOSIS — C50212 Malignant neoplasm of upper-inner quadrant of left female breast: Secondary | ICD-10-CM

## 2018-07-14 DIAGNOSIS — Z5111 Encounter for antineoplastic chemotherapy: Secondary | ICD-10-CM | POA: Diagnosis not present

## 2018-07-14 DIAGNOSIS — E119 Type 2 diabetes mellitus without complications: Secondary | ICD-10-CM

## 2018-07-14 DIAGNOSIS — Z7984 Long term (current) use of oral hypoglycemic drugs: Secondary | ICD-10-CM

## 2018-07-14 DIAGNOSIS — Z170 Estrogen receptor positive status [ER+]: Secondary | ICD-10-CM

## 2018-07-14 DIAGNOSIS — Z5189 Encounter for other specified aftercare: Secondary | ICD-10-CM | POA: Diagnosis not present

## 2018-07-14 DIAGNOSIS — I10 Essential (primary) hypertension: Secondary | ICD-10-CM | POA: Diagnosis not present

## 2018-07-14 MED ORDER — DEXAMETHASONE 4 MG PO TABS: ORAL_TABLET | ORAL | 1 refills | Status: DC

## 2018-07-14 MED ORDER — PROCHLORPERAZINE MALEATE 10 MG PO TABS: 10.0000 mg | ORAL_TABLET | Freq: Four times a day (QID) | ORAL | 1 refills | Status: DC | PRN

## 2018-07-14 MED ORDER — LIDOCAINE-PRILOCAINE 2.5-2.5 % EX CREA: TOPICAL_CREAM | CUTANEOUS | 3 refills | Status: DC

## 2018-07-14 MED ORDER — LORAZEPAM 0.5 MG PO TABS: 0.5000 mg | ORAL_TABLET | Freq: Every evening | ORAL | 0 refills | Status: DC | PRN

## 2018-07-14 NOTE — Progress Notes (Signed)
DISCONTINUE ON PATHWAY REGIMEN - Breast     A cycle is every 21 days:     Docetaxel      Cyclophosphamide   **Always confirm dose/schedule in your pharmacy ordering system**  REASON: Other Reason PRIOR TREATMENT: [id number]: TC - Docetaxel, Cyclophosphamide q21 Days x 4 Cycles TREATMENT RESPONSE: Unable to Evaluate  START OFF PATHWAY REGIMEN - Breast   [id number]:AC q21 Days C1-4 followed by Carboplatin AUC=6 D1 + Paclitaxel 80 mg/m2 D1,8,15 q21 Days C5-8:   A cycle is every 21 days:     Doxorubicin      Cyclophosphamide      Paclitaxel      Carboplatin   **Always confirm dose/schedule in your pharmacy ordering system**  Patient Characteristics: Postoperative without Neoadjuvant Therapy (Pathologic Staging), Invasive Disease, Adjuvant Therapy, HER2 Negative/Unknown/Equivocal, ER Positive, Node Negative, pT1a-c, pN0/N60m or pT2 or Higher, pN0, Oncotype High Risk (? 26) Therapeutic Status: Postoperative without Neoadjuvant Therapy (Pathologic Staging) AJCC Grade: G2 AJCC N Category: pN0 AJCC M Category: cM0 ER Status: Positive (+) AJCC 8 Stage Grouping: IA HER2 Status: Negative (-) Oncotype Dx Recurrence Score: 54AJCC T Category: pT1b PR Status: Positive (+) Has this patient completed genomic testing<= Yes - Oncotype DX(R) Intent of Therapy: Curative Intent, Discussed with Patient

## 2018-07-15 ENCOUNTER — Ambulatory Visit: Payer: Federal, State, Local not specified - PPO | Admitting: Oncology

## 2018-07-16 ENCOUNTER — Other Ambulatory Visit: Payer: Self-pay | Admitting: Oncology

## 2018-07-16 ENCOUNTER — Ambulatory Visit (HOSPITAL_COMMUNITY)
Admission: RE | Admit: 2018-07-16 | Discharge: 2018-07-16 | Disposition: A | Discharge status: Home / Self Care | Payer: Federal, State, Local not specified - PPO | Source: Ambulatory Visit | Attending: Oncology | Admitting: Oncology

## 2018-07-16 ENCOUNTER — Other Ambulatory Visit: Payer: Self-pay

## 2018-07-16 ENCOUNTER — Encounter: Payer: Self-pay | Admitting: Oncology

## 2018-07-16 DIAGNOSIS — Z170 Estrogen receptor positive status [ER+]: Secondary | ICD-10-CM | POA: Insufficient documentation

## 2018-07-16 DIAGNOSIS — C50212 Malignant neoplasm of upper-inner quadrant of left female breast: Secondary | ICD-10-CM | POA: Diagnosis not present

## 2018-07-16 NOTE — Progress Notes (Signed)
Called patient to introduce myself as Arboriculturist and to offer available resources.  Discussed one-time $1000 Radio broadcast assistant to assist with personal expenses while going through treatment. Based on income guidelines, patient states her household is over the income. Advised her should anything change, to contact me for further review if interested. She verbalized understanding.  Discussed copay assistance available for injection. Advised how the copay assistance works and I would enroll her once confirmed which injection she would be receiving after treatment. She verbalized understanding and has my contact information for any additional financial questions or concerns.    Discussed with Darlena the injection shown in treatment plan versus changes to injections. She called Dr.Magrinat to confirm what patient would be receiving. He states Neulasta Onprokit.  Enrolled patient in [place] program for Neulasta. Patient approved for the $10,000 for the calendar year. The first injection would be covered at 100% and each additional injection would leave her with a $5 copay after insurance pays. Called [employer]([place]) to obtain card information for submitting claims. Information given to [facility] for billing/copay submissions.  Patient will be provided with my card on 07/17/18. Will follow up to confirm enrollment in [place] for Neulasta.

## 2018-07-16 NOTE — Progress Notes (Signed)
  Echocardiogram 2D Echocardiogram has been performed.  Cassandra Sanders L Androw 07/16/2018, 11:55 AM

## 2018-07-17 ENCOUNTER — Other Ambulatory Visit: Payer: Self-pay

## 2018-07-17 ENCOUNTER — Inpatient Hospital Stay: Payer: Federal, State, Local not specified - PPO

## 2018-07-17 ENCOUNTER — Inpatient Hospital Stay (HOSPITAL_BASED_OUTPATIENT_CLINIC_OR_DEPARTMENT_OTHER): Payer: Federal, State, Local not specified - PPO | Admitting: Adult Health

## 2018-07-17 ENCOUNTER — Encounter: Payer: Self-pay | Admitting: Adult Health

## 2018-07-17 ENCOUNTER — Ambulatory Visit: Payer: Federal, State, Local not specified - PPO

## 2018-07-17 VITALS — BP 134/93 | HR 95 | Temp 97.8°F | Resp 18 | Ht 69.0 in | Wt 288.3 lb

## 2018-07-17 VITALS — BP 144/88

## 2018-07-17 DIAGNOSIS — C50212 Malignant neoplasm of upper-inner quadrant of left female breast: Secondary | ICD-10-CM

## 2018-07-17 DIAGNOSIS — Z79899 Other long term (current) drug therapy: Secondary | ICD-10-CM

## 2018-07-17 DIAGNOSIS — Z170 Estrogen receptor positive status [ER+]: Secondary | ICD-10-CM | POA: Diagnosis not present

## 2018-07-17 DIAGNOSIS — I10 Essential (primary) hypertension: Secondary | ICD-10-CM | POA: Diagnosis not present

## 2018-07-17 DIAGNOSIS — Z6841 Body Mass Index (BMI) 40.0 and over, adult: Secondary | ICD-10-CM

## 2018-07-17 DIAGNOSIS — E119 Type 2 diabetes mellitus without complications: Secondary | ICD-10-CM | POA: Diagnosis not present

## 2018-07-17 DIAGNOSIS — Z7984 Long term (current) use of oral hypoglycemic drugs: Secondary | ICD-10-CM

## 2018-07-17 DIAGNOSIS — Z5111 Encounter for antineoplastic chemotherapy: Secondary | ICD-10-CM | POA: Diagnosis not present

## 2018-07-17 DIAGNOSIS — Z7982 Long term (current) use of aspirin: Secondary | ICD-10-CM | POA: Diagnosis not present

## 2018-07-17 DIAGNOSIS — Z5189 Encounter for other specified aftercare: Secondary | ICD-10-CM | POA: Diagnosis not present

## 2018-07-17 DIAGNOSIS — Z95828 Presence of other vascular implants and grafts: Secondary | ICD-10-CM | POA: Insufficient documentation

## 2018-07-17 LAB — CBC WITH DIFFERENTIAL/PLATELET
Abs Immature Granulocytes: 0.02 10*3/uL (ref 0.00–0.07)
Basophils Absolute: 0 10*3/uL (ref 0.0–0.1)
Basophils Relative: 0 %
Eosinophils Absolute: 0.2 10*3/uL (ref 0.0–0.5)
Eosinophils Relative: 2 %
HCT: 34.5 % — ABNORMAL LOW (ref 36.0–46.0)
Hemoglobin: 11.3 g/dL — ABNORMAL LOW (ref 12.0–15.0)
Immature Granulocytes: 0 %
Lymphocytes Relative: 27 %
Lymphs Abs: 2.2 10*3/uL (ref 0.7–4.0)
MCH: 29.8 pg (ref 26.0–34.0)
MCHC: 32.8 g/dL (ref 30.0–36.0)
MCV: 91 fL (ref 80.0–100.0)
Monocytes Absolute: 0.6 10*3/uL (ref 0.1–1.0)
Monocytes Relative: 8 %
Neutro Abs: 5.1 10*3/uL (ref 1.7–7.7)
Neutrophils Relative %: 63 %
Platelets: 321 10*3/uL (ref 150–400)
RBC: 3.79 MIL/uL — ABNORMAL LOW (ref 3.87–5.11)
RDW: 12.6 % (ref 11.5–15.5)
WBC: 8.1 10*3/uL (ref 4.0–10.5)
nRBC: 0 % (ref 0.0–0.2)

## 2018-07-17 LAB — COMPREHENSIVE METABOLIC PANEL
ALT: 16 U/L (ref 0–44)
AST: 13 U/L — ABNORMAL LOW (ref 15–41)
Albumin: 3.4 g/dL — ABNORMAL LOW (ref 3.5–5.0)
Alkaline Phosphatase: 114 U/L (ref 38–126)
Anion gap: 11 (ref 5–15)
BUN: 11 mg/dL (ref 6–20)
CO2: 24 mmol/L (ref 22–32)
Calcium: 8.9 mg/dL (ref 8.9–10.3)
Chloride: 105 mmol/L (ref 98–111)
Creatinine, Ser: 1.06 mg/dL — ABNORMAL HIGH (ref 0.44–1.00)
GFR calc Af Amer: >60 mL/min (ref >60)
GFR calc non Af Amer: 59 mL/min — ABNORMAL LOW (ref >60)
Glucose, Bld: 280 mg/dL — ABNORMAL HIGH (ref 70–99)
Potassium: 3.8 mmol/L (ref 3.5–5.1)
Sodium: 140 mmol/L (ref 135–145)
Total Bilirubin: 0.3 mg/dL (ref 0.3–1.2)
Total Protein: 7.7 g/dL (ref 6.5–8.1)

## 2018-07-17 MED ORDER — PEGFILGRASTIM 6 MG/0.6ML ~~LOC~~ PSKT
PREFILLED_SYRINGE | SUBCUTANEOUS | Status: AC
  Filled 2018-07-17: qty 0.6

## 2018-07-17 MED ORDER — SODIUM CHLORIDE 0.9 % IV SOLN
600.0000 mg/m2 | Freq: Once | INTRAVENOUS | Status: AC
  Administered 2018-07-17: 1520 mg via INTRAVENOUS
  Filled 2018-07-17: qty 76

## 2018-07-17 MED ORDER — SODIUM CHLORIDE 0.9% FLUSH
10.0000 mL | Freq: Once | INTRAVENOUS | Status: AC
  Administered 2018-07-17: 10 mL
  Filled 2018-07-17: qty 10

## 2018-07-17 MED ORDER — SODIUM CHLORIDE 0.9% FLUSH
10.0000 mL | INTRAVENOUS | Status: DC | PRN
  Administered 2018-07-17: 10 mL
  Filled 2018-07-17: qty 10

## 2018-07-17 MED ORDER — SODIUM CHLORIDE 0.9 % IV SOLN
Freq: Once | INTRAVENOUS | Status: AC
  Administered 2018-07-17: 10:00:00 via INTRAVENOUS
  Filled 2018-07-17: qty 5

## 2018-07-17 MED ORDER — PALONOSETRON HCL INJECTION 0.25 MG/5ML
INTRAVENOUS | Status: AC
  Filled 2018-07-17: qty 5

## 2018-07-17 MED ORDER — PALONOSETRON HCL INJECTION 0.25 MG/5ML
0.2500 mg | Freq: Once | INTRAVENOUS | Status: AC
  Administered 2018-07-17: 0.25 mg via INTRAVENOUS

## 2018-07-17 MED ORDER — HEPARIN SOD (PORK) LOCK FLUSH 100 UNIT/ML IV SOLN
500.0000 [IU] | Freq: Once | INTRAVENOUS | Status: AC | PRN
  Administered 2018-07-17: 500 [IU]
  Filled 2018-07-17: qty 5

## 2018-07-17 MED ORDER — PEGFILGRASTIM 6 MG/0.6ML ~~LOC~~ PSKT
6.0000 mg | PREFILLED_SYRINGE | Freq: Once | SUBCUTANEOUS | Status: AC
  Administered 2018-07-17: 6 mg via SUBCUTANEOUS

## 2018-07-17 MED ORDER — SODIUM CHLORIDE 0.9 % IV SOLN
Freq: Once | INTRAVENOUS | Status: AC
  Administered 2018-07-17: 10:00:00 via INTRAVENOUS
  Filled 2018-07-17: qty 250

## 2018-07-17 MED ORDER — DOXORUBICIN HCL CHEMO IV INJECTION 2 MG/ML
60.0000 mg/m2 | Freq: Once | INTRAVENOUS | Status: AC
  Administered 2018-07-17: 152 mg via INTRAVENOUS
  Filled 2018-07-17: qty 76

## 2018-07-17 NOTE — Patient Instructions (Signed)
[place] [facility] Discharge Instructions for Patients Receiving Chemotherapy  Today you received the following chemotherapy agents:  Doxorubicin (Adriamycin), Cyclophosphamide (Cytoxan)  To help prevent nausea and vomiting after your treatment, we encourage you to take your nausea medication as prescribed.   If you develop nausea and vomiting that is not controlled by your nausea medication, call the clinic.   BELOW ARE SYMPTOMS THAT SHOULD BE REPORTED IMMEDIATELY:  *FEVER GREATER THAN 100.5 F  *CHILLS WITH OR WITHOUT FEVER  NAUSEA AND VOMITING THAT IS NOT CONTROLLED WITH YOUR NAUSEA MEDICATION  *UNUSUAL SHORTNESS OF BREATH  *UNUSUAL BRUISING OR BLEEDING  TENDERNESS IN MOUTH AND THROAT WITH OR WITHOUT PRESENCE OF ULCERS  *URINARY PROBLEMS  *BOWEL PROBLEMS  UNUSUAL RASH Items with * indicate a potential emergency and should be followed up as soon as possible.  Feel free to call the clinic should you have any questions or concerns. The clinic phone number is [phone].  Please show the [place] at check-in to the Emergency Department and triage nurse.

## 2018-07-17 NOTE — Progress Notes (Signed)
[place]  [phone] [phone]     ID: Cassandra Sanders DOB: [birth year]  MR#: [id number]  NOM#:[id number]  Patient Care Team: Lennie Odor, PA-C as PCP - General (Nurse Practitioner) Rockwell Germany, RN as Oncology Nurse Navigator Mauro Kaufmann, RN as Oncology Nurse Navigator Magrinat, Virgie Dad, MD as Consulting Physician (Oncology) Rolm Bookbinder, MD as Consulting Physician (General Surgery) Kyung Rudd, MD as Consulting Physician (Radiation Oncology) Marylynn Pearson, MD as Consulting Physician (Obstetrics and Gynecology) Scot Dock, NP OTHER MD:   CHIEF COMPLAINT: Breast cancer, moderately estrogen receptor positive  CURRENT TREATMENT: Adjuvant chemotherapy   HISTORY OF CURRENT ILLNESS: From the original intake note:  Cassandra Sanders had routine screening mammography on 05/15/2018 at [facility] showing a possible abnormality in the left breast. She underwent bilateral diagnostic mammography with tomography and left breast ultrasonography at [facility] on 06/02/2018 showing: breast density category B; suspicious approximate 0.9 cm mass involving the upper inner quadrant of the left breast at posterior depth which account for the screening mammographic finding; no pathologic left axillary lymphadenopathy.  Accordingly on 06/04/2018 she proceeded to biopsy of the left breast area in question. The pathology from this procedure ([id number]) showed: invasive ductal carcinoma, grade 3. Prognostic indicators significant for: estrogen receptor, 60% positive with moderate staining intensity and progesterone receptor, 0% negative. Proliferation marker Ki67 at 80%. HER2 negative by immunohistochemistry (0).  The patient's subsequent history is as detailed below.   INTERVAL HISTORY: Cassandra Sanders returns today for follow-up and treatment of her triple negative breast cancer.  She is due to start adjuvant chemotherapy today with  dose dense doxorubicin and cyclophosphamide with onpro support given on day 1 of a 14 day cycle.  She will receive four cycles.  She is cycle 1 day 1 today.    Since her last visit she underwent echocardiogram on 07/16/2018 that showed an EF of 60-65%.    REVIEW OF SYSTEMS: Cassandra Sanders is doing well today.  She is slightly nervous about her first chemotherapy treatment.  She notes her port placement went well and is delighted with her echo results.  She has picked up all of her anti nausea medications.  Cassandra Sanders is feeling well.  She is walking daily.  She is keeping appropriate pandemic precautions.    Cassandra Sanders denies any fever, chills, cough, shortness of breath, chest pain, palpitations.  Cassandra Sanders has no nausea vomiting, bowel/bladder changes, or any other concerns.  She does have h/o diabetes and her blood sugar is 280 today.  A detailed ROS was otherwise non contributory.     PAST MEDICAL HISTORY: Past Medical History:  Diagnosis Date   Breast cancer ([place]) 06/04/2018   left breast   Diabetes mellitus without complication ([place])    Hypertension     PAST SURGICAL HISTORY: Past Surgical History:  Procedure Laterality Date   BREAST LUMPECTOMY WITH RADIOACTIVE SEED AND SENTINEL LYMPH NODE BIOPSY Left 06/26/2018   Procedure: LEFT BREAST LUMPECTOMY WITH RADIOACTIVE SEED AND LEFT AXILLARY SENTINEL LYMPH NODE BIOPSY;  Surgeon: Rolm Bookbinder, MD;  Location: [facility];  Service: General;  Laterality: Left;   PORTACATH PLACEMENT Right 06/26/2018   Procedure: INSERTION PORT-A-CATH WITH ULTRASOUND;  Surgeon: Rolm Bookbinder, MD;  Location: [facility];  Service: General;  Laterality: Right;    FAMILY HISTORY: No family history on file. Patient's father was in his 65s when he was murdered. Patient's mother is currently (as of 06/2018) at age 98. The patient  denies a family hx of breast or ovarian cancer. She has 3 brothers and no biological sisters.   GYNECOLOGIC HISTORY:   Patient's last menstrual period was 06/18/2013. Menarche: 53 years old Age at first live birth: 53 years old [place] P 1 LMP June 2015 Contraceptive used from age [age] with no complications HRT none  Hysterectomy? no BSO? 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 She is engaged to [facility]. She lives at home with her son [name], age 39, who works here in [place] as a Museum/gallery exhibitions officer.  The patient attends [employer].    ADVANCED DIRECTIVES: not in place; at the 06/10/2018 visit the patient was given the appropriate documents to complete and notarize at her discretion.  She intends to name her son is her healthcare power of attorney   HEALTH MAINTENANCE: Social History   Tobacco Use   Smoking status: Never Smoker   Smokeless tobacco: Never Used  Substance Use Topics   Alcohol use: No   Drug use: No     Colonoscopy: 2016  PAP: 05/15/2018, negative  Bone density: never done   No Known Allergies  Current Outpatient Medications  Medication Sig Dispense Refill   aspirin EC 81 MG tablet Take 81 mg by mouth daily.     dexamethasone (DECADRON) 4 MG tablet Take 2 tablets (8 mg total) by mouth 2 (two) times daily. Start the day before Taxotere. Then again the day after chemo for 3 days. 30 tablet 1   dexamethasone (DECADRON) 4 MG tablet Take 2 tablets by mouth once a day on the day after chemotherapy and then take 2 tablets two times a day for 2 days. Take with food. 30 tablet 1   JANUVIA 100 MG tablet daily.      lidocaine-prilocaine (EMLA) cream Apply to affected area once 30 g 3   lidocaine-prilocaine (EMLA) cream Apply to affected area once 30 g 3   lisinopril (PRINIVIL,ZESTRIL) 10 MG tablet Take 10 mg by mouth daily.  2   loratadine (CLARITIN) 10 MG tablet Take 1 tablet (10 mg total) by mouth daily. 90 tablet 1   LORazepam (ATIVAN) 0.5 MG tablet Take 1 tablet  (0.5 mg total) by mouth at bedtime as needed and may repeat dose one time if needed (Nausea or vomiting). 30 tablet 0   METFORMIN HCL PO Take 1,000 mg by mouth daily.      oxyCODONE (OXY IR/ROXICODONE) 5 MG immediate release tablet Take 1 tablet (5 mg total) by mouth every 6 (six) hours as needed for moderate pain, severe pain or breakthrough pain. 12 tablet 0   prochlorperazine (COMPAZINE) 10 MG tablet Take 1 tablet (10 mg total) by mouth every 6 (six) hours as needed (Nausea or vomiting). 30 tablet 1   prochlorperazine (COMPAZINE) 10 MG tablet Take 1 tablet (10 mg total) by mouth every 6 (six) hours as needed (Nausea or vomiting). 30 tablet 1   No current facility-administered medications for this visit.     OBJECTIVE:   Vitals:   07/17/18 0816  BP: (!) 134/93  Pulse: 95  Resp: 18  Temp: 97.8 F (36.6 C)  SpO2: 99%   Body mass index is 42.57 kg/m.  Wt Readings from Last 3 Encounters:  07/17/18 288 lb 4.8 oz (130.8 kg)  07/14/18 288 lb 4.8 oz (130.8 kg)  07/08/18 288 lb 6.4 oz (130.8 kg)  ECOG FS:1 - Symptomatic but  completely ambulatory GENERAL: Patient is a well appearing female in no acute distress HEENT:  Sclerae anicteric.  Oropharynx clear and moist. No ulcerations or evidence of oropharyngeal candidiasis. Neck is supple.  NODES:  No cervical, supraclavicular, or axillary lymphadenopathy palpated.  BREAST EXAM:  Deferred. LUNGS:  Clear to auscultation bilaterally.  No wheezes or rhonchi. HEART:  Regular rate and rhythm. No murmur appreciated. ABDOMEN:  Soft, nontender.  Positive, normoactive bowel sounds. No organomegaly palpated. MSK:  No focal spinal tenderness to palpation. Full range of motion bilaterally in the upper extremities. EXTREMITIES:  No peripheral edema.   SKIN:  Clear with no obvious rashes or skin changes. No nail dyscrasia. NEURO:  Nonfocal. Well oriented.  Appropriate affect.   LAB RESULTS:  CMP     Component Value Date/Time   NA 140  07/17/2018 0801   K 3.8 07/17/2018 0801   CL 105 07/17/2018 0801   CO2 24 07/17/2018 0801   GLUCOSE 280 (H) 07/17/2018 0801   BUN 11 07/17/2018 0801   CREATININE 1.06 (H) 07/17/2018 0801   CREATININE 1.14 (H) 06/10/2018 0859   CALCIUM 8.9 07/17/2018 0801   PROT 7.7 07/17/2018 0801   ALBUMIN 3.4 (L) 07/17/2018 0801   AST 13 (L) 07/17/2018 0801   AST 13 (L) 06/10/2018 0859   ALT 16 07/17/2018 0801   ALT 17 06/10/2018 0859   ALKPHOS 114 07/17/2018 0801   BILITOT 0.3 07/17/2018 0801   BILITOT 0.3 06/10/2018 0859   GFRNONAA 59 (L) 07/17/2018 0801   GFRNONAA 54 (L) 06/10/2018 0859   GFRAA >60 07/17/2018 0801   GFRAA >60 06/10/2018 0859    No results found for: TOTALPROTELP, ALBUMINELP, A1GS, A2GS, BETS, BETA2SER, GAMS, MSPIKE, SPEI  No results found for: KPAFRELGTCHN, LAMBDASER, KAPLAMBRATIO  Lab Results  Component Value Date   WBC 8.1 07/17/2018   NEUTROABS 5.1 07/17/2018   HGB 11.3 (L) 07/17/2018   HCT 34.5 (L) 07/17/2018   MCV 91.0 07/17/2018   PLT 321 07/17/2018    @LASTCHEMISTRY @  No results found for: LABCA2  No components found for: JJOACZ660  No results for input(s): INR in the last 168 hours.  No results found for: LABCA2  No results found for: YTK160  No results found for: FUX323  No results found for: FTD322  No results found for: CA2729  No components found for: HGQUANT  No results found for: CEA1 / No results found for: CEA1   No results found for: AFPTUMOR  No results found for: CHROMOGRNA  No results found for: PSA1  Appointment on 07/17/2018  Component Date Value Ref Range Status   WBC 07/17/2018 8.1  4.0 - 10.5 K/uL Final   RBC 07/17/2018 3.79* 3.87 - 5.11 MIL/uL Final   Hemoglobin 07/17/2018 11.3* 12.0 - 15.0 g/dL Final   HCT 07/17/2018 34.5* 36.0 - 46.0 % Final   MCV 07/17/2018 91.0  80.0 - 100.0 fL Final   MCH 07/17/2018 29.8  26.0 - 34.0 pg Final   MCHC 07/17/2018 32.8  30.0 - 36.0 g/dL Final   RDW 07/17/2018 12.6  11.5  - 15.5 % Final   Platelets 07/17/2018 321  150 - 400 K/uL Final   nRBC 07/17/2018 0.0  0.0 - 0.2 % Final   Neutrophils Relative % 07/17/2018 63  % Final   Neutro Abs 07/17/2018 5.1  1.7 - 7.7 K/uL Final   Lymphocytes Relative 07/17/2018 27  % Final   Lymphs Abs 07/17/2018 2.2  0.7 - 4.0 K/uL Final   Monocytes  Relative 07/17/2018 8  % Final   Monocytes Absolute 07/17/2018 0.6  0.1 - 1.0 K/uL Final   Eosinophils Relative 07/17/2018 2  % Final   Eosinophils Absolute 07/17/2018 0.2  0.0 - 0.5 K/uL Final   Basophils Relative 07/17/2018 0  % Final   Basophils Absolute 07/17/2018 0.0  0.0 - 0.1 K/uL Final   Immature Granulocytes 07/17/2018 0  % Final   Abs Immature Granulocytes 07/17/2018 0.02  0.00 - 0.07 K/uL Final   Performed at [facility], [place] [address], [place], [place] [address]   Sodium 07/17/2018 140  135 - 145 mmol/L Final   Potassium 07/17/2018 3.8  3.5 - 5.1 mmol/L Final   Chloride 07/17/2018 105  98 - 111 mmol/L Final   CO2 07/17/2018 24  22 - 32 mmol/L Final   Glucose, Bld 07/17/2018 280* 70 - 99 mg/dL Final   BUN 07/17/2018 11  6 - 20 mg/dL Final   Creatinine, Ser 07/17/2018 1.06* 0.44 - 1.00 mg/dL Final   Calcium 07/17/2018 8.9  8.9 - 10.3 mg/dL Final   Total Protein 07/17/2018 7.7  6.5 - 8.1 g/dL Final   Albumin 07/17/2018 3.4* 3.5 - 5.0 g/dL Final   AST 07/17/2018 13* 15 - 41 U/L Final   ALT 07/17/2018 16  0 - 44 U/L Final   Alkaline Phosphatase 07/17/2018 114  38 - 126 U/L Final   Total Bilirubin 07/17/2018 0.3  0.3 - 1.2 mg/dL Final   GFR calc non Af Amer 07/17/2018 59* >60 mL/min Final   GFR calc Af Amer 07/17/2018 >60  >60 mL/min Final   Anion gap 07/17/2018 11  5 - 15 Final   Performed at [facility], [place] [address], [place], [place] [address]    (this displays the last labs from the last 3 days)  No results found for: TOTALPROTELP, ALBUMINELP, A1GS, A2GS, BETS, BETA2SER, GAMS,  MSPIKE, SPEI (this displays SPEP labs)  No results found for: KPAFRELGTCHN, LAMBDASER, KAPLAMBRATIO (kappa/lambda light chains)  No results found for: HGBA, HGBA2QUANT, HGBFQUANT, HGBSQUAN (Hemoglobinopathy evaluation)   No results found for: LDH  No results found for: IRON, TIBC, IRONPCTSAT (Iron and TIBC)  No results found for: FERRITIN  Urinalysis No results found for: COLORURINE, APPEARANCEUR, LABSPEC, PHURINE, GLUCOSEU, HGBUR, BILIRUBINUR, KETONESUR, PROTEINUR, UROBILINOGEN, NITRITE, LEUKOCYTESUR   STUDIES: Nm Sentinel Node Inj-no Rpt (breast)  Result Date: 06/26/2018 Sulfur colloid was injected by the nuclear medicine technologist for melanoma sentinel node.   Mm Breast Surgical Specimen  Result Date: 06/26/2018 CLINICAL DATA:  Specimen radiograph status post left breast lumpectomy. EXAM: SPECIMEN RADIOGRAPH OF THE LEFT BREAST COMPARISON:  Previous exam(s). FINDINGS: Status post excision of the left breast. The radioactive seed and biopsy marker clip are present and completely intact. These findings were communicated with the OR at 8:44 a.m. IMPRESSION: Specimen radiograph of the left breast. Electronically Signed   By: Ammie Ferrier M.D.   On: 06/26/2018 08:44   Dg Chest Port 1 View  Result Date: 06/26/2018 CLINICAL DATA:  Post op port a cath placement EXAM: PORTABLE CHEST - 1 VIEW COMPARISON:  none FINDINGS: Right IJ power-injectable port extends to the lower SVC. No pneumothorax. Small caliber metallic lead projects over the right lung apex. Lungs are clear. Low lung volumes. Heart size upper limits normal for technique. No effusion. Visualized bones unremarkable. IMPRESSION: Port catheter to lower SVC without pneumothorax. Electronically Signed   By: Lucrezia Europe M.D.   On: 06/26/2018 10:15  Dg Fluoro Guide Cv Line-no Report  Result Date: 06/26/2018 Fluoroscopy was utilized by the requesting physician.  No radiographic interpretation.   Mm Lt Radioactive Seed Loc  Mammo Guide  Result Date: 06/25/2018 CLINICAL DATA:  Pre lumpectomy localization of a recently diagnosed invasive ductal carcinoma in the 10 o'clock position of the left breast. EXAM: MAMMOGRAPHIC GUIDED RADIOACTIVE SEED LOCALIZATION OF THE LEFT BREAST COMPARISON:  Previous exam(s). FINDINGS: Patient presents for radioactive seed localization prior to left lumpectomy. I met with the patient and we discussed the procedure of seed localization including benefits and alternatives. We discussed the high likelihood of a successful procedure. We discussed the risks of the procedure including infection, bleeding, tissue injury and further surgery. We discussed the low dose of radioactivity involved in the procedure. Informed, written consent was given. The usual time-out protocol was performed immediately prior to the procedure. Using mammographic guidance, sterile technique, 1% lidocaine and an I-125 radioactive seed, the recently placed ribbon shaped biopsy marker clip in the 10 o'clock position of the left breast was localized using a medial approach. The follow-up mammogram images confirm the seed in the expected location and were marked for Dr. Donne Hazel. Follow-up survey of the patient confirms presence of the radioactive seed. Order number of I-125 seed:  [id number]. Total activity:  0.254 mCi reference Date: 06/11/2018 The patient tolerated the procedure well and was released from the [facility] [place]. She was given instructions regarding seed removal. IMPRESSION: Radioactive seed localization left breast. No apparent complications. Electronically Signed   By: Claudie Revering M.D.   On: 06/25/2018 14:09    ELIGIBLE FOR AVAILABLE RESEARCH PROTOCOL: no  ASSESSMENT: 53 y.o. [place] woman status post left breast upper inner quadrant biopsy 06/04/2018 for a clinical T1b N0, stage IB invasive ductal carcinoma, grade 3, estrogen receptor moderately positive, progesterone receptor negative, with an MIB-1 of 80%, and  HER-2 nonamplified  (1) left lumpectomy and sentinel lymph node sampling 06/26/2018 showed a pT1c pN0, stage IB invasive ductal carcinoma, grade 3, with negative margins  (a) repeat prognostic panel triple negative  (b) a total of 3 sentinel lymph nodes were removed  (2) Oncotype score of 56 obtained from the 06/04/2018 biopsy predicts a risk of outside the breast recurrence over the next 9 years of nearly 40% if the patient's only systemic therapy is antiestrogens for 5 years.  It also predicts a significant benefit from chemotherapy.  (a) Oncotype reads the tumor also as triple negative.  (3) adjuvant chemo therapy will consist of cyclophosphamide and Adriamycin in dose dense fashion x4 starting 07/17/2018, followed by weekly paclitaxel and carboplatin x12  (a) echo on 07/16/2018 shows EF of 60-65%  (4) adjuvant radiation to follow chemotherapy   PLAN: Cassandra Sanders is doing well.  I reviewed her labs with her.  We reviewed Doxorubicin and cyclophosphamide again today.  We reviewed risks/benefits and I gave her detailed information about her chemotherapy in her AVS.  She understands and is agreeable to proceed with treatment.    I gave Cassandra Sanders an updated anti nausea medication sheet.  We reviewed her anti emetics and due to her diabetes, I recommended that she take 1 tablet of her Dexamethasone twice a day for two days instead of two.  She will check her blood sugar 1-2 times a day so that we can follow it and adjust dexamethasone if needed.    Morgan and I talked about potential side effects.  We reviewed that should she have any questions or concerns, to please call  our office, as we are happy to talk her through any adverse effects.  She understands this.  Cassandra Sanders is keeping appropriate pandemic precautions and I recommended that she continue to do so.  We will see her next week for labs and follow up to evaluate how she tolerated her treatment She knows to call for any other issue that may develop  before the next visit.  A total of (30) minutes of face-to-face time was spent with this patient with greater than 50% of that time in counseling and care-coordination.   Wilber Bihari, NP  07/17/18 8:41 AM Medical Oncology and Hematology [facility] [address] [place], [place] [address] Tel. [phone]    Fax. [phone]

## 2018-07-17 NOTE — Patient Instructions (Signed)
Carboplatin injection What is this medicine? CARBOPLATIN (KAR boe pla tin) is a chemotherapy drug. It targets fast dividing cells, like cancer cells, and causes these cells to die. This medicine is used to treat ovarian cancer and many other cancers. This medicine may be used for other purposes; ask your health care provider or pharmacist if you have questions. COMMON BRAND NAME(S): Paraplatin What should I tell my health care provider before I take this medicine? They need to know if you have any of these conditions:  blood disorders  hearing problems  kidney disease  recent or ongoing radiation therapy  an unusual or allergic reaction to carboplatin, cisplatin, other chemotherapy, other medicines, foods, dyes, or preservatives  pregnant or trying to get pregnant  breast-feeding How should I use this medicine? This drug is usually given as an infusion into a vein. It is administered in a hospital or clinic by a specially trained health care professional. Talk to your pediatrician regarding the use of this medicine in children. Special care may be needed. Overdosage: If you think you have taken too much of this medicine contact a poison control center or emergency room at once. NOTE: This medicine is only for you. Do not share this medicine with others. What if I miss a dose? It is important not to miss a dose. Call your doctor or health care professional if you are unable to keep an appointment. What may interact with this medicine?  medicines for seizures  medicines to increase blood counts like filgrastim, pegfilgrastim, sargramostim  some antibiotics like amikacin, gentamicin, neomycin, streptomycin, tobramycin  vaccines Talk to your doctor or health care professional before taking any of these medicines:  acetaminophen  aspirin  ibuprofen  ketoprofen  naproxen This list may not describe all possible interactions. Give your health care provider a list of all the  medicines, herbs, non-prescription drugs, or dietary supplements you use. Also tell them if you smoke, drink alcohol, or use illegal drugs. Some items may interact with your medicine. What should I watch for while using this medicine? Your condition will be monitored carefully while you are receiving this medicine. You will need important blood work done while you are taking this medicine. This drug may make you feel generally unwell. This is not uncommon, as chemotherapy can affect healthy cells as well as cancer cells. Report any side effects. Continue your course of treatment even though you feel ill unless your doctor tells you to stop. In some cases, you may be given additional medicines to help with side effects. Follow all directions for their use. Call your doctor or health care professional for advice if you get a fever, chills or sore throat, or other symptoms of a cold or flu. Do not treat yourself. This drug decreases your body's ability to fight infections. Try to avoid being around people who are sick. This medicine may increase your risk to bruise or bleed. Call your doctor or health care professional if you notice any unusual bleeding. Be careful brushing and flossing your teeth or using a toothpick because you may get an infection or bleed more easily. If you have any dental work done, tell your dentist you are receiving this medicine. Avoid taking products that contain aspirin, acetaminophen, ibuprofen, naproxen, or ketoprofen unless instructed by your doctor. These medicines may hide a fever. Do not become pregnant while taking this medicine. Women should inform their doctor if they wish to become pregnant or think they might be pregnant. There is a  potential for serious side effects to an unborn child. Talk to your health care professional or pharmacist for more information. Do not breast-feed an infant while taking this medicine. What side effects may I notice from receiving this  medicine? Side effects that you should report to your doctor or health care professional as soon as possible:  allergic reactions like skin rash, itching or hives, swelling of the face, lips, or tongue  signs of infection - fever or chills, cough, sore throat, pain or difficulty passing urine  signs of decreased platelets or bleeding - bruising, pinpoint red spots on the skin, black, tarry stools, nosebleeds  signs of decreased red blood cells - unusually weak or tired, fainting spells, lightheadedness  breathing problems  changes in hearing  changes in vision  chest pain  high blood pressure  low blood counts - This drug may decrease the number of white blood cells, red blood cells and platelets. You may be at increased risk for infections and bleeding.  nausea and vomiting  pain, swelling, redness or irritation at the injection site  pain, tingling, numbness in the hands or feet  problems with balance, talking, walking  trouble passing urine or change in the amount of urine Side effects that usually do not require medical attention (report to your doctor or health care professional if they continue or are bothersome):  hair loss  loss of appetite  metallic taste in the mouth or changes in taste This list may not describe all possible side effects. Call your doctor for medical advice about side effects. You may report side effects to FDA at 1-800-FDA-1088. Where should I keep my medicine? This drug is given in a hospital or clinic and will not be stored at home. NOTE: This sheet is a summary. It may not cover all possible information. If you have questions about this medicine, talk to your doctor, pharmacist, or health care provider.  2020 Else[employer]/Gold Standard (2007-03-31 14:38:05) Paclitaxel injection What is this medicine? PACLITAXEL (PAK li TAX el) is a chemotherapy drug. It targets fast dividing cells, like cancer cells, and causes these cells to die. This  medicine is used to treat ovarian cancer, breast cancer, lung cancer, Kaposi's sarcoma, and other cancers. This medicine may be used for other purposes; ask your health care provider or pharmacist if you have questions. COMMON BRAND NAME(S): Onxol, Taxol What should I tell my health care provider before I take this medicine? They need to know if you have any of these conditions:  history of irregular heartbeat  liver disease  low blood counts, like low white cell, platelet, or red cell counts  lung or breathing disease, like asthma  tingling of the fingers or toes, or other nerve disorder  an unusual or allergic reaction to paclitaxel, alcohol, polyoxyethylated castor oil, other chemotherapy, other medicines, foods, dyes, or preservatives  pregnant or trying to get pregnant  breast-feeding How should I use this medicine? This drug is given as an infusion into a vein. It is administered in a hospital or clinic by a specially trained health care professional. Talk to your pediatrician regarding the use of this medicine in children. Special care may be needed. Overdosage: If you think you have taken too much of this medicine contact a poison control center or emergency room at once. NOTE: This medicine is only for you. Do not share this medicine with others. What if I miss a dose? It is important not to miss your dose. Call your doctor or  health care professional if you are unable to keep an appointment. What may interact with this medicine? Do not take this medicine with any of the following medications:  disulfiram  metronidazole This medicine may also interact with the following medications:  antiviral medicines for hepatitis, HIV or AIDS  certain antibiotics like erythromycin and clarithromycin  certain medicines for fungal infections like ketoconazole and itraconazole  certain medicines for seizures like carbamazepine, phenobarbital,  phenytoin  gemfibrozil  nefazodone  rifampin  St. John's wort This list may not describe all possible interactions. Give your health care provider a list of all the medicines, herbs, non-prescription drugs, or dietary supplements you use. Also tell them if you smoke, drink alcohol, or use illegal drugs. Some items may interact with your medicine. What should I watch for while using this medicine? Your condition will be monitored carefully while you are receiving this medicine. You will need important blood work done while you are taking this medicine. This medicine can cause serious allergic reactions. To reduce your risk you will need to take other medicine(s) before treatment with this medicine. If you experience allergic reactions like skin rash, itching or hives, swelling of the face, lips, or tongue, tell your doctor or health care professional right away. In some cases, you may be given additional medicines to help with side effects. Follow all directions for their use. This drug may make you feel generally unwell. This is not uncommon, as chemotherapy can affect healthy cells as well as cancer cells. Report any side effects. Continue your course of treatment even though you feel ill unless your doctor tells you to stop. Call your doctor or health care professional for advice if you get a fever, chills or sore throat, or other symptoms of a cold or flu. Do not treat yourself. This drug decreases your body's ability to fight infections. Try to avoid being around people who are sick. This medicine may increase your risk to bruise or bleed. Call your doctor or health care professional if you notice any unusual bleeding. Be careful brushing and flossing your teeth or using a toothpick because you may get an infection or bleed more easily. If you have any dental work done, tell your dentist you are receiving this medicine. Avoid taking products that contain aspirin, acetaminophen, ibuprofen,  naproxen, or ketoprofen unless instructed by your doctor. These medicines may hide a fever. Do not become pregnant while taking this medicine. Women should inform their doctor if they wish to become pregnant or think they might be pregnant. There is a potential for serious side effects to an unborn child. Talk to your health care professional or pharmacist for more information. Do not breast-feed an infant while taking this medicine. Men are advised not to father a child while receiving this medicine. This product may contain alcohol. Ask your pharmacist or healthcare provider if this medicine contains alcohol. Be sure to tell all healthcare providers you are taking this medicine. Certain medicines, like metronidazole and disulfiram, can cause an unpleasant reaction when taken with alcohol. The reaction includes flushing, headache, nausea, vomiting, sweating, and increased thirst. The reaction can last from 30 minutes to several hours. What side effects may I notice from receiving this medicine? Side effects that you should report to your doctor or health care professional as soon as possible:  allergic reactions like skin rash, itching or hives, swelling of the face, lips, or tongue  breathing problems  changes in vision  fast, irregular heartbeat  high or  low blood pressure  mouth sores  pain, tingling, numbness in the hands or feet  signs of decreased platelets or bleeding - bruising, pinpoint red spots on the skin, black, tarry stools, blood in the urine  signs of decreased red blood cells - unusually weak or tired, feeling faint or lightheaded, falls  signs of infection - fever or chills, cough, sore throat, pain or difficulty passing urine  signs and symptoms of liver injury like dark yellow or brown urine; general ill feeling or flu-like symptoms; light-colored stools; loss of appetite; nausea; right upper belly pain; unusually weak or tired; yellowing of the eyes or  skin  swelling of the ankles, feet, hands  unusually slow heartbeat Side effects that usually do not require medical attention (report to your doctor or health care professional if they continue or are bothersome):  diarrhea  hair loss  loss of appetite  muscle or joint pain  nausea, vomiting  pain, redness, or irritation at site where injected  tiredness This list may not describe all possible side effects. Call your doctor for medical advice about side effects. You may report side effects to FDA at 1-800-FDA-1088. Where should I keep my medicine? This drug is given in a hospital or clinic and will not be stored at home. NOTE: This sheet is a summary. It may not cover all possible information. If you have questions about this medicine, talk to your doctor, pharmacist, or health care provider.  2020 Elsevier/Gold Standard (2016-08-27 13:14:55) Cyclophosphamide injection What is this medicine? CYCLOPHOSPHAMIDE (sye kloe FOSS fa mide) is a chemotherapy drug. It slows the growth of cancer cells. This medicine is used to treat many types of cancer like lymphoma, myeloma, leukemia, breast cancer, and ovarian cancer, to name a few. This medicine may be used for other purposes; ask your health care provider or pharmacist if you have questions. COMMON BRAND NAME(S): Cytoxan, Neosar What should I tell my health care provider before I take this medicine? They need to know if you have any of these conditions:  blood disorders  history of other chemotherapy  infection  kidney disease  liver disease  recent or ongoing radiation therapy  tumors in the bone marrow  an unusual or allergic reaction to cyclophosphamide, other chemotherapy, other medicines, foods, dyes, or preservatives  pregnant or trying to get pregnant  breast-feeding How should I use this medicine? This drug is usually given as an injection into a vein or muscle or by infusion into a vein. It is administered in  a hospital or clinic by a specially trained health care professional. Talk to your pediatrician regarding the use of this medicine in children. Special care may be needed. Overdosage: If you think you have taken too much of this medicine contact a poison control center or emergency room at once. NOTE: This medicine is only for you. Do not share this medicine with others. What if I miss a dose? It is important not to miss your dose. Call your doctor or health care professional if you are unable to keep an appointment. What may interact with this medicine? This medicine may interact with the following medications:  amiodarone  amphotericin B  azathioprine  certain antiviral medicines for HIV or AIDS such as protease inhibitors (e.g., indinavir, ritonavir) and zidovudine  certain blood pressure medications such as benazepril, captopril, enalapril, fosinopril, lisinopril, moexipril, monopril, perindopril, quinapril, ramipril, trandolapril  certain cancer medications such as anthracyclines (e.g., daunorubicin, doxorubicin), busulfan, cytarabine, paclitaxel, pentostatin, tamoxifen, trastuzumab  certain  diuretics such as chlorothiazide, chlorthalidone, hydrochlorothiazide, indapamide, metolazone  certain medicines that treat or prevent blood clots like warfarin  certain muscle relaxants such as succinylcholine  cyclosporine  etanercept  indomethacin  medicines to increase blood counts like filgrastim, pegfilgrastim, sargramostim  medicines used as general anesthesia  metronidazole  natalizumab This list may not describe all possible interactions. Give your health care provider a list of all the medicines, herbs, non-prescription drugs, or dietary supplements you use. Also tell them if you smoke, drink alcohol, or use illegal drugs. Some items may interact with your medicine. What should I watch for while using this medicine? Visit your doctor for checks on your progress. This drug  may make you feel generally unwell. This is not uncommon, as chemotherapy can affect healthy cells as well as cancer cells. Report any side effects. Continue your course of treatment even though you feel ill unless your doctor tells you to stop. Drink water or other fluids as directed. Urinate often, even at night. In some cases, you may be given additional medicines to help with side effects. Follow all directions for their use. Call your doctor or health care professional for advice if you get a fever, chills or sore throat, or other symptoms of a cold or flu. Do not treat yourself. This drug decreases your body's ability to fight infections. Try to avoid being around people who are sick. This medicine may increase your risk to bruise or bleed. Call your doctor or health care professional if you notice any unusual bleeding. Be careful brushing and flossing your teeth or using a toothpick because you may get an infection or bleed more easily. If you have any dental work done, tell your dentist you are receiving this medicine. You may get drowsy or dizzy. Do not drive, use machinery, or do anything that needs mental alertness until you know how this medicine affects you. Do not become pregnant while taking this medicine or for 1 year after stopping it. Women should inform their doctor if they wish to become pregnant or think they might be pregnant. Men should not father a child while taking this medicine and for 4 months after stopping it. There is a potential for serious side effects to an unborn child. Talk to your health care professional or pharmacist for more information. Do not breast-feed an infant while taking this medicine. This medicine may interfere with the ability to have a child. This medicine has caused ovarian failure in some women. This medicine has caused reduced sperm counts in some men. You should talk with your doctor or health care professional if you are concerned about your  fertility. If you are going to have surgery, tell your doctor or health care professional that you have taken this medicine. What side effects may I notice from receiving this medicine? Side effects that you should report to your doctor or health care professional as soon as possible:  allergic reactions like skin rash, itching or hives, swelling of the face, lips, or tongue  low blood counts - this medicine may decrease the number of white blood cells, red blood cells and platelets. You may be at increased risk for infections and bleeding.  signs of infection - fever or chills, cough, sore throat, pain or difficulty passing urine  signs of decreased platelets or bleeding - bruising, pinpoint red spots on the skin, black, tarry stools, blood in the urine  signs of decreased red blood cells - unusually weak or tired, fainting spells, lightheadedness  breathing problems  dark urine  dizziness  palpitations  swelling of the ankles, feet, hands  trouble passing urine or change in the amount of urine  weight gain  yellowing of the eyes or skin Side effects that usually do not require medical attention (report to your doctor or health care professional if they continue or are bothersome):  changes in nail or skin color  hair loss  missed menstrual periods  mouth sores  nausea, vomiting This list may not describe all possible side effects. Call your doctor for medical advice about side effects. You may report side effects to FDA at 1-800-FDA-1088. Where should I keep my medicine? This drug is given in a hospital or clinic and will not be stored at home. NOTE: This sheet is a summary. It may not cover all possible information. If you have questions about this medicine, talk to your doctor, pharmacist, or health care provider.  2020 [employer]/Gold Standard (2011-11-08 16:22:58) Doxorubicin injection What is this medicine? DOXORUBICIN (dox oh ROO bi sin) is a chemotherapy drug.  It is used to treat many kinds of cancer like leukemia, lymphoma, neuroblastoma, sarcoma, and Wilms' tumor. It is also used to treat bladder cancer, breast cancer, lung cancer, ovarian cancer, stomach cancer, and thyroid cancer. This medicine may be used for other purposes; ask your health care provider or pharmacist if you have questions. COMMON BRAND NAME(S): Adriamycin, Adriamycin PFS, Adriamycin RDF, Rubex What should I tell my health care provider before I take this medicine? They need to know if you have any of these conditions:  heart disease  history of low blood counts caused by a medicine  liver disease  recent or ongoing radiation therapy  an unusual or allergic reaction to doxorubicin, other chemotherapy agents, other medicines, foods, dyes, or preservatives  pregnant or trying to get pregnant  breast-feeding How should I use this medicine? This drug is given as an infusion into a vein. It is administered in a hospital or clinic by a specially trained health care professional. If you have pain, swelling, burning or any unusual feeling around the site of your injection, tell your health care professional right away. Talk to your pediatrician regarding the use of this medicine in children. Special care may be needed. Overdosage: If you think you have taken too much of this medicine contact a poison control center or emergency room at once. NOTE: This medicine is only for you. Do not share this medicine with others. What if I miss a dose? It is important not to miss your dose. Call your doctor or health care professional if you are unable to keep an appointment. What may interact with this medicine? This medicine may interact with the following medications:  6-mercaptopurine  paclitaxel  phenytoin  St. John's Wort  trastuzumab  verapamil This list may not describe all possible interactions. Give your health care provider a list of all the medicines, herbs,  non-prescription drugs, or dietary supplements you use. Also tell them if you smoke, drink alcohol, or use illegal drugs. Some items may interact with your medicine. What should I watch for while using this medicine? This drug may make you feel generally unwell. This is not uncommon, as chemotherapy can affect healthy cells as well as cancer cells. Report any side effects. Continue your course of treatment even though you feel ill unless your doctor tells you to stop. There is a maximum amount of this medicine you should receive throughout your life. The amount depends on the  medical condition being treated and your overall health. Your doctor will watch how much of this medicine you receive in your lifetime. Tell your doctor if you have taken this medicine before. You may need blood work done while you are taking this medicine. Your urine may turn red for a few days after your dose. This is not blood. If your urine is dark or brown, call your doctor. In some cases, you may be given additional medicines to help with side effects. Follow all directions for their use. Call your doctor or health care professional for advice if you get a fever, chills or sore throat, or other symptoms of a cold or flu. Do not treat yourself. This drug decreases your body's ability to fight infections. Try to avoid being around people who are sick. This medicine may increase your risk to bruise or bleed. Call your doctor or health care professional if you notice any unusual bleeding. Talk to your doctor about your risk of cancer. You may be more at risk for certain types of cancers if you take this medicine. Do not become pregnant while taking this medicine or for 6 months after stopping it. Women should inform their doctor if they wish to become pregnant or think they might be pregnant. Men should not father a child while taking this medicine and for 6 months after stopping it. There is a potential for serious side effects to  an unborn child. Talk to your health care professional or pharmacist for more information. Do not breast-feed an infant while taking this medicine. This medicine has caused ovarian failure in some women and reduced sperm counts in some men This medicine may interfere with the ability to have a child. Talk with your doctor or health care professional if you are concerned about your fertility. This medicine may cause a decrease in Co-Enzyme Q-10. You should make sure that you get enough Co-Enzyme Q-10 while you are taking this medicine. Discuss the foods you eat and the vitamins you take with your health care professional. What side effects may I notice from receiving this medicine? Side effects that you should report to your doctor or health care professional as soon as possible:  allergic reactions like skin rash, itching or hives, swelling of the face, lips, or tongue  breathing problems  chest pain  fast or irregular heartbeat  low blood counts - this medicine may decrease the number of white blood cells, red blood cells and platelets. You may be at increased risk for infections and bleeding.  pain, redness, or irritation at site where injected  signs of infection - fever or chills, cough, sore throat, pain or difficulty passing urine  signs of decreased platelets or bleeding - bruising, pinpoint red spots on the skin, black, tarry stools, blood in the urine  swelling of the ankles, feet, hands  tiredness  weakness Side effects that usually do not require medical attention (report to your doctor or health care professional if they continue or are bothersome):  diarrhea  hair loss  mouth sores  nail discoloration or damage  nausea  red colored urine  vomiting This list may not describe all possible side effects. Call your doctor for medical advice about side effects. You may report side effects to FDA at 1-800-FDA-1088. Where should I keep my medicine? This drug is given  in a hospital or clinic and will not be stored at home. NOTE: This sheet is a summary. It may not cover all possible information. If  you have questions about this medicine, talk to your doctor, pharmacist, or health care provider.  2020 Elsevier/Gold Standard (2016-08-07 11:01:26)

## 2018-07-18 ENCOUNTER — Inpatient Hospital Stay: Payer: Federal, State, Local not specified - PPO

## 2018-07-20 ENCOUNTER — Encounter: Payer: Self-pay | Admitting: *Deleted

## 2018-07-20 ENCOUNTER — Telehealth: Payer: Self-pay | Admitting: Adult Health

## 2018-07-20 ENCOUNTER — Encounter: Payer: Self-pay | Admitting: Oncology

## 2018-07-20 ENCOUNTER — Telehealth: Payer: Self-pay | Admitting: *Deleted

## 2018-07-20 NOTE — Telephone Encounter (Signed)
I talk with patient regarding schedule  

## 2018-07-20 NOTE — Telephone Encounter (Signed)
Spoke with patient to follow up after her 1st chemo 7/10.  Patient states she is doing well.  She is drinking and eating well.  She takes walks everyday. Reviewed her appointments with her.  Encouraged her to call with any needs or concerns.

## 2018-07-21 NOTE — Progress Notes (Signed)
[facility]  Telephone:([phone]) [phone] [phone]     ID: Cassandra Sanders DOB: 06/04/1965  MR#: [id number]  GEX#:[id number]  Patient Care Team: Lennie Odor, PA-C as PCP - General (Nurse Practitioner) Rockwell Germany, RN as Oncology Nurse Navigator Mauro Kaufmann, RN as Oncology Nurse Navigator Tammye Kahler, Virgie Dad, MD as Consulting Physician (Oncology) Rolm Bookbinder, MD as Consulting Physician (General Surgery) Kyung Rudd, MD as Consulting Physician (Radiation Oncology) Marylynn Pearson, MD as Consulting Physician (Obstetrics and Gynecology) Chauncey Cruel, MD OTHER MD:   CHIEF COMPLAINT: Triple negative breast cancer  CURRENT TREATMENT: Adjuvant chemotherapy   INTERVAL HISTORY: Cassandra Sanders returns today for follow-up and treatment of her triple negative breast cancer.    She began on adjuvant chemotherapy on 07/17/2018 with dose dense doxorubicin and cyclophosphamide every 14 days x4. Today is day 6 cycle 1. She reports her first infusion went well. She states the process was pleasant and comfortable, thanks to the nursing staff. She reports she felt great until yesterday. She experienced some stress, stating a feeling of discomfort in her chest/abdomen. She describes it as "feeling like you need to burp, but you can't. Or like you need to vomit but can't." So she picked up medicine for reflux. She denies diarrhea or constipation, mouth sores, urinary issues, and issues with her port. She does note her sense of taste is very off, to the point of having to make herself eat yesterday. She states she now enjoys strawberries, bananas, and spinach. She has also reported skin darkening in her hands.   She receives onpro support on day 2. She denies any bony aches.  Her most recent echo on 07/16/2018 showed an EF of 60-65%.   REVIEW OF SYSTEMS: Cassandra Sanders reports her sugars have been a little high. She has been stretching her arms to prevent any lymphedema. Aside from  these and the side effects above, a detailed ROS was otherwise non contributory.     HISTORY OF CURRENT ILLNESS: From the original intake note:  Cassandra Sanders had routine screening mammography on 05/15/2018 at [facility] showing a possible abnormality in the left breast. She underwent bilateral diagnostic mammography with tomography and left breast ultrasonography at [facility] on 06/02/2018 showing: breast density category B; suspicious approximate 0.9 cm mass involving the upper inner quadrant of the left breast at posterior depth which account for the screening mammographic finding; no pathologic left axillary lymphadenopathy.  Accordingly on 06/04/2018 she proceeded to biopsy of the left breast area in question. The pathology from this procedure ([id number]) showed: invasive ductal carcinoma, grade 3. Prognostic indicators significant for: estrogen receptor, 60% positive with moderate staining intensity and progesterone receptor, 0% negative. Proliferation marker Ki67 at 80%. HER2 negative by immunohistochemistry (0).  The patient's subsequent history is as detailed below.   PAST MEDICAL HISTORY: Past Medical History:  Diagnosis Date   Breast cancer ([place]) 06/04/2018   left breast   Diabetes mellitus without complication ([place])    Hypertension     PAST SURGICAL HISTORY: Past Surgical History:  Procedure Laterality Date   BREAST LUMPECTOMY WITH RADIOACTIVE SEED AND SENTINEL LYMPH NODE BIOPSY Left 06/26/2018   Procedure: LEFT BREAST LUMPECTOMY WITH RADIOACTIVE SEED AND LEFT AXILLARY SENTINEL LYMPH NODE BIOPSY;  Surgeon: Rolm Bookbinder, MD;  Location: [facility];  Service: General;  Laterality: Left;   PORTACATH PLACEMENT Right 06/26/2018   Procedure: INSERTION PORT-A-CATH WITH ULTRASOUND;  Surgeon: Rolm Bookbinder, MD;  Location: [facility];  Service: General;  Laterality: Right;    FAMILY HISTORY: No family history on  file. Patient's father was in his 60s when he was murdered. Patient's mother is currently (as of 06/2018) at age 68. The patient denies a family hx of breast or ovarian cancer. She has 3 brothers and no biological sisters.   GYNECOLOGIC HISTORY:  Patient's last menstrual period was 06/18/2013. Menarche: 53 years old Age at first live birth: 53 years old Barry P 1 LMP June 2015 Contraceptive used from age [age] with no complications HRT none  Hysterectomy? no BSO? 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 She is engaged to [facility]. She lives at home with her son Cassandra Sanders, age 42, who works here in [place] as a Museum/gallery exhibitions officer.  The patient attends [employer].    ADVANCED DIRECTIVES: not in place; at the 06/10/2018 visit the patient was given the appropriate documents to complete and notarize at her discretion.  She intends to name her son is her healthcare power of attorney   HEALTH MAINTENANCE: Social History   Tobacco Use   Smoking status: Never Smoker   Smokeless tobacco: Never Used  Substance Use Topics   Alcohol use: No   Drug use: No     Colonoscopy: 2016  PAP: 05/15/2018, negative  Bone density: never done   No Known Allergies  Current Outpatient Medications  Medication Sig Dispense Refill   aspirin EC 81 MG tablet Take 81 mg by mouth daily.     dexamethasone (DECADRON) 4 MG tablet Take 2 tablets (8 mg total) by mouth 2 (two) times daily. Start the day before Taxotere. Then again the day after chemo for 3 days. 30 tablet 1   dexamethasone (DECADRON) 4 MG tablet Take 2 tablets by mouth once a day on the day after chemotherapy and then take 2 tablets two times a day for 2 days. Take with food. 30 tablet 1   JANUVIA 100 MG tablet daily.      lidocaine-prilocaine (EMLA) cream Apply to affected area once 30 g 3   lidocaine-prilocaine (EMLA) cream Apply to  affected area once 30 g 3   lisinopril (PRINIVIL,ZESTRIL) 10 MG tablet Take 10 mg by mouth daily.  2   loratadine (CLARITIN) 10 MG tablet Take 1 tablet (10 mg total) by mouth daily. 90 tablet 1   LORazepam (ATIVAN) 0.5 MG tablet Take 1 tablet (0.5 mg total) by mouth at bedtime as needed and may repeat dose one time if needed (Nausea or vomiting). 30 tablet 0   METFORMIN HCL PO Take 1,000 mg by mouth daily.      oxyCODONE (OXY IR/ROXICODONE) 5 MG immediate release tablet Take 1 tablet (5 mg total) by mouth every 6 (six) hours as needed for moderate pain, severe pain or breakthrough pain. 12 tablet 0   prochlorperazine (COMPAZINE) 10 MG tablet Take 1 tablet (10 mg total) by mouth every 6 (six) hours as needed (Nausea or vomiting). 30 tablet 1   prochlorperazine (COMPAZINE) 10 MG tablet Take 1 tablet (10 mg total) by mouth every 6 (six) hours as needed (Nausea or vomiting). 30 tablet 1   No current facility-administered medications for this visit.     OBJECTIVE: Morbidly obese African-American woman in no acute distress  Vitals:   07/22/18 0932  BP: (!) 157/92  Pulse: (!) 116  Resp: 18  Temp: 98.3 F (36.8 C)  SpO2: 99%  Body mass index is 42.34 kg/m.  Wt Readings from Last 3 Encounters:  07/22/18 286 lb 11.2 oz (130 kg)  07/17/18 288 lb 4.8 oz (130.8 kg)  07/14/18 288 lb 4.8 oz (130.8 kg)  ECOG FS:1 - Symptomatic but completely ambulatory GENERAL: Patient is a well appearing female in no acute distress HEENT:  Sclerae anicteric.  Oropharynx clear and moist. No ulcerations or evidence of oropharyngeal candidiasis. Neck is supple.  NODES:  No cervical, supraclavicular, or axillary lymphadenopathy palpated.  BREAST EXAM:  Deferred. LUNGS:  Clear to auscultation bilaterally.  No wheezes or rhonchi. HEART:  Regular rate and rhythm. No murmur appreciated. ABDOMEN:  Soft, nontender.  Positive, normoactive bowel sounds. No organomegaly palpated. MSK:  No focal spinal tenderness to  palpation. Full range of motion bilaterally in the upper extremities. EXTREMITIES:  No peripheral edema.   SKIN:  Clear with no obvious rashes or skin changes. No nail dyscrasia. NEURO:  Nonfocal. Well oriented.  Appropriate affect.   LAB RESULTS:  CMP     Component Value Date/Time   NA 137 07/22/2018 0917   K 4.2 07/22/2018 0917   CL 99 07/22/2018 0917   CO2 28 07/22/2018 0917   GLUCOSE 199 (H) 07/22/2018 0917   BUN 14 07/22/2018 0917   CREATININE 1.02 (H) 07/22/2018 0917   CREATININE 1.14 (H) 06/10/2018 0859   CALCIUM 8.8 (L) 07/22/2018 0917   PROT 7.7 07/22/2018 0917   ALBUMIN 3.7 07/22/2018 0917   AST 14 (L) 07/22/2018 0917   AST 13 (L) 06/10/2018 0859   ALT 21 07/22/2018 0917   ALT 17 06/10/2018 0859   ALKPHOS 160 (H) 07/22/2018 0917   BILITOT 0.4 07/22/2018 0917   BILITOT 0.3 06/10/2018 0859   GFRNONAA >60 07/22/2018 0917   GFRNONAA 54 (L) 06/10/2018 0859   GFRAA >60 07/22/2018 0917   GFRAA >60 06/10/2018 0859    No results found for: TOTALPROTELP, ALBUMINELP, A1GS, A2GS, BETS, BETA2SER, GAMS, MSPIKE, SPEI  No results found for: KPAFRELGTCHN, LAMBDASER, [facility]  Lab Results  Component Value Date   WBC 6.3 07/22/2018   NEUTROABS PENDING 07/22/2018   HGB 12.1 07/22/2018   HCT 36.7 07/22/2018   MCV 89.5 07/22/2018   PLT 269 07/22/2018    _0 @  No results found for: LABCA2  No components found for: [id number]  No results for input(s): INR in the last 168 hours.  No results found for: LABCA2  No results found for: ZRA076  No results found for: AUQ333  No results found for: LKT625  No results found for: CA2729  No components found for: HGQUANT  No results found for: CEA1 / No results found for: CEA1   No results found for: AFPTUMOR  No results found for: CHROMOGRNA  No results found for: PSA1  Appointment on 07/22/2018  Component Date Value Ref Range Status   Sodium 07/22/2018 137  135 - 145 mmol/L Final   Potassium  07/22/2018 4.2  3.5 - 5.1 mmol/L Final   Chloride 07/22/2018 99  98 - 111 mmol/L Final   CO2 07/22/2018 28  22 - 32 mmol/L Final   Glucose, Bld 07/22/2018 199* 70 - 99 mg/dL Final   BUN 07/22/2018 14  6 - 20 mg/dL Final   Creatinine, Ser 07/22/2018 1.02* 0.44 - 1.00 mg/dL Final   Calcium 07/22/2018 8.8* 8.9 - 10.3 mg/dL Final   Total Protein 07/22/2018 7.7  6.5 - 8.1 g/dL Final   Albumin 07/22/2018 3.7  3.5 - 5.0 g/dL Final  AST 07/22/2018 14* 15 - 41 U/L Final   ALT 07/22/2018 21  0 - 44 U/L Final   Alkaline Phosphatase 07/22/2018 160* 38 - 126 U/L Final   Total Bilirubin 07/22/2018 0.4  0.3 - 1.2 mg/dL Final   GFR calc non Af Amer 07/22/2018 >60  >60 mL/min Final   GFR calc Af Amer 07/22/2018 >60  >60 mL/min Final   Anion gap 07/22/2018 10  5 - 15 Final   Performed at [facility], [place] [address] [place], [place] [address]   WBC 07/22/2018 6.3  4.0 - 10.5 K/uL Final   RBC 07/22/2018 4.10  3.87 - 5.11 MIL/uL Final   Hemoglobin 07/22/2018 12.1  12.0 - 15.0 g/dL Final   HCT 07/22/2018 36.7  36.0 - 46.0 % Final   MCV 07/22/2018 89.5  80.0 - 100.0 fL Final   MCH 07/22/2018 29.5  26.0 - 34.0 pg Final   MCHC 07/22/2018 33.0  30.0 - 36.0 g/dL Final   RDW 07/22/2018 12.3  11.5 - 15.5 % Final   Platelets 07/22/2018 269  150 - 400 K/uL Final   nRBC 07/22/2018 0.0  0.0 - 0.2 % Final   Performed at [facility], [place] [address] [place], [place] [address]   Neutrophils Relative % 07/22/2018 PENDING  % Incomplete   Neutro Abs 07/22/2018 PENDING  1.7 - 7.7 K/uL Incomplete   Band Neutrophils 07/22/2018 PENDING  % Incomplete   Lymphocytes Relative 07/22/2018 PENDING  % Incomplete   Lymphs Abs 07/22/2018 PENDING  0.7 - 4.0 K/uL Incomplete   Monocytes Relative 07/22/2018 PENDING  % Incomplete   Monocytes Absolute 07/22/2018 PENDING  0.1 - 1.0 K/uL Incomplete   Eosinophils Relative 07/22/2018 PENDING  % Incomplete     Eosinophils Absolute 07/22/2018 PENDING  0.0 - 0.5 K/uL Incomplete   Basophils Relative 07/22/2018 PENDING  % Incomplete   Basophils Absolute 07/22/2018 PENDING  0.0 - 0.1 K/uL Incomplete   WBC Morphology 07/22/2018 PENDING   Incomplete   RBC Morphology 07/22/2018 PENDING   Incomplete   Smear Review 07/22/2018 PENDING   Incomplete   Other 07/22/2018 PENDING  % Incomplete   nRBC 07/22/2018 PENDING  0 /100 WBC Incomplete   Metamyelocytes Relative 07/22/2018 PENDING  % Incomplete   Myelocytes 07/22/2018 PENDING  % Incomplete   Promyelocytes Relative 07/22/2018 PENDING  % Incomplete   Blasts 07/22/2018 PENDING  % Incomplete    (this displays the last labs from the last 3 days)  No results found for: TOTALPROTELP, ALBUMINELP, A1GS, A2GS, BETS, BETA2SER, GAMS, MSPIKE, SPEI (this displays SPEP labs)  No results found for: KPAFRELGTCHN, LAMBDASER, KAPLAMBRATIO (kappa/lambda light chains)  No results found for: HGBA, HGBA2QUANT, HGBFQUANT, HGBSQUAN (Hemoglobinopathy evaluation)   No results found for: LDH  No results found for: IRON, TIBC, IRONPCTSAT (Iron and TIBC)  No results found for: FERRITIN  Urinalysis No results found for: COLORURINE, APPEARANCEUR, LABSPEC, PHURINE, GLUCOSEU, HGBUR, BILIRUBINUR, KETONESUR, PROTEINUR, UROBILINOGEN, NITRITE, LEUKOCYTESUR   STUDIES: Nm Sentinel Node Inj-no Rpt (breast)  Result Date: 06/26/2018 Sulfur colloid was injected by the nuclear medicine technologist for melanoma sentinel node.   Mm Breast Surgical Specimen  Result Date: 06/26/2018 CLINICAL DATA:  Specimen radiograph status post left breast lumpectomy. EXAM: SPECIMEN RADIOGRAPH OF THE LEFT BREAST COMPARISON:  Previous exam(s). FINDINGS: Status post excision of the left breast. The radioactive seed and biopsy marker clip are present and completely intact. These findings were communicated with the OR at 8:44 a.m. IMPRESSION: Specimen  radiograph of the left breast.  Electronically Signed   By: Ammie Ferrier M.D.   On: 06/26/2018 08:44   Dg Chest Port 1 View  Result Date: 06/26/2018 CLINICAL DATA:  Post op port a cath placement EXAM: PORTABLE CHEST - 1 VIEW COMPARISON:  none FINDINGS: Right IJ power-injectable port extends to the lower SVC. No pneumothorax. Small caliber metallic lead projects over the right lung apex. Lungs are clear. Low lung volumes. Heart size upper limits normal for technique. No effusion. Visualized bones unremarkable. IMPRESSION: Port catheter to lower SVC without pneumothorax. Electronically Signed   By: Lucrezia Europe M.D.   On: 06/26/2018 10:15   Dg Fluoro Guide Cv Line-no Report  Result Date: 06/26/2018 Fluoroscopy was utilized by the requesting physician.  No radiographic interpretation.   Mm Lt Radioactive Seed Loc Mammo Guide  Result Date: 06/25/2018 CLINICAL DATA:  Pre lumpectomy localization of a recently diagnosed invasive ductal carcinoma in the 10 o'clock position of the left breast. EXAM: MAMMOGRAPHIC GUIDED RADIOACTIVE SEED LOCALIZATION OF THE LEFT BREAST COMPARISON:  Previous exam(s). FINDINGS: Patient presents for radioactive seed localization prior to left lumpectomy. I met with the patient and we discussed the procedure of seed localization including benefits and alternatives. We discussed the high likelihood of a successful procedure. We discussed the risks of the procedure including infection, bleeding, tissue injury and further surgery. We discussed the low dose of radioactivity involved in the procedure. Informed, written consent was given. The usual time-out protocol was performed immediately prior to the procedure. Using mammographic guidance, sterile technique, 1% lidocaine and an I-125 radioactive seed, the recently placed ribbon shaped biopsy marker clip in the 10 o'clock position of the left breast was localized using a medial approach. The follow-up mammogram images confirm the seed in the expected location and  were marked for Dr. Donne Hazel. Follow-up survey of the patient confirms presence of the radioactive seed. Order number of I-125 seed:  [id number]. Total activity:  0.254 mCi reference Date: 06/11/2018 The patient tolerated the procedure well and was released from the [facility]. She was given instructions regarding seed removal. IMPRESSION: Radioactive seed localization left breast. No apparent complications. Electronically Signed   By: Claudie Revering M.D.   On: 06/25/2018 14:09    ELIGIBLE FOR AVAILABLE RESEARCH PROTOCOL: no  ASSESSMENT: 53 y.o. [place] woman status post left breast upper inner quadrant biopsy 06/04/2018 for a clinical T1b N0, stage IB invasive ductal carcinoma, grade 3, estrogen receptor moderately positive, progesterone receptor negative, with an MIB-1 of 80%, and HER-2 nonamplified  (1) left lumpectomy and sentinel lymph node sampling 06/26/2018 showed a pT1c pN0, stage IB invasive ductal carcinoma, grade 3, with negative margins  (a) repeat prognostic panel triple negative  (b) a total of 3 sentinel lymph nodes were removed  (2) Oncotype score of 56 obtained from the 06/04/2018 biopsy predicts a risk of outside the breast recurrence over the next 9 years of nearly 40% if the patient's only systemic therapy is antiestrogens for 5 years.  It also predicts a significant benefit from chemotherapy.  (a) Oncotype reads the tumor also as triple negative.  (3) adjuvant chemo therapy will consist of cyclophosphamide and doxorubicin in dose dense fashion x4 starting 07/17/2018, followed by weekly paclitaxel and carboplatin x12  (a) echo on 07/16/2018 shows EF of 60-65%  (4) adjuvant radiation to follow chemotherapy  (5) consider [id number] study depending on residual disease after neoadjuvant treatment   PLAN: Kailen did remarkably well with her first cycle of doxorubicin  and cyclophosphamide.  She followed all the instructions to the letter and as a result has had minimal  symptoms.  Her counts today are very favorable.  Nevertheless she knows to call if she develops a temperature or any other unusual symptoms  We discussed whether it would be convenient or useful for her to see [place] in between each [facility] treatment and she likes the idea of being seen 7 or 8 days after treatment to make sure there have been no unusual developments.  Accordingly we will make sure that happens  We discussed taste alteration and she is going to try adding a little bit of vinegar or lemon juice to her food and see if that helps  I have encouraged her walking program.     Virgie Dad. Tianna Baus, MD  07/22/18 10:08 AM Medical Oncology and Hematology [facility] [address] [place], [place] [address] Tel. [phone]    Fax. [phone]   I, Wilburn Mylar, am acting as scribe for Dr. Virgie Dad. Jaquisha Frech.  I, Lurline Del MD, have reviewed the above documentation for accuracy and completeness, and I agree with the above.

## 2018-07-22 ENCOUNTER — Other Ambulatory Visit: Payer: Self-pay | Admitting: *Deleted

## 2018-07-22 ENCOUNTER — Inpatient Hospital Stay: Payer: Federal, State, Local not specified - PPO

## 2018-07-22 ENCOUNTER — Other Ambulatory Visit: Payer: Self-pay

## 2018-07-22 ENCOUNTER — Inpatient Hospital Stay: Payer: Federal, State, Local not specified - PPO | Admitting: Oncology

## 2018-07-22 ENCOUNTER — Telehealth: Payer: Self-pay | Admitting: *Deleted

## 2018-07-22 VITALS — BP 157/92 | HR 116 | Temp 98.3°F | Resp 18 | Ht 69.0 in | Wt 286.7 lb

## 2018-07-22 DIAGNOSIS — Z79899 Other long term (current) drug therapy: Secondary | ICD-10-CM | POA: Diagnosis not present

## 2018-07-22 DIAGNOSIS — E119 Type 2 diabetes mellitus without complications: Secondary | ICD-10-CM | POA: Diagnosis not present

## 2018-07-22 DIAGNOSIS — Z7982 Long term (current) use of aspirin: Secondary | ICD-10-CM | POA: Diagnosis not present

## 2018-07-22 DIAGNOSIS — Z170 Estrogen receptor positive status [ER+]: Secondary | ICD-10-CM

## 2018-07-22 DIAGNOSIS — Z5189 Encounter for other specified aftercare: Secondary | ICD-10-CM | POA: Diagnosis not present

## 2018-07-22 DIAGNOSIS — I10 Essential (primary) hypertension: Secondary | ICD-10-CM

## 2018-07-22 DIAGNOSIS — C50212 Malignant neoplasm of upper-inner quadrant of left female breast: Secondary | ICD-10-CM | POA: Diagnosis not present

## 2018-07-22 DIAGNOSIS — Z7984 Long term (current) use of oral hypoglycemic drugs: Secondary | ICD-10-CM | POA: Diagnosis not present

## 2018-07-22 DIAGNOSIS — Z5111 Encounter for antineoplastic chemotherapy: Secondary | ICD-10-CM | POA: Diagnosis not present

## 2018-07-22 LAB — CBC WITH DIFFERENTIAL/PLATELET
Abs Immature Granulocytes: 0 10*3/uL (ref 0.00–0.07)
Band Neutrophils: 2 %
Basophils Absolute: 0 10*3/uL (ref 0.0–0.1)
Basophils Relative: 0 %
Eosinophils Absolute: 0.1 10*3/uL (ref 0.0–0.5)
Eosinophils Relative: 1 %
HCT: 36.7 % (ref 36.0–46.0)
Hemoglobin: 12.1 g/dL (ref 12.0–15.0)
Lymphocytes Relative: 17 %
Lymphs Abs: 1.1 10*3/uL (ref 0.7–4.0)
MCH: 29.5 pg (ref 26.0–34.0)
MCHC: 33 g/dL (ref 30.0–36.0)
MCV: 89.5 fL (ref 80.0–100.0)
Monocytes Absolute: 0.1 10*3/uL (ref 0.1–1.0)
Monocytes Relative: 1 %
Neutro Abs: 5.1 10*3/uL (ref 1.7–17.7)
Neutrophils Relative %: 79 %
Platelets: 269 10*3/uL (ref 150–400)
RBC: 4.1 MIL/uL (ref 3.87–5.11)
RDW: 12.3 % (ref 11.5–15.5)
WBC: 6.3 10*3/uL (ref 4.0–10.5)
nRBC: 0 % (ref 0.0–0.2)

## 2018-07-22 LAB — COMPREHENSIVE METABOLIC PANEL
ALT: 21 U/L (ref 0–44)
AST: 14 U/L — ABNORMAL LOW (ref 15–41)
Albumin: 3.7 g/dL (ref 3.5–5.0)
Alkaline Phosphatase: 160 U/L — ABNORMAL HIGH (ref 38–126)
Anion gap: 10 (ref 5–15)
BUN: 14 mg/dL (ref 6–20)
CO2: 28 mmol/L (ref 22–32)
Calcium: 8.8 mg/dL — ABNORMAL LOW (ref 8.9–10.3)
Chloride: 99 mmol/L (ref 98–111)
Creatinine, Ser: 1.02 mg/dL — ABNORMAL HIGH (ref 0.44–1.00)
GFR calc Af Amer: >60 mL/min (ref >60)
GFR calc non Af Amer: >60 mL/min (ref >60)
Glucose, Bld: 199 mg/dL — ABNORMAL HIGH (ref 70–99)
Potassium: 4.2 mmol/L (ref 3.5–5.1)
Sodium: 137 mmol/L (ref 135–145)
Total Bilirubin: 0.4 mg/dL (ref 0.3–1.2)
Total Protein: 7.7 g/dL (ref 6.5–8.1)

## 2018-07-23 ENCOUNTER — Ambulatory Visit: Payer: Federal, State, Local not specified - PPO | Attending: General Surgery | Admitting: Physical Therapy

## 2018-07-23 ENCOUNTER — Encounter: Payer: Self-pay | Admitting: Physical Therapy

## 2018-07-23 ENCOUNTER — Inpatient Hospital Stay: Payer: Federal, State, Local not specified - PPO | Admitting: Oncology

## 2018-07-23 ENCOUNTER — Inpatient Hospital Stay: Payer: Federal, State, Local not specified - PPO

## 2018-07-23 DIAGNOSIS — R293 Abnormal posture: Secondary | ICD-10-CM

## 2018-07-23 DIAGNOSIS — C50212 Malignant neoplasm of upper-inner quadrant of left female breast: Secondary | ICD-10-CM | POA: Insufficient documentation

## 2018-07-23 DIAGNOSIS — Z483 Aftercare following surgery for neoplasm: Secondary | ICD-10-CM | POA: Diagnosis not present

## 2018-07-23 DIAGNOSIS — M25612 Stiffness of left shoulder, not elsewhere classified: Secondary | ICD-10-CM | POA: Diagnosis not present

## 2018-07-23 DIAGNOSIS — Z170 Estrogen receptor positive status [ER+]: Secondary | ICD-10-CM | POA: Insufficient documentation

## 2018-07-23 NOTE — Therapy (Signed)
[place], [place], [address] Phone: [phone]   Fax:  [phone]  Physical Therapy Treatment  Patient Details  Name: Cassandra Sanders MRN: [id number] Date of Birth: 1965-05-27 Referring Provider (PT): Dr. Rolm Bookbinder   Encounter Date: 07/23/2018  PT End of Session - 07/23/18 1154    Visit Number  2    Number of Visits  2    PT Start Time  1100    PT Stop Time  1153    PT Time Calculation (min)  53 min    Activity Tolerance  Patient tolerated treatment well    Behavior During Therapy  [facility] for tasks assessed/performed       Past Medical History:  Diagnosis Date  . Breast cancer ([place]) 06/04/2018   left breast  . Diabetes mellitus without complication ([place])   . Hypertension     Past Surgical History:  Procedure Laterality Date  . BREAST LUMPECTOMY WITH RADIOACTIVE SEED AND SENTINEL LYMPH NODE BIOPSY Left 06/26/2018   Procedure: LEFT BREAST LUMPECTOMY WITH RADIOACTIVE SEED AND LEFT AXILLARY SENTINEL LYMPH NODE BIOPSY;  Surgeon: Rolm Bookbinder, MD;  Location: [facility];  Service: General;  Laterality: Left;  . PORTACATH PLACEMENT Right 06/26/2018   Procedure: INSERTION PORT-A-CATH WITH ULTRASOUND;  Surgeon: Rolm Bookbinder, MD;  Location: [facility];  Service: General;  Laterality: Right;    There were no vitals filed for this visit.  Subjective Assessment - 07/23/18 1109    Subjective  Patient reports she is here to be assessed following her left lumpectomy and sentinel node biopsy on 06/26/2018. She had 3 negative axillary nodes removed. She had an Oncotype score of 56 so she began chemotherapy on 07/17/2018. She will undergo radiation after chemotherapy. She reports no difficulties with her arm.    Pertinent History  Patient was diagnosed on 05/15/2018 with left grade III invasive ductal carcinoma breast cancer. It measures 9 mm and is located in the upper  inner quadrant. It is triple negative with a Ki67 of 80%. She underwent a left lumpectomy and SLNB (0/3 nodes positive) on 06/26/2018. Other medical problems include diabetes, hypertension, and morbid obesity.    Patient Stated Goals  Make sure my arm is doing ok    Currently in Pain?  Yes    Pain Score  4     Pain Location  Breast    Pain Orientation  Left;Lateral    Pain Descriptors / Indicators  Pins and needles    Pain Type  Surgical pain    Pain Onset  More than a month ago    Pain Frequency  Intermittent    Aggravating Factors   Laying on left side    Pain Relieving Factors  Changing positions    Multiple Pain Sites  No         [facility] PT Assessment - 07/23/18 0001      Assessment   Medical Diagnosis  s/p left lumpectomy and SLNB    Referring Provider (PT)  Dr. Rolm Bookbinder    Onset Date/Surgical Date  06/16/18    Hand Dominance  Left    Prior Therapy  Baseline assessment      Precautions   Precautions  Other (comment)    Precaution Comments  Recent breast surgery; left arm lymphedema risk      Restrictions   Weight Bearing Restrictions  No      Balance Screen   Has the patient fallen in  the past 6 months  No    Has the patient had a decrease in activity level because of a fear of falling?   No    Is the patient reluctant to leave their home because of a fear of falling?   No      Home Environment   Living Environment  Private residence    Living Arrangements  Children   54 y.o. 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       Posture/Postural Control   Posture/Postural Control  Postural limitations    Postural Limitations  Rounded Shoulders;Forward head      ROM / Strength   AROM / PROM / Strength  AROM      AROM   AROM Assessment Site  Shoulder    Right/Left Shoulder  Left    Left Shoulder Extension  43 Degrees    Left Shoulder Flexion  136 Degrees    Left Shoulder ABduction  137 Degrees    Left Shoulder Internal Rotation  63 Degrees    Left Shoulder External Rotation  80 Degrees      Strength   Overall Strength  Within functional limits for tasks performed        LYMPHEDEMA/ONCOLOGY QUESTIONNAIRE - 07/23/18 1129      Type   Cancer Type  Left breast      Surgeries   Lumpectomy Date  06/26/18    Sentinel Lymph Node Biopsy Date  06/26/18    Number Lymph Nodes Removed  3      Treatment   Active Chemotherapy Treatment  Yes    Date  07/17/18    Past Chemotherapy Treatment  No    Active Radiation Treatment  No    Past Radiation Treatment  No    Current Hormone Treatment  No    Past Hormone Therapy  No      What other symptoms do you have   Are you Having Heaviness or Tightness  Yes    Are you having Pain  Yes    Are you having pitting edema  No    Is it Hard or Difficult finding clothes that fit  No    Do you have infections  No    Is there Decreased scar mobility  Yes    [place]mer Sign  No      Right Upper Extremity Lymphedema   10 cm Proximal to Olecranon Process  42.3 cm    Olecranon Process  33.3 cm    10 cm Proximal to Ulnar Styloid Process  28.8 cm    Just Proximal to Ulnar Styloid Process  20.5 cm    Across Hand at [employer]  20.7 cm    At [place] of 2nd Digit  7.5 cm      Left Upper Extremity Lymphedema   10 cm Proximal to Olecranon Process  42.8 cm    Olecranon Process  33.6 cm    10 cm  Proximal to Ulnar Styloid Process  28.3 cm    Just Proximal to Ulnar Styloid Process  21.7 cm    Across Hand at [employer]  21.2 cm    At [place] of 2nd Digit  7.5 cm        Quick  Dash - 07/23/18 0001    Open a tight or new jar  Mild difficulty    Do heavy household chores (wash walls, wash floors)  No difficulty    Carry a shopping bag or briefcase  No difficulty    Wash your back  No difficulty    Use a knife to cut food  No difficulty    Recreational activities in which you take some force or impact through your arm, shoulder, or hand (golf, hammering, tennis)  No difficulty    During the past week, to what extent has your arm, shoulder or hand problem interfered with your normal social activities with family, friends, neighbors, or groups?  Not at all    During the past week, to what extent has your arm, shoulder or hand problem limited your work or other regular daily activities  Slightly    Arm, shoulder, or hand pain.  None    Tingling (pins and needles) in your arm, shoulder, or hand  None    Difficulty Sleeping  No difficulty    DASH Score  4.55 %                     PT Education - 07/23/18 1148    Education Details  Shoulder strengthening and flexion with overpressure; instructed her with gentle scar mobilization for axillary and breast incision using her coconut oil. Reviewed and issued ABC class packet for lymphedema education.    Person(s) Educated  Patient    Methods  Explanation;Demonstration;Handout    Comprehension  Returned demonstration;Verbalized understanding          PT Long Term Goals - 07/23/18 1157      PT LONG TERM GOAL #1   Title  Patient will demonstrate she has returned to baseline following surgery related to shoulder ROM and function.    Time  8    Period  Weeks    Status  Partially Met   Not fully met due to slight flexion ROM limitation           Plan - 07/23/18 1154    Clinical Impression Statement  Patient is doing very well s/p lumpectomy and SLNB. She has restored shoulder ROM except flexion which is close to baseline. Flexion with overpressure exercise issued today and should assist with achieving  full ROM. Incisions surrounded by slightly thick scar tissue but she was instructed with scar mobilization for that. She was educated on lymphedema risk reduction practices and issued the ABC class packet. She has no other PT needs at this time.    PT Treatment/Interventions  ADLs/Self Care Home Management;Patient/family education;Therapeutic exercise    PT Next Visit Plan  D/C    PT Home Exercise Plan  Post op shoulder ROM HEP and strengthening    Consulted and Agree with Plan of Care  Patient       Patient will benefit from skilled therapeutic intervention in order to improve the following deficits and impairments:  Decreased range of motion, Impaired UE functional use, Pain, Decreased knowledge of precautions, Postural dysfunction, Decreased scar mobility  Visit Diagnosis: 1. Malignant neoplasm of upper-inner quadrant of left breast in female, estrogen receptor positive (  [place])   2. Abnormal posture   3. Stiffness of left shoulder, not elsewhere classified   4. Aftercare following surgery for neoplasm        Problem List Patient Active Problem List   Diagnosis Date Noted  . Port-A-Cath in place 07/17/2018  . Morbid obesity with BMI of 40.0-44.9, adult ([place]) 06/10/2018  . Malignant neoplasm of upper-inner quadrant of left breast in female, estrogen receptor positive ([place]) 06/08/2018  . Right shoulder pain 06/21/2016  . Strain of calf muscle, initial encounter 05/30/2016   PHYSICAL THERAPY DISCHARGE SUMMARY  Visits from Start of Care: 2  Current functional level related to goals / functional outcomes: See objective findings above.   Remaining deficits: Slight shoulder flexion ROM limitation and scar thickness.   Education / Equipment: HEP and lymphedema risk reduction information  Plan: Patient agrees to discharge.  Patient goals were partially met. Patient is being discharged due to being pleased with the current functional level.  ?????         Cassandra Sanders,  [place] 07/23/18 11:59 AM   [place], [place], [address] Phone: [phone]   Fax:  [phone]  Name: Bertine Schlottman MRN: [id number] Date of Birth: 05/08/1965

## 2018-07-23 NOTE — Patient Instructions (Addendum)
Closed Chain: Shoulder Flexion / Extension - on Wall    Hands on wall, step backward. Return. Stepping causes shoulder flexion and extension Do _3_ times, holding 10 seconds, each foot, _2__ times per day.  http://ss.exer.us/265   Copyright  VHI. All rights reserved.  SHOULDER: Flexion Unilateral (Weight)    Start with arm at side. Raise arm forward and up. Keep elbow straight. Hold _1-2__ seconds. Use _2__ lb weight. _10__ reps per set,  __7_ days per week Use _2__ lb dumbbell.  Copyright  VHI. All rights reserved.  SHOULDER: Abduction (Weight)    Raise arm out and up. Keep elbow straight. Do not shrug shoulders. Hold __1-2_ seconds. Use _2__ lb weight. __10_ reps per set,  __7_ days per week. Use _2__ lb dumbbell.  Copyright  VHI. All rights reserved.    With arms at sides, thumbs forward, lift both arms forward to chest level. Repeat ____ times per session. Do ____ sessions per week. Hand Variation: Palms forward  Position: Standing    Copyright  VHI. All rights reserved.

## 2018-07-31 ENCOUNTER — Telehealth: Payer: Self-pay | Admitting: *Deleted

## 2018-07-31 ENCOUNTER — Other Ambulatory Visit: Payer: Self-pay

## 2018-07-31 ENCOUNTER — Inpatient Hospital Stay: Payer: Federal, State, Local not specified - PPO

## 2018-07-31 ENCOUNTER — Inpatient Hospital Stay: Payer: Federal, State, Local not specified - PPO | Admitting: Adult Health

## 2018-07-31 ENCOUNTER — Encounter: Payer: Self-pay | Admitting: Adult Health

## 2018-07-31 VITALS — BP 163/93 | HR 90 | Temp 98.7°F | Resp 18 | Ht 69.0 in | Wt 284.2 lb

## 2018-07-31 VITALS — BP 144/80 | HR 80

## 2018-07-31 DIAGNOSIS — Z7984 Long term (current) use of oral hypoglycemic drugs: Secondary | ICD-10-CM | POA: Diagnosis not present

## 2018-07-31 DIAGNOSIS — C50212 Malignant neoplasm of upper-inner quadrant of left female breast: Secondary | ICD-10-CM

## 2018-07-31 DIAGNOSIS — Z79899 Other long term (current) drug therapy: Secondary | ICD-10-CM

## 2018-07-31 DIAGNOSIS — Z170 Estrogen receptor positive status [ER+]: Secondary | ICD-10-CM

## 2018-07-31 DIAGNOSIS — Z7982 Long term (current) use of aspirin: Secondary | ICD-10-CM | POA: Diagnosis not present

## 2018-07-31 DIAGNOSIS — Z5111 Encounter for antineoplastic chemotherapy: Secondary | ICD-10-CM | POA: Diagnosis not present

## 2018-07-31 DIAGNOSIS — Z6841 Body Mass Index (BMI) 40.0 and over, adult: Secondary | ICD-10-CM

## 2018-07-31 DIAGNOSIS — I10 Essential (primary) hypertension: Secondary | ICD-10-CM | POA: Diagnosis not present

## 2018-07-31 DIAGNOSIS — Z95828 Presence of other vascular implants and grafts: Secondary | ICD-10-CM

## 2018-07-31 DIAGNOSIS — E119 Type 2 diabetes mellitus without complications: Secondary | ICD-10-CM | POA: Diagnosis not present

## 2018-07-31 DIAGNOSIS — Z5189 Encounter for other specified aftercare: Secondary | ICD-10-CM | POA: Diagnosis not present

## 2018-07-31 LAB — CBC WITH DIFFERENTIAL/PLATELET
Abs Immature Granulocytes: 0.35 10*3/uL — ABNORMAL HIGH (ref 0.00–0.07)
Basophils Absolute: 0.1 10*3/uL (ref 0.0–0.1)
Basophils Relative: 1 %
Eosinophils Absolute: 0 10*3/uL (ref 0.0–0.5)
Eosinophils Relative: 0 %
HCT: 33.7 % — ABNORMAL LOW (ref 36.0–46.0)
Hemoglobin: 11 g/dL — ABNORMAL LOW (ref 12.0–15.0)
Immature Granulocytes: 6 %
Lymphocytes Relative: 25 %
Lymphs Abs: 1.5 10*3/uL (ref 0.7–4.0)
MCH: 29.1 pg (ref 26.0–34.0)
MCHC: 32.6 g/dL (ref 30.0–36.0)
MCV: 89.2 fL (ref 80.0–100.0)
Monocytes Absolute: 0.6 10*3/uL (ref 0.1–1.0)
Monocytes Relative: 11 %
Neutro Abs: 3.3 10*3/uL (ref 1.7–7.7)
Neutrophils Relative %: 57 %
Platelets: 277 10*3/uL (ref 150–400)
RBC: 3.78 MIL/uL — ABNORMAL LOW (ref 3.87–5.11)
RDW: 12.3 % (ref 11.5–15.5)
WBC: 5.9 10*3/uL (ref 4.0–10.5)
nRBC: 0.7 % — ABNORMAL HIGH (ref 0.0–0.2)

## 2018-07-31 LAB — COMPREHENSIVE METABOLIC PANEL
ALT: 58 U/L — ABNORMAL HIGH (ref 0–44)
AST: 27 U/L (ref 15–41)
Albumin: 3.6 g/dL (ref 3.5–5.0)
Alkaline Phosphatase: 128 U/L — ABNORMAL HIGH (ref 38–126)
Anion gap: 12 (ref 5–15)
BUN: 8 mg/dL (ref 6–20)
CO2: 23 mmol/L (ref 22–32)
Calcium: 9.4 mg/dL (ref 8.9–10.3)
Chloride: 103 mmol/L (ref 98–111)
Creatinine, Ser: 1.02 mg/dL — ABNORMAL HIGH (ref 0.44–1.00)
GFR calc Af Amer: >60 mL/min (ref >60)
GFR calc non Af Amer: >60 mL/min (ref >60)
Glucose, Bld: 283 mg/dL — ABNORMAL HIGH (ref 70–99)
Potassium: 3.9 mmol/L (ref 3.5–5.1)
Sodium: 138 mmol/L (ref 135–145)
Total Bilirubin: <0.2 mg/dL — ABNORMAL LOW (ref 0.3–1.2)
Total Protein: 7.3 g/dL (ref 6.5–8.1)

## 2018-07-31 MED ORDER — FAMOTIDINE IN NACL 20-0.9 MG/50ML-% IV SOLN
INTRAVENOUS | Status: AC
  Filled 2018-07-31: qty 50

## 2018-07-31 MED ORDER — FAMOTIDINE IN NACL 20-0.9 MG/50ML-% IV SOLN
20.0000 mg | Freq: Once | INTRAVENOUS | Status: AC
  Administered 2018-07-31: 20 mg via INTRAVENOUS

## 2018-07-31 MED ORDER — PEGFILGRASTIM 6 MG/0.6ML ~~LOC~~ PSKT
PREFILLED_SYRINGE | SUBCUTANEOUS | Status: AC
  Filled 2018-07-31: qty 0.6

## 2018-07-31 MED ORDER — SODIUM CHLORIDE 0.9 % IV SOLN
600.0000 mg/m2 | Freq: Once | INTRAVENOUS | Status: AC
  Administered 2018-07-31: 1520 mg via INTRAVENOUS
  Filled 2018-07-31: qty 76

## 2018-07-31 MED ORDER — DOXORUBICIN HCL CHEMO IV INJECTION 2 MG/ML
60.0000 mg/m2 | Freq: Once | INTRAVENOUS | Status: AC
  Administered 2018-07-31: 152 mg via INTRAVENOUS
  Filled 2018-07-31: qty 76

## 2018-07-31 MED ORDER — HEPARIN SOD (PORK) LOCK FLUSH 100 UNIT/ML IV SOLN
500.0000 [IU] | Freq: Once | INTRAVENOUS | Status: AC | PRN
  Administered 2018-07-31: 500 [IU]
  Filled 2018-07-31: qty 5

## 2018-07-31 MED ORDER — PALONOSETRON HCL INJECTION 0.25 MG/5ML
INTRAVENOUS | Status: AC
  Filled 2018-07-31: qty 5

## 2018-07-31 MED ORDER — PEGFILGRASTIM 6 MG/0.6ML ~~LOC~~ PSKT
6.0000 mg | PREFILLED_SYRINGE | Freq: Once | SUBCUTANEOUS | Status: AC
  Administered 2018-07-31: 6 mg via SUBCUTANEOUS

## 2018-07-31 MED ORDER — SODIUM CHLORIDE 0.9% FLUSH
10.0000 mL | Freq: Once | INTRAVENOUS | Status: AC
  Administered 2018-07-31: 10 mL
  Filled 2018-07-31: qty 10

## 2018-07-31 MED ORDER — OMEPRAZOLE 40 MG PO CPDR: 40.0000 mg | DELAYED_RELEASE_CAPSULE | Freq: Every day | ORAL | 0 refills | Status: DC

## 2018-07-31 MED ORDER — SODIUM CHLORIDE 0.9% FLUSH
10.0000 mL | INTRAVENOUS | Status: DC | PRN
  Administered 2018-07-31: 10 mL
  Filled 2018-07-31: qty 10

## 2018-07-31 MED ORDER — PALONOSETRON HCL INJECTION 0.25 MG/5ML
0.2500 mg | Freq: Once | INTRAVENOUS | Status: AC
  Administered 2018-07-31: 0.25 mg via INTRAVENOUS

## 2018-07-31 MED ORDER — SODIUM CHLORIDE 0.9 % IV SOLN
Freq: Once | INTRAVENOUS | Status: AC
  Administered 2018-07-31: 11:00:00 via INTRAVENOUS
  Filled 2018-07-31: qty 250

## 2018-07-31 MED ORDER — SODIUM CHLORIDE 0.9 % IV SOLN
Freq: Once | INTRAVENOUS | Status: AC
  Administered 2018-07-31: 11:00:00 via INTRAVENOUS
  Filled 2018-07-31: qty 5

## 2018-07-31 NOTE — Telephone Encounter (Signed)
Per noted BP of 163/93 - reviewed with [facility]/NP with ok to proceed with treatment. Requested to recheck BP in 30 minutes per manual cuff as well as to report if pt has hypertension symptoms.  Above called to treatment room nurse.

## 2018-07-31 NOTE — Patient Instructions (Signed)
[facility] Discharge Instructions for Patients Receiving Chemotherapy  Today you received the following chemotherapy agents:  Doxorubicin (Adriamycin), Cyclophosphamide (Cytoxan)  To help prevent nausea and vomiting after your treatment, we encourage you to take your nausea medication as prescribed.   If you develop nausea and vomiting that is not controlled by your nausea medication, call the clinic.   BELOW ARE SYMPTOMS THAT SHOULD BE REPORTED IMMEDIATELY:  *FEVER GREATER THAN 100.5 F  *CHILLS WITH OR WITHOUT FEVER  NAUSEA AND VOMITING THAT IS NOT CONTROLLED WITH YOUR NAUSEA MEDICATION  *UNUSUAL SHORTNESS OF BREATH  *UNUSUAL BRUISING OR BLEEDING  TENDERNESS IN MOUTH AND THROAT WITH OR WITHOUT PRESENCE OF ULCERS  *URINARY PROBLEMS  *BOWEL PROBLEMS  UNUSUAL RASH Items with * indicate a potential emergency and should be followed up as soon as possible.  Feel free to call the clinic should you have any questions or concerns. The clinic phone number is [phone].  Please show the [place] at check-in to the Emergency Department and triage nurse.

## 2018-07-31 NOTE — Patient Instructions (Signed)

## 2018-07-31 NOTE — Progress Notes (Signed)
Gave RN new instuctions for antinausea meds that are for Adriamycin/Cytoxan per Dr Lindi Adie.  RN will go over for pt.

## 2018-07-31 NOTE — Progress Notes (Signed)
[place]  [phone] [phone]     ID: Cassandra Sanders DOB: 1965/03/31  MR#: [id number]  NUU#:[id number]  Patient Care Team: Lennie Odor, PA-C as PCP - General (Nurse Practitioner) Rockwell Germany, RN as Oncology Nurse Navigator Mauro Kaufmann, RN as Oncology Nurse Navigator Magrinat, Virgie Dad, MD as Consulting Physician (Oncology) Rolm Bookbinder, MD as Consulting Physician (General Surgery) Kyung Rudd, MD as Consulting Physician (Radiation Oncology) Marylynn Pearson, MD as Consulting Physician (Obstetrics and Gynecology) Scot Dock, NP OTHER MD:   CHIEF COMPLAINT: Triple negative breast cancer  CURRENT TREATMENT: Adjuvant chemotherapy   INTERVAL HISTORY: Cassandra Sanders returns today for follow-up and treatment of her triple negative breast cancer.    She began on adjuvant chemotherapy on 07/17/2018 with dose dense doxorubicin and cyclophosphamide every 14 days x4. Today is day 1 cycle 2.    Her most recent echo on 07/16/2018 showed an EF of 60-65%.   REVIEW OF SYSTEMS: Cassandra Sanders notes that she is doing well today.  She has noted some stinging over her left axillary scar line.  It is healing and she is applying Vitamin E oil to help prevent from getting a keloid.  She wants to know if there is anything to get the sting out.   Cassandra Sanders also notes a globus sensation in her throat over the past week.  She denies any sore throat, or dysphagia, just feeling like there is a lump there.    Cassandra Sanders is otherwise doing well.  She has no fever or chills.  She is without chest pain or palpitations.  She has no cough, shortness of breath.  She is without nausea, vomiting, bowel/bladder concerns.  A detailed ROS was otherwise non contributory.     HISTORY OF CURRENT ILLNESS: From the original intake note:  Cassandra Sanders had routine screening mammography on 05/15/2018 at [facility] showing a possible abnormality in the left breast. She  underwent bilateral diagnostic mammography with tomography and left breast ultrasonography at [facility] on 06/02/2018 showing: breast density category B; suspicious approximate 0.9 cm mass involving the upper inner quadrant of the left breast at posterior depth which account for the screening mammographic finding; no pathologic left axillary lymphadenopathy.  Accordingly on 06/04/2018 she proceeded to biopsy of the left breast area in question. The pathology from this procedure ([id number]) showed: invasive ductal carcinoma, grade 3. Prognostic indicators significant for: estrogen receptor, 60% positive with moderate staining intensity and progesterone receptor, 0% negative. Proliferation marker Ki67 at 80%. HER2 negative by immunohistochemistry (0).  The patient's subsequent history is as detailed below.   PAST MEDICAL HISTORY: Past Medical History:  Diagnosis Date  . Breast cancer ([place]) 06/04/2018   left breast  . Diabetes mellitus without complication ([place])   . Hypertension     PAST SURGICAL HISTORY: Past Surgical History:  Procedure Laterality Date  . BREAST LUMPECTOMY WITH RADIOACTIVE SEED AND SENTINEL LYMPH NODE BIOPSY Left 06/26/2018   Procedure: LEFT BREAST LUMPECTOMY WITH RADIOACTIVE SEED AND LEFT AXILLARY SENTINEL LYMPH NODE BIOPSY;  Surgeon: Rolm Bookbinder, MD;  Location: [facility];  Service: General;  Laterality: Left;  . PORTACATH PLACEMENT Right 06/26/2018   Procedure: INSERTION PORT-A-CATH WITH ULTRASOUND;  Surgeon: Rolm Bookbinder, MD;  Location: [facility];  Service: General;  Laterality: Right;    FAMILY HISTORY: History reviewed. No pertinent family history. Patient's father was in his 69s when he was murdered. Patient's mother is currently (as of 06/2018) at age  75. The patient denies a family hx of breast or ovarian cancer. She has 3 brothers and no biological sisters.   GYNECOLOGIC HISTORY:  Patient's last menstrual period  was 06/18/2013. Menarche: 53 years old Age at first live birth: 53 years old [place] P 1 LMP June 2015 Contraceptive used from age [age] with no complications HRT none  Hysterectomy? no BSO? no   SOCIAL HISTORY: (updated 09/13/6732)  Cassandra Sanders is currently working as a Magazine features editor at the [employer]. She is engaged to [facility]. She lives at home with her son [name], age 63, who works here in [place] as a Museum/gallery exhibitions officer.  The patient attends [employer].    ADVANCED DIRECTIVES: not in place; at the 06/10/2018 visit the patient was given the appropriate documents to complete and notarize at her discretion.  She intends to name her son is her healthcare power of attorney   HEALTH MAINTENANCE: Social History   Tobacco Use  . Smoking status: Never Smoker  . Smokeless tobacco: Never Used  Substance Use Topics  . Alcohol use: No  . Drug use: No     Colonoscopy: 2016  PAP: 05/15/2018, negative  Bone density: never done   No Known Allergies  Current Outpatient Medications  Medication Sig Dispense Refill  . aspirin EC 81 MG tablet Take 81 mg by mouth daily.    Marland Kitchen dexamethasone (DECADRON) 4 MG tablet Take 2 tablets by mouth once a day on the day after chemotherapy and then take 2 tablets two times a day for 2 days. Take with food. 30 tablet 1  . JANUVIA 100 MG tablet daily.     Marland Kitchen lidocaine-prilocaine (EMLA) cream Apply to affected area once 30 g 3  . lisinopril (PRINIVIL,ZESTRIL) 10 MG tablet Take 10 mg by mouth daily.  2  . loratadine (CLARITIN) 10 MG tablet Take 1 tablet (10 mg total) by mouth daily. 90 tablet 1  . LORazepam (ATIVAN) 0.5 MG tablet Take 1 tablet (0.5 mg total) by mouth at bedtime as needed and may repeat dose one time if needed (Nausea or vomiting). 30 tablet 0  . METFORMIN HCL PO Take 1,000 mg by mouth daily.     Marland Kitchen oxyCODONE (OXY IR/ROXICODONE) 5 MG immediate release tablet Take 1 tablet (5 mg total) by mouth every 6 (six) hours as  needed for moderate pain, severe pain or breakthrough pain. 12 tablet 0  . prochlorperazine (COMPAZINE) 10 MG tablet Take 1 tablet (10 mg total) by mouth every 6 (six) hours as needed (Nausea or vomiting). 30 tablet 1  . omeprazole (PRILOSEC) 40 MG capsule Take 1 capsule (40 mg total) by mouth daily. 30 capsule 0   No current facility-administered medications for this visit.    Facility-Administered Medications Ordered in Other Visits  Medication Dose Route Frequency Provider Last Rate Last Dose  . cyclophosphamide (CYTOXAN) 1,520 mg in sodium chloride 0.9 % 250 mL chemo infusion  600 mg/m2 (Treatment Plan Recorded) Intravenous Once Magrinat, Virgie Dad, MD 652 mL/hr at 07/31/18 1257 1,520 mg at 07/31/18 1257  . heparin lock flush 100 unit/mL  500 Units Intracatheter Once PRN Magrinat, Virgie Dad, MD      . pegfilgrastim (NEULASTA ONPRO KIT) injection 6 mg  6 mg Subcutaneous Once Magrinat, Virgie Dad, MD      . sodium chloride flush (NS) 0.9 % injection 10 mL  10 mL Intracatheter PRN Magrinat, Virgie Dad, MD        OBJECTIVE:  Vitals:  07/31/18 0917  BP: (!) 163/93  Pulse: 90  Resp: 18  Temp: 98.7 F (37.1 C)  SpO2: 100%   Body mass index is 41.97 kg/m.  Wt Readings from Last 3 Encounters:  07/31/18 284 lb 3.2 oz (128.9 kg)  07/22/18 286 lb 11.2 oz (130 kg)  07/17/18 288 lb 4.8 oz (130.8 kg)  ECOG FS:1 - Symptomatic but completely ambulatory GENERAL: Patient is a well appearing female in no acute distress HEENT:  Sclerae anicteric.  Oropharynx clear and moist. No ulcerations or evidence of oropharyngeal candidiasis. Neck is supple.  NODES:  No cervical, supraclavicular, or axillary lymphadenopathy palpated. Left axilla with scar noted, healing, but with area of pink healing scar line noted.  BREAST EXAM:  Deferred. LUNGS:  Clear to auscultation bilaterally.  No wheezes or rhonchi. HEART:  Regular rate and rhythm. No murmur appreciated. ABDOMEN:  Soft, nontender.  Positive, normoactive  bowel sounds. No organomegaly palpated. MSK:  No focal spinal tenderness to palpation. Full range of motion bilaterally in the upper extremities. EXTREMITIES:  No peripheral edema.   SKIN:  Clear with no obvious rashes or skin changes. No nail dyscrasia. NEURO:  Nonfocal. Well oriented.  Appropriate affect.   LAB RESULTS:  CMP     Component Value Date/Time   NA 138 07/31/2018 0815   K 3.9 07/31/2018 0815   CL 103 07/31/2018 0815   CO2 23 07/31/2018 0815   GLUCOSE 283 (H) 07/31/2018 0815   BUN 8 07/31/2018 0815   CREATININE 1.02 (H) 07/31/2018 0815   CREATININE 1.14 (H) 06/10/2018 0859   CALCIUM 9.4 07/31/2018 0815   PROT 7.3 07/31/2018 0815   ALBUMIN 3.6 07/31/2018 0815   AST 27 07/31/2018 0815   AST 13 (L) 06/10/2018 0859   ALT 58 (H) 07/31/2018 0815   ALT 17 06/10/2018 0859   ALKPHOS 128 (H) 07/31/2018 0815   BILITOT <0.2 (L) 07/31/2018 0815   BILITOT 0.3 06/10/2018 0859   GFRNONAA >60 07/31/2018 0815   GFRNONAA 54 (L) 06/10/2018 0859   GFRAA >60 07/31/2018 0815   GFRAA >60 06/10/2018 0859    No results found for: TOTALPROTELP, ALBUMINELP, A1GS, A2GS, BETS, BETA2SER, GAMS, MSPIKE, SPEI  No results found for: KPAFRELGTCHN, LAMBDASER, KAPLAMBRATIO  Lab Results  Component Value Date   WBC 5.9 07/31/2018   NEUTROABS 3.3 07/31/2018   HGB 11.0 (L) 07/31/2018   HCT 33.7 (L) 07/31/2018   MCV 89.2 07/31/2018   PLT 277 07/31/2018    @LASTCHEMISTRY @  No results found for: LABCA2  No components found for: WERXVQ008  No results for input(s): INR in the last 168 hours.  No results found for: LABCA2  No results found for: QPY195  No results found for: KDT267  No results found for: TIW580  No results found for: CA2729  No components found for: HGQUANT  No results found for: CEA1 / No results found for: CEA1   No results found for: AFPTUMOR  No results found for: CHROMOGRNA  No results found for: PSA1  Appointment on 07/31/2018  Component Date Value Ref  Range Status  . Sodium 07/31/2018 138  135 - 145 mmol/L Final  . Potassium 07/31/2018 3.9  3.5 - 5.1 mmol/L Final  . Chloride 07/31/2018 103  98 - 111 mmol/L Final  . CO2 07/31/2018 23  22 - 32 mmol/L Final  . Glucose, Bld 07/31/2018 283* 70 - 99 mg/dL Final  . BUN 07/31/2018 8  6 - 20 mg/dL Final  . Creatinine, Ser 07/31/2018 1.02*  0.44 - 1.00 mg/dL Final  . Calcium 07/31/2018 9.4  8.9 - 10.3 mg/dL Final  . Total Protein 07/31/2018 7.3  6.5 - 8.1 g/dL Final  . Albumin 07/31/2018 3.6  3.5 - 5.0 g/dL Final  . AST 07/31/2018 27  15 - 41 U/L Final  . ALT 07/31/2018 58* 0 - 44 U/L Final  . Alkaline Phosphatase 07/31/2018 128* 38 - 126 U/L Final  . Total Bilirubin 07/31/2018 <0.2* 0.3 - 1.2 mg/dL Final  . GFR calc non Af Amer 07/31/2018 >60  >60 mL/min Final  . GFR calc Af Amer 07/31/2018 >60  >60 mL/min Final  . Anion gap 07/31/2018 12  5 - 15 Final   Performed at [facility], [place] [address] [place], [place] [address]  . WBC 07/31/2018 5.9  4.0 - 10.5 K/uL Final  . RBC 07/31/2018 3.78* 3.87 - 5.11 MIL/uL Final  . Hemoglobin 07/31/2018 11.0* 12.0 - 15.0 g/dL Final  . HCT 07/31/2018 33.7* 36.0 - 46.0 % Final  . MCV 07/31/2018 89.2  80.0 - 100.0 fL Final  . MCH 07/31/2018 29.1  26.0 - 34.0 pg Final  . MCHC 07/31/2018 32.6  30.0 - 36.0 g/dL Final  . RDW 07/31/2018 12.3  11.5 - 15.5 % Final  . Platelets 07/31/2018 277  150 - 400 K/uL Final  . nRBC 07/31/2018 0.7* 0.0 - 0.2 % Final  . Neutrophils Relative % 07/31/2018 57  % Final  . Neutro Abs 07/31/2018 3.3  1.7 - 7.7 K/uL Final  . Lymphocytes Relative 07/31/2018 25  % Final  . Lymphs Abs 07/31/2018 1.5  0.7 - 4.0 K/uL Final  . Monocytes Relative 07/31/2018 11  % Final  . Monocytes Absolute 07/31/2018 0.6  0.1 - 1.0 K/uL Final  . Eosinophils Relative 07/31/2018 0  % Final  . Eosinophils Absolute 07/31/2018 0.0  0.0 - 0.5 K/uL Final  . Basophils Relative 07/31/2018 1  % Final  . Basophils Absolute 07/31/2018 0.1   0.0 - 0.1 K/uL Final  . Immature Granulocytes 07/31/2018 6  % Final   Increased IG's, likely caused by Bone Marrow Colony Stimulating Factor received within 30 days.  . Abs Immature Granulocytes 07/31/2018 0.35* 0.00 - 0.07 K/uL Final   Performed at [facility], [place] [address], [place], [place] [address]    (this displays the last labs from the last 3 days)  No results found for: TOTALPROTELP, ALBUMINELP, A1GS, A2GS, BETS, BETA2SER, GAMS, MSPIKE, SPEI (this displays SPEP labs)  No results found for: KPAFRELGTCHN, LAMBDASER, KAPLAMBRATIO (kappa/lambda light chains)  No results found for: HGBA, HGBA2QUANT, HGBFQUANT, HGBSQUAN (Hemoglobinopathy evaluation)   No results found for: LDH  No results found for: IRON, TIBC, IRONPCTSAT (Iron and TIBC)  No results found for: FERRITIN  Urinalysis No results found for: COLORURINE, APPEARANCEUR, LABSPEC, PHURINE, GLUCOSEU, HGBUR, BILIRUBINUR, KETONESUR, PROTEINUR, UROBILINOGEN, NITRITE, LEUKOCYTESUR   STUDIES: No results found.  ELIGIBLE FOR AVAILABLE RESEARCH PROTOCOL: no  ASSESSMENT: 53 y.o. [place] woman status post left breast upper inner quadrant biopsy 06/04/2018 for a clinical T1b N0, stage IB invasive ductal carcinoma, grade 3, estrogen receptor moderately positive, progesterone receptor negative, with an MIB-1 of 80%, and HER-2 nonamplified  (1) left lumpectomy and sentinel lymph node sampling 06/26/2018 showed a pT1c pN0, stage IB invasive ductal carcinoma, grade 3, with negative margins  (a) repeat prognostic panel triple negative  (b) a total of 3 sentinel lymph nodes were removed  (2) Oncotype score of 56 obtained from  the 06/04/2018 biopsy predicts a risk of outside the breast recurrence over the next 9 years of nearly 40% if the patient's only systemic therapy is antiestrogens for 5 years.  It also predicts a significant benefit from chemotherapy.  (a) Oncotype reads the tumor also as triple  negative.  (3) adjuvant chemo therapy will consist of cyclophosphamide and doxorubicin in dose dense fashion x4 starting 07/17/2018, followed by weekly paclitaxel and carboplatin x12  (a) echo on 07/16/2018 shows EF of 60-65%  (4) adjuvant radiation to follow chemotherapy  (5) consider X4585 study depending on residual disease after neoadjuvant treatment   PLAN: Ayvah is doing well today.  Her labs are stable, and she will proceed with her second cycle of chemotherapy today with Doxorubicin and Cyclophosphamide today.  She will receive Neulasta with Onpro support.  I prescribed omeprazole for her to take due to the globus sensation in her throat.  This is likely due to reflux from the dexamethasone.  Alizae was recommended to apply neosporin if needed to the pink area of her scar to help with the tenderness as it will sometimes take the stinging out.  She can continue with vitamin e oil as well.  Dejon will return in 1 week for labs and f/u.  She was recommended to continue with the appropriate pandemic precautions. She knows to call for any questions or concerns between now and then.  A total of (30) minutes of face-to-face time was spent with this patient with greater than 50% of that time in counseling and care-coordination.  Wilber Bihari, NP  07/31/18 1:00 PM Medical Oncology and Hematology [facility] [address] [place], [place] [address] Tel. [phone]    Fax. [phone]

## 2018-08-04 ENCOUNTER — Telehealth: Payer: Self-pay | Admitting: *Deleted

## 2018-08-04 NOTE — Telephone Encounter (Signed)
This RN spoke with pt this AM per VM - with multiple concerns regarding other issues with medical care- as well as Covid issues.  She needs to have eyes examined ( she states due to diabetes she gets checked yearly ) - which includes dilation. She states her eye doctor has safety measures in place for Covid.  She inquired about " eating in a restaurant"- this RN advised her best to not sit inside a restaurant at this time- if she eats at a restaurant best to eat at outside seating with ample social distance environment and fresh air. Cassandra Sanders states her fiance was asking so she wanted to ask [place] - but she ideally does not want to eat out at a restaurant during [place] ).  Cassandra Sanders then stated she has " need for urgent dental care due to a tooth on the bottom that is causing a lot of pain with chewing and not allowing my teeth to align well "  She states she will likely need dental extraction as well as be " put asleep " for procedure.  This RN discussed dental issue- including goal is to coordinate appropriately during chemo to allow for good labs and healing for best outcome.  This RN encouraged pt to talk with [facility]/NP per upcoming visit this Friday regarding dental issue for best plan.  No other issues at this time.

## 2018-08-07 ENCOUNTER — Other Ambulatory Visit: Payer: Self-pay

## 2018-08-07 ENCOUNTER — Encounter: Payer: Self-pay | Admitting: Adult Health

## 2018-08-07 ENCOUNTER — Ambulatory Visit: Payer: Federal, State, Local not specified - PPO

## 2018-08-07 ENCOUNTER — Inpatient Hospital Stay: Payer: Federal, State, Local not specified - PPO

## 2018-08-07 ENCOUNTER — Ambulatory Visit: Payer: Federal, State, Local not specified - PPO | Admitting: Adult Health

## 2018-08-07 ENCOUNTER — Other Ambulatory Visit: Payer: Self-pay | Admitting: Physician Assistant

## 2018-08-07 ENCOUNTER — Other Ambulatory Visit: Payer: Federal, State, Local not specified - PPO

## 2018-08-07 ENCOUNTER — Inpatient Hospital Stay (HOSPITAL_BASED_OUTPATIENT_CLINIC_OR_DEPARTMENT_OTHER): Payer: Federal, State, Local not specified - PPO | Admitting: Adult Health

## 2018-08-07 VITALS — BP 137/96 | HR 97 | Temp 97.0°F | Resp 18 | Ht 69.0 in | Wt 280.5 lb

## 2018-08-07 DIAGNOSIS — Z7984 Long term (current) use of oral hypoglycemic drugs: Secondary | ICD-10-CM | POA: Diagnosis not present

## 2018-08-07 DIAGNOSIS — C50212 Malignant neoplasm of upper-inner quadrant of left female breast: Secondary | ICD-10-CM

## 2018-08-07 DIAGNOSIS — Z79899 Other long term (current) drug therapy: Secondary | ICD-10-CM | POA: Diagnosis not present

## 2018-08-07 DIAGNOSIS — Z7982 Long term (current) use of aspirin: Secondary | ICD-10-CM

## 2018-08-07 DIAGNOSIS — Z98818 Other dental procedure status: Secondary | ICD-10-CM

## 2018-08-07 DIAGNOSIS — Z5111 Encounter for antineoplastic chemotherapy: Secondary | ICD-10-CM | POA: Diagnosis not present

## 2018-08-07 DIAGNOSIS — I10 Essential (primary) hypertension: Secondary | ICD-10-CM

## 2018-08-07 DIAGNOSIS — Z170 Estrogen receptor positive status [ER+]: Secondary | ICD-10-CM | POA: Diagnosis not present

## 2018-08-07 DIAGNOSIS — E119 Type 2 diabetes mellitus without complications: Secondary | ICD-10-CM

## 2018-08-07 DIAGNOSIS — Z5189 Encounter for other specified aftercare: Secondary | ICD-10-CM | POA: Diagnosis not present

## 2018-08-07 LAB — COMPREHENSIVE METABOLIC PANEL
ALT: 29 U/L (ref 0–44)
AST: 12 U/L — ABNORMAL LOW (ref 15–41)
Albumin: 3.6 g/dL (ref 3.5–5.0)
Alkaline Phosphatase: 106 U/L (ref 38–126)
Anion gap: 8 (ref 5–15)
BUN: 12 mg/dL (ref 6–20)
CO2: 27 mmol/L (ref 22–32)
Calcium: 9 mg/dL (ref 8.9–10.3)
Chloride: 102 mmol/L (ref 98–111)
Creatinine, Ser: 0.93 mg/dL (ref 0.44–1.00)
GFR calc Af Amer: >60 mL/min (ref >60)
GFR calc non Af Amer: >60 mL/min (ref >60)
Glucose, Bld: 218 mg/dL — ABNORMAL HIGH (ref 70–99)
Potassium: 4.1 mmol/L (ref 3.5–5.1)
Sodium: 137 mmol/L (ref 135–145)
Total Bilirubin: 0.5 mg/dL (ref 0.3–1.2)
Total Protein: 7 g/dL (ref 6.5–8.1)

## 2018-08-07 LAB — CBC WITH DIFFERENTIAL/PLATELET
Abs Immature Granulocytes: 0.02 10*3/uL (ref 0.00–0.07)
Basophils Absolute: 0 10*3/uL (ref 0.0–0.1)
Basophils Relative: 3 %
Eosinophils Absolute: 0 10*3/uL (ref 0.0–0.5)
Eosinophils Relative: 0 %
HCT: 32.7 % — ABNORMAL LOW (ref 36.0–46.0)
Hemoglobin: 10.9 g/dL — ABNORMAL LOW (ref 12.0–15.0)
Immature Granulocytes: 1 %
Lymphocytes Relative: 42 %
Lymphs Abs: 0.6 10*3/uL — ABNORMAL LOW (ref 0.7–4.0)
MCH: 29.3 pg (ref 26.0–34.0)
MCHC: 33.3 g/dL (ref 30.0–36.0)
MCV: 87.9 fL (ref 80.0–100.0)
Monocytes Absolute: 0.1 10*3/uL (ref 0.1–1.0)
Monocytes Relative: 4 %
Neutro Abs: 0.7 10*3/uL — ABNORMAL LOW (ref 1.7–7.7)
Neutrophils Relative %: 50 %
Platelets: 253 10*3/uL (ref 150–400)
RBC: 3.72 MIL/uL — ABNORMAL LOW (ref 3.87–5.11)
RDW: 12.2 % (ref 11.5–15.5)
WBC: 1.4 10*3/uL — ABNORMAL LOW (ref 4.0–10.5)
nRBC: 0 % (ref 0.0–0.2)

## 2018-08-07 MED ORDER — AMOXICILLIN 500 MG PO TABS: ORAL_TABLET | ORAL | 0 refills | Status: DC

## 2018-08-07 NOTE — Progress Notes (Signed)
[facility]  [phone] [phone]     ID: [name] DOB: September 03, 1965  MR#: [id number]  KGY#:[id number]  Patient Care Team: Lennie Odor, PA-C as PCP - General (Nurse Practitioner) Rockwell Germany, RN as Oncology Nurse Navigator Mauro Kaufmann, RN as Oncology Nurse Navigator Magrinat, Virgie Dad, MD as Consulting Physician (Oncology) Rolm Bookbinder, MD as Consulting Physician (General Surgery) Kyung Rudd, MD as Consulting Physician (Radiation Oncology) Marylynn Pearson, MD as Consulting Physician (Obstetrics and Gynecology) Scot Dock, NP OTHER MD:   CHIEF COMPLAINT: Triple negative breast cancer  CURRENT TREATMENT: Adjuvant chemotherapy   INTERVAL HISTORY: Cassandra Sanders returns today for follow-up and treatment of her triple negative breast cancer.   She began on adjuvant chemotherapy on 07/17/2018 with dose dense doxorubicin and cyclophosphamide every 14 days x4. Today a 1 week follow up following Day 1, Cycle 2.   Her most recent echo on 07/16/2018 showed an EF of 60-65%.   REVIEW OF SYSTEMS: Cassandra Sanders notes that she is doing well today. She notes some dental pain related to a tooth on her lower jaw which requires an extraction. She is planning to have this procedure performed on 08/15/2018.   Otherwise Cassandra Sanders is doing well. She denies any fevers or chills. She denies chest pain or palpations. She denies cough or shortness of breath. She denies nausea, vomiting, diarrhea, or constipation. A detailed ROS was otherwise noncontributory.   HISTORY OF CURRENT ILLNESS: From the original intake note:  Cassandra Sanders had routine screening mammography on 05/15/2018 at [facility] showing a possible abnormality in the left breast. She underwent bilateral diagnostic mammography with tomography and left breast ultrasonography at [facility] on 06/02/2018 showing: breast density category B; suspicious approximate 0.9 cm mass involving  the upper inner quadrant of the left breast at posterior depth which account for the screening mammographic finding; no pathologic left axillary lymphadenopathy.  Accordingly on 06/04/2018 she proceeded to biopsy of the left breast area in question. The pathology from this procedure ([id number]) showed: invasive ductal carcinoma, grade 3. Prognostic indicators significant for: estrogen receptor, 60% positive with moderate staining intensity and progesterone receptor, 0% negative. Proliferation marker Ki67 at 80%. HER2 negative by immunohistochemistry (0).  The patient's subsequent history is as detailed below.   PAST MEDICAL HISTORY: Past Medical History:  Diagnosis Date   Breast cancer ([place]) 06/04/2018   left breast   Diabetes mellitus without complication ([place])    Hypertension     PAST SURGICAL HISTORY: Past Surgical History:  Procedure Laterality Date   BREAST LUMPECTOMY WITH RADIOACTIVE SEED AND SENTINEL LYMPH NODE BIOPSY Left 06/26/2018   Procedure: LEFT BREAST LUMPECTOMY WITH RADIOACTIVE SEED AND LEFT AXILLARY SENTINEL LYMPH NODE BIOPSY;  Surgeon: Rolm Bookbinder, MD;  Location: [facility];  Service: General;  Laterality: Left;   PORTACATH PLACEMENT Right 06/26/2018   Procedure: INSERTION PORT-A-CATH WITH ULTRASOUND;  Surgeon: Rolm Bookbinder, MD;  Location: [facility];  Service: General;  Laterality: Right;    FAMILY HISTORY: History reviewed. No pertinent family history. Patient's father was in his 5s when he was murdered. Patient's mother is currently (as of 06/2018) at age 25. The patient denies a family hx of breast or ovarian cancer. She has 3 brothers and no biological sisters.   GYNECOLOGIC HISTORY:  Patient's last menstrual period was 06/18/2013. Menarche: 53 years old Age at first live birth: 53 years old [place] P 1 LMP June 2015 Contraceptive used from age [age] with  no complications HRT none  Hysterectomy? no BSO?  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 She is engaged to [facility]. She lives at home with her son Cassandra Sanders, age 25, who works here in [place] as a Museum/gallery exhibitions officer.  The patient attends [employer].    ADVANCED DIRECTIVES: not in place; at the 06/10/2018 visit the patient was given the appropriate documents to complete and notarize at her discretion.  She intends to name her son is her healthcare power of attorney   HEALTH MAINTENANCE: Social History   Tobacco Use   Smoking status: Never Smoker   Smokeless tobacco: Never Used  Substance Use Topics   Alcohol use: No   Drug use: No     Colonoscopy: 2016  PAP: 05/15/2018, negative  Bone density: never done   No Known Allergies  Current Outpatient Medications  Medication Sig Dispense Refill   aspirin EC 81 MG tablet Take 81 mg by mouth daily.     dexamethasone (DECADRON) 4 MG tablet Take 2 tablets by mouth once a day on the day after chemotherapy and then take 2 tablets two times a day for 2 days. Take with food. 30 tablet 1   JANUVIA 100 MG tablet daily.      lidocaine-prilocaine (EMLA) cream Apply to affected area once 30 g 3   lisinopril (PRINIVIL,ZESTRIL) 10 MG tablet Take 10 mg by mouth daily.  2   loratadine (CLARITIN) 10 MG tablet Take 1 tablet (10 mg total) by mouth daily. 90 tablet 1   LORazepam (ATIVAN) 0.5 MG tablet Take 1 tablet (0.5 mg total) by mouth at bedtime as needed and may repeat dose one time if needed (Nausea or vomiting). 30 tablet 0   METFORMIN HCL PO Take 1,000 mg by mouth daily.      omeprazole (PRILOSEC) 40 MG capsule Take 1 capsule (40 mg total) by mouth daily. 30 capsule 0   oxyCODONE (OXY IR/ROXICODONE) 5 MG immediate release tablet Take 1 tablet (5 mg total) by mouth every 6 (six) hours as needed for moderate pain, severe pain or breakthrough pain. 12 tablet 0   prochlorperazine  (COMPAZINE) 10 MG tablet Take 1 tablet (10 mg total) by mouth every 6 (six) hours as needed (Nausea or vomiting). 30 tablet 1   No current facility-administered medications for this visit.     OBJECTIVE:  Vitals:   08/07/18 0937  BP: (!) 137/96  Pulse: 97  Resp: 18  Temp: (!) 97 F (36.1 C)  SpO2: 100%   Body mass index is 41.42 kg/m.  Wt Readings from Last 3 Encounters:  08/07/18 280 lb 8 oz (127.2 kg)  07/31/18 284 lb 3.2 oz (128.9 kg)  07/22/18 286 lb 11.2 oz (130 kg)  ECOG FS:1 - Symptomatic but completely ambulatory  Wt Readings from Last 3 Encounters:  07/31/18 284 lb 3.2 oz (128.9 kg)  07/22/18 286 lb 11.2 oz (130 kg)  07/17/18 288 lb 4.8 oz (130.8 kg)  ECOG FS:1 - Symptomatic but completely ambulatory GENERAL: Patient is a well appearing female in no acute distress HEENT:  Sclerae anicteric.  Oropharynx clear and moist. No ulcerations or evidence of oropharyngeal candidiasis. Neck is supple.  NODES:  No cervical, supraclavicular, or axillary lymphadenopathy palpated. Left axilla with scar noted, healing, but with area of pink healing scar line noted.  BREAST EXAM:  Deferred. LUNGS:  Clear to auscultation bilaterally.  No  wheezes or rhonchi. HEART:  Regular rate and rhythm. No murmur appreciated. ABDOMEN:  Soft, nontender.  Positive, normoactive bowel sounds. No organomegaly palpated. MSK:  No focal spinal tenderness to palpation. Full range of motion bilaterally in the upper extremities. EXTREMITIES:  No peripheral edema.   SKIN:  Clear with no obvious rashes or skin changes. No nail dyscrasia. NEURO:  Nonfocal. Well oriented.  Appropriate affect.   LAB RESULTS:  CMP     Component Value Date/Time   NA 138 07/31/2018 0815   K 3.9 07/31/2018 0815   CL 103 07/31/2018 0815   CO2 23 07/31/2018 0815   GLUCOSE 283 (H) 07/31/2018 0815   BUN 8 07/31/2018 0815   CREATININE 1.02 (H) 07/31/2018 0815   CREATININE 1.14 (H) 06/10/2018 0859   CALCIUM 9.4 07/31/2018 0815    PROT 7.3 07/31/2018 0815   ALBUMIN 3.6 07/31/2018 0815   AST 27 07/31/2018 0815   AST 13 (L) 06/10/2018 0859   ALT 58 (H) 07/31/2018 0815   ALT 17 06/10/2018 0859   ALKPHOS 128 (H) 07/31/2018 0815   BILITOT <0.2 (L) 07/31/2018 0815   BILITOT 0.3 06/10/2018 0859   GFRNONAA >60 07/31/2018 0815   GFRNONAA 54 (L) 06/10/2018 0859   GFRAA >60 07/31/2018 0815   GFRAA >60 06/10/2018 0859    No results found for: TOTALPROTELP, ALBUMINELP, A1GS, A2GS, BETS, BETA2SER, GAMS, MSPIKE, SPEI  No results found for: KPAFRELGTCHN, LAMBDASER, KAPLAMBRATIO  Lab Results  Component Value Date   WBC 1.4 (L) 08/07/2018   NEUTROABS 0.7 (L) 08/07/2018   HGB 10.9 (L) 08/07/2018   HCT 32.7 (L) 08/07/2018   MCV 87.9 08/07/2018   PLT 253 08/07/2018    @LASTCHEMISTRY @  No results found for: LABCA2  No components found for: XBDZHG992  No results for input(s): INR in the last 168 hours.  No results found for: LABCA2  No results found for: EQA834  No results found for: HDQ222  No results found for: LNL892  No results found for: CA2729  No components found for: HGQUANT  No results found for: CEA1 / No results found for: CEA1   No results found for: AFPTUMOR  No results found for: CHROMOGRNA  No results found for: PSA1  Appointment on 08/07/2018  Component Date Value Ref Range Status   WBC 08/07/2018 1.4* 4.0 - 10.5 K/uL Final   RBC 08/07/2018 3.72* 3.87 - 5.11 MIL/uL Final   Hemoglobin 08/07/2018 10.9* 12.0 - 15.0 g/dL Final   HCT 08/07/2018 32.7* 36.0 - 46.0 % Final   MCV 08/07/2018 87.9  80.0 - 100.0 fL Final   MCH 08/07/2018 29.3  26.0 - 34.0 pg Final   MCHC 08/07/2018 33.3  30.0 - 36.0 g/dL Final   RDW 08/07/2018 12.2  11.5 - 15.5 % Final   Platelets 08/07/2018 253  150 - 400 K/uL Final   nRBC 08/07/2018 0.0  0.0 - 0.2 % Final   Neutrophils Relative % 08/07/2018 50  % Final   Neutro Abs 08/07/2018 0.7* 1.7 - 7.7 K/uL Final   Lymphocytes Relative 08/07/2018 42  %  Final   Lymphs Abs 08/07/2018 0.6* 0.7 - 4.0 K/uL Final   Monocytes Relative 08/07/2018 4  % Final   Monocytes Absolute 08/07/2018 0.1  0.1 - 1.0 K/uL Final   Eosinophils Relative 08/07/2018 0  % Final   Eosinophils Absolute 08/07/2018 0.0  0.0 - 0.5 K/uL Final   Basophils Relative 08/07/2018 3  % Final   Basophils Absolute 08/07/2018 0.0  0.0 -  0.1 K/uL Final   Immature Granulocytes 08/07/2018 1  % Final   Abs Immature Granulocytes 08/07/2018 0.02  0.00 - 0.07 K/uL Final   Performed at [facility], [place] [address], [place], [place] [address]    (this displays the last labs from the last 3 days)  No results found for: TOTALPROTELP, ALBUMINELP, A1GS, A2GS, BETS, BETA2SER, GAMS, MSPIKE, SPEI (this displays SPEP labs)  No results found for: KPAFRELGTCHN, LAMBDASER, KAPLAMBRATIO (kappa/lambda light chains)  No results found for: HGBA, HGBA2QUANT, HGBFQUANT, HGBSQUAN (Hemoglobinopathy evaluation)   No results found for: LDH  No results found for: IRON, TIBC, IRONPCTSAT (Iron and TIBC)  No results found for: FERRITIN  Urinalysis No results found for: COLORURINE, APPEARANCEUR, LABSPEC, PHURINE, GLUCOSEU, HGBUR, BILIRUBINUR, KETONESUR, PROTEINUR, UROBILINOGEN, NITRITE, LEUKOCYTESUR   STUDIES: No results found.  ELIGIBLE FOR AVAILABLE RESEARCH PROTOCOL: no  ASSESSMENT: 53 y.o. [place] woman status post left breast upper inner quadrant biopsy 06/04/2018 for a clinical T1b N0, stage IB invasive ductal carcinoma, grade 3, estrogen receptor moderately positive, progesterone receptor negative, with an MIB-1 of 80%, and HER-2 nonamplified  (1) left lumpectomy and sentinel lymph node sampling 06/26/2018 showed a pT1c pN0, stage IB invasive ductal carcinoma, grade 3, with negative margins  (a) repeat prognostic panel triple negative  (b) a total of 3 sentinel lymph nodes were removed  (2) Oncotype score of 56 obtained from the 06/04/2018 biopsy  predicts a risk of outside the breast recurrence over the next 9 years of nearly 40% if the patient's only systemic therapy is antiestrogens for 5 years.  It also predicts a significant benefit from chemotherapy.  (a) Oncotype reads the tumor also as triple negative.  (3) adjuvant chemo therapy will consist of cyclophosphamide and doxorubicin in dose dense fashion x4 starting 07/17/2018, followed by weekly paclitaxel and carboplatin x12  (a) echo on 07/16/2018 shows EF of 60-65%  (4) adjuvant radiation to follow chemotherapy  PLAN: Irmalee is doing well today. Her labs today show neutropenia with an ANC of 0.7. She received Neulasta with Onpro support with her last cycle of chemotherapy. Her WBC count is expected to increase in the next few days.  Given that the patient is undergoing a minor dental procedure next week, Dr. Jana Hakim recommends that the patient receive a one time dose of prophylactic amoxicillin 500 mg prior to her procedure. I have sent this prescription to the patient's pharmacy.   Cassandra Sanders will return in 1 week for labs, return visit, and infusion.  She was recommended to continue with the appropriate pandemic precautions. She will be returning to work next week but will be working remotely. She knows to call for any questions or concerns between now and then.  A total of 20 minutes of face-to-face time was spent with this patient with greater than 50% of that time in counseling and care-coordination.  Cassandra Heilingoetter PA-C Medical Oncology and Hematology [facility] [address] [place], [place] [address] Tel. [phone]    Fax. [phone]

## 2018-08-14 ENCOUNTER — Inpatient Hospital Stay: Payer: Federal, State, Local not specified - PPO

## 2018-08-14 ENCOUNTER — Inpatient Hospital Stay: Payer: Federal, State, Local not specified - PPO | Attending: Oncology

## 2018-08-14 ENCOUNTER — Other Ambulatory Visit: Payer: Self-pay

## 2018-08-14 ENCOUNTER — Encounter: Payer: Self-pay | Admitting: Adult Health

## 2018-08-14 ENCOUNTER — Inpatient Hospital Stay: Payer: Federal, State, Local not specified - PPO | Admitting: Adult Health

## 2018-08-14 VITALS — BP 143/99 | HR 114 | Temp 98.3°F | Resp 18 | Ht 69.0 in | Wt 277.1 lb

## 2018-08-14 DIAGNOSIS — Z7689 Persons encountering health services in other specified circumstances: Secondary | ICD-10-CM | POA: Diagnosis not present

## 2018-08-14 DIAGNOSIS — Z170 Estrogen receptor positive status [ER+]: Secondary | ICD-10-CM | POA: Diagnosis not present

## 2018-08-14 DIAGNOSIS — C50919 Malignant neoplasm of unspecified site of unspecified female breast: Secondary | ICD-10-CM | POA: Diagnosis not present

## 2018-08-14 DIAGNOSIS — Z171 Estrogen receptor negative status [ER-]: Secondary | ICD-10-CM | POA: Diagnosis not present

## 2018-08-14 DIAGNOSIS — C50212 Malignant neoplasm of upper-inner quadrant of left female breast: Secondary | ICD-10-CM

## 2018-08-14 DIAGNOSIS — Z5111 Encounter for antineoplastic chemotherapy: Secondary | ICD-10-CM | POA: Insufficient documentation

## 2018-08-14 DIAGNOSIS — Z79899 Other long term (current) drug therapy: Secondary | ICD-10-CM | POA: Insufficient documentation

## 2018-08-14 DIAGNOSIS — Z95828 Presence of other vascular implants and grafts: Secondary | ICD-10-CM

## 2018-08-14 DIAGNOSIS — I10 Essential (primary) hypertension: Secondary | ICD-10-CM | POA: Diagnosis not present

## 2018-08-14 DIAGNOSIS — E1169 Type 2 diabetes mellitus with other specified complication: Secondary | ICD-10-CM | POA: Diagnosis not present

## 2018-08-14 DIAGNOSIS — Z6841 Body Mass Index (BMI) 40.0 and over, adult: Secondary | ICD-10-CM

## 2018-08-14 LAB — COMPREHENSIVE METABOLIC PANEL
ALT: 23 U/L (ref 0–44)
AST: 15 U/L (ref 15–41)
Albumin: 3.5 g/dL (ref 3.5–5.0)
Alkaline Phosphatase: 100 U/L (ref 38–126)
Anion gap: 15 (ref 5–15)
BUN: 10 mg/dL (ref 6–20)
CO2: 24 mmol/L (ref 22–32)
Calcium: 10 mg/dL (ref 8.9–10.3)
Chloride: 104 mmol/L (ref 98–111)
Creatinine, Ser: 1.37 mg/dL — ABNORMAL HIGH (ref 0.44–1.00)
GFR calc Af Amer: 51 mL/min — ABNORMAL LOW (ref >60)
GFR calc non Af Amer: 44 mL/min — ABNORMAL LOW (ref >60)
Glucose, Bld: 231 mg/dL — ABNORMAL HIGH (ref 70–99)
Potassium: 3.6 mmol/L (ref 3.5–5.1)
Sodium: 143 mmol/L (ref 135–145)
Total Bilirubin: 0.2 mg/dL — ABNORMAL LOW (ref 0.3–1.2)
Total Protein: 7.2 g/dL (ref 6.5–8.1)

## 2018-08-14 LAB — CBC WITH DIFFERENTIAL/PLATELET
Abs Immature Granulocytes: 0.05 10*3/uL (ref 0.00–0.07)
Basophils Absolute: 0 10*3/uL (ref 0.0–0.1)
Basophils Relative: 2 %
Eosinophils Absolute: 0 10*3/uL (ref 0.0–0.5)
Eosinophils Relative: 0 %
HCT: 30.6 % — ABNORMAL LOW (ref 36.0–46.0)
Hemoglobin: 10.1 g/dL — ABNORMAL LOW (ref 12.0–15.0)
Immature Granulocytes: 4 %
Lymphocytes Relative: 30 %
Lymphs Abs: 0.4 10*3/uL — ABNORMAL LOW (ref 0.7–4.0)
MCH: 29 pg (ref 26.0–34.0)
MCHC: 33 g/dL (ref 30.0–36.0)
MCV: 87.9 fL (ref 80.0–100.0)
Monocytes Absolute: 0.4 10*3/uL (ref 0.1–1.0)
Monocytes Relative: 27 %
Neutro Abs: 0.5 10*3/uL — ABNORMAL LOW (ref 1.7–7.7)
Neutrophils Relative %: 37 %
Platelets: 277 10*3/uL (ref 150–400)
RBC: 3.48 MIL/uL — ABNORMAL LOW (ref 3.87–5.11)
RDW: 12.6 % (ref 11.5–15.5)
WBC: 1.3 10*3/uL — ABNORMAL LOW (ref 4.0–10.5)
nRBC: 0 % (ref 0.0–0.2)

## 2018-08-14 MED ORDER — TBO-FILGRASTIM 480 MCG/0.8ML ~~LOC~~ SOSY
PREFILLED_SYRINGE | SUBCUTANEOUS | Status: AC
  Filled 2018-08-14: qty 0.8

## 2018-08-14 MED ORDER — TBO-FILGRASTIM 480 MCG/0.8ML ~~LOC~~ SOSY
480.0000 ug | PREFILLED_SYRINGE | Freq: Once | SUBCUTANEOUS | Status: AC
  Administered 2018-08-14: 480 ug via SUBCUTANEOUS

## 2018-08-14 MED ORDER — SODIUM CHLORIDE 0.9% FLUSH
10.0000 mL | Freq: Once | INTRAVENOUS | Status: AC
  Administered 2018-08-14: 10 mL
  Filled 2018-08-14: qty 10

## 2018-08-14 NOTE — Patient Instructions (Signed)
Tbo-Filgrastim injection What is this medicine? TBO-FILGRASTIM (T B O fil GRA stim) is a granulocyte colony-stimulating factor that helps you make more neutrophils, a type of white blood cell. Neutrophils are important for fighting infections. Some chemotherapy affects your bone marrow and lowers your neutrophils. This medicine helps decrease the length of time that neutrophils are very low (severe neutropenia). This medicine may be used for other purposes; ask your health care provider or pharmacist if you have questions. COMMON BRAND NAME(S): Granix What should I tell my health care provider before I take this medicine? They need to know if you have any of these conditions:  bone scan or tests planned  kidney disease  sickle cell anemia  an unusual or allergic reaction to tbo-filgrastim, filgrastim, pegfilgrastim, other medicines, foods, dyes, or preservatives  pregnant or trying to get pregnant  breast-feeding How should I use this medicine? This medicine is for injection under the skin. If you get this medicine at home, you will be taught how to prepare and give this medicine. Refer to the Instructions for Use that come with your medication packaging. Use exactly as directed. Take your medicine at regular intervals. Do not take your medicine more often than directed. It is important that you put your used needles and syringes in a special sharps container. Do not put them in a trash can. If you do not have a sharps container, call your pharmacist or healthcare provider to get one. Talk to your pediatrician regarding the use of this medicine in children. While this drug may be prescribed for children as young as 1 month of age for selected conditions, precautions do apply. Overdosage: If you think you have taken too much of this medicine contact a poison control center or emergency room at once. NOTE: This medicine is only for you. Do not share this medicine with others. What if I miss a  dose? It is important not to miss your dose. Call your doctor or health care professional if you miss a dose. What may interact with this medicine? This medicine may interact with the following medications:  medicines that may cause a release of neutrophils, such as lithium This list may not describe all possible interactions. Give your health care provider a list of all the medicines, herbs, non-prescription drugs, or dietary supplements you use. Also tell them if you smoke, drink alcohol, or use illegal drugs. Some items may interact with your medicine. What should I watch for while using this medicine? You may need blood work done while you are taking this medicine. What side effects may I notice from receiving this medicine? Side effects that you should report to your doctor or health care professional as soon as possible:  allergic reactions like skin rash, itching or hives, swelling of the face, lips, or tongue  back pain  blood in the urine  dark urine  dizziness  fast heartbeat  feeling faint  shortness of breath or breathing problems  signs and symptoms of infection like fever or chills; cough; or sore throat  signs and symptoms of kidney injury like trouble passing urine or change in the amount of urine  stomach or side pain, or pain at the shoulder  sweating  swelling of the legs, ankles, or abdomen  tiredness Side effects that usually do not require medical attention (report to your doctor or health care professional if they continue or are bothersome):  bone pain  diarrhea  headache  muscle pain  vomiting This list may   not describe all possible side effects. Call your doctor for medical advice about side effects. You may report side effects to FDA at 1-800-FDA-1088. Where should I keep my medicine? Keep out of the reach of children. Store in a refrigerator between 2 and 8 degrees C (36 and 46 degrees F). Keep in carton to protect from light. Throw  away this medicine if it is left out of the refrigerator for more than 5 consecutive days. Throw away any unused medicine after the expiration date. NOTE: This sheet is a summary. It may not cover all possible information. If you have questions about this medicine, talk to your doctor, pharmacist, or health care provider.  2020 Elsevier/Gold Standard (2017-10-25 19:58:39)  

## 2018-08-14 NOTE — Progress Notes (Signed)
[place]  [phone] [phone]     ID: Cassandra Sanders DOB: 03-06-1965  MR#: [id number]  FBP#:[id number]  Patient Care Team: Lennie Odor, PA-C as PCP - General (Nurse Practitioner) Rockwell Germany, RN as Oncology Nurse Navigator Mauro Kaufmann, RN as Oncology Nurse Navigator Magrinat, Virgie Dad, MD as Consulting Physician (Oncology) Rolm Bookbinder, MD as Consulting Physician (General Surgery) Kyung Rudd, MD as Consulting Physician (Radiation Oncology) Marylynn Pearson, MD as Consulting Physician (Obstetrics and Gynecology) Scot Dock, NP OTHER MD:   CHIEF COMPLAINT: Triple negative breast cancer  CURRENT TREATMENT: Adjuvant chemotherapy   INTERVAL HISTORY: Cassandra Sanders returns today for follow-up and treatment of her triple negative breast cancer.   She began on adjuvant chemotherapy on 07/17/2018 with dose dense doxorubicin and cyclophosphamide every 14 days x4. Today is Day 1, Cycle 3.   Her most recent echo on 07/16/2018 showed an EF of 60-65%.   REVIEW OF SYSTEMS: Cassandra Sanders is doing moderately well today.  She continues to be neutropenic today and her WBC are lower than they were last week.  She thinks that the onpro was placed incorrectly and she said it was in an area where it rubbed on her arm and more posterior toward her axilla, and she didn't feel any pain when she received it.    She is eating and drinking well.  She denies any new issues such as fever, chills, chest pain, palpitations, cough, shortness of breath, bowel/bladder changes, nausea or vomiting.  Cassandra detailed ROS was otherwise non contributory.     HISTORY OF CURRENT ILLNESS: From the original intake note:  Cassandra Sanders had routine screening mammography on 05/15/2018 at [facility] showing Cassandra possible abnormality in the left breast. She underwent bilateral diagnostic mammography with tomography and left breast ultrasonography at [facility] on  06/02/2018 showing: breast density category B; suspicious approximate 0.9 cm mass involving the upper inner quadrant of the left breast at posterior depth which account for the screening mammographic finding; no pathologic left axillary lymphadenopathy.  Accordingly on 06/04/2018 she proceeded to biopsy of the left breast area in question. The pathology from this procedure ([id number]) showed: invasive ductal carcinoma, grade 3. Prognostic indicators significant for: estrogen receptor, 60% positive with moderate staining intensity and progesterone receptor, 0% negative. Proliferation marker Ki67 at 80%. HER2 negative by immunohistochemistry (0).  The patient's subsequent history is as detailed below.   PAST MEDICAL HISTORY: Past Medical History:  Diagnosis Date  . Breast cancer ([place]) 06/04/2018   left breast  . Diabetes mellitus without complication ([place])   . Hypertension     PAST SURGICAL HISTORY: Past Surgical History:  Procedure Laterality Date  . BREAST LUMPECTOMY WITH RADIOACTIVE SEED AND SENTINEL LYMPH NODE BIOPSY Left 06/26/2018   Procedure: LEFT BREAST LUMPECTOMY WITH RADIOACTIVE SEED AND LEFT AXILLARY SENTINEL LYMPH NODE BIOPSY;  Surgeon: Rolm Bookbinder, MD;  Location: [place];  Service: General;  Laterality: Left;  . PORTACATH PLACEMENT Right 06/26/2018   Procedure: INSERTION PORT-Cassandra-CATH WITH ULTRASOUND;  Surgeon: Rolm Bookbinder, MD;  Location: [place];  Service: General;  Laterality: Right;    FAMILY HISTORY: No family history on file. Patient's father was in his 1s when he was murdered. Patient's mother is currently (as of 06/2018) at age 63. The patient denies Cassandra family hx of breast or ovarian cancer. She has 3 brothers and no biological sisters.   GYNECOLOGIC HISTORY:  Patient's last menstrual period was 06/18/2013. Menarche:  53 years old Age at first live birth: 53 years old [place] P 1 LMP June 2015 Contraceptive used from age  [age] with no complications HRT none  Hysterectomy? no BSO? 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 She is engaged to [facility]. She lives at home with her son Cassandra Sanders, age 11, who works here in [place] as Cassandra Museum/gallery exhibitions officer.  The patient attends [employer].    ADVANCED DIRECTIVES: not in place; at the 06/10/2018 visit the patient was given the appropriate documents to complete and notarize at her discretion.  She intends to name her son is her healthcare power of attorney   HEALTH MAINTENANCE: Social History   Tobacco Use  . Smoking status: Never Smoker  . Smokeless tobacco: Never Used  Substance Use Topics  . Alcohol use: No  . Drug use: No     Colonoscopy: 2016  PAP: 05/15/2018, negative  Bone density: never done   No Known Allergies  Current Outpatient Medications  Medication Sig Dispense Refill  . acetaminophen-codeine (TYLENOL #3) 300-30 MG tablet     . amoxicillin (AMOXIL) 500 MG tablet Take 1 tablet before your procedure 1 tablet 0  . aspirin EC 81 MG tablet Take 81 mg by mouth daily.    Marland Kitchen dexamethasone (DECADRON) 4 MG tablet Take 2 tablets by mouth once Cassandra day on the day after chemotherapy and then take 2 tablets two times Cassandra day for 2 days. Take with food. 30 tablet 1  . ibuprofen (ADVIL) 800 MG tablet     . JANUVIA 100 MG tablet daily.     Marland Kitchen lidocaine-prilocaine (EMLA) cream Apply to affected area once 30 g 3  . lisinopril (PRINIVIL,ZESTRIL) 10 MG tablet Take 10 mg by mouth daily.  2  . loratadine (CLARITIN) 10 MG tablet Take 1 tablet (10 mg total) by mouth daily. 90 tablet 1  . LORazepam (ATIVAN) 0.5 MG tablet Take 1 tablet (0.5 mg total) by mouth at bedtime as needed and may repeat dose one time if needed (Nausea or vomiting). 30 tablet 0  . METFORMIN HCL PO Take 1,000 mg by mouth daily.     Marland Kitchen omeprazole (PRILOSEC) 40 MG capsule Take 1 capsule (40 mg total)  by mouth daily. 30 capsule 0  . oxyCODONE (OXY IR/ROXICODONE) 5 MG immediate release tablet Take 1 tablet (5 mg total) by mouth every 6 (six) hours as needed for moderate pain, severe pain or breakthrough pain. 12 tablet 0  . prochlorperazine (COMPAZINE) 10 MG tablet Take 1 tablet (10 mg total) by mouth every 6 (six) hours as needed (Nausea or vomiting). 30 tablet 1   No current facility-administered medications for this visit.     OBJECTIVE:  Vitals:   08/14/18 0857  BP: (!) 143/99  Pulse: (!) 114  Resp: 18  Temp: 98.3 F (36.8 C)  SpO2: 98%   Body mass index is 40.92 kg/m.  Wt Readings from Last 3 Encounters:  08/14/18 277 lb 1.6 oz (125.7 kg)  08/07/18 280 lb 8 oz (127.2 kg)  07/31/18 284 lb 3.2 oz (128.9 kg)  ECOG FS:1 - Symptomatic but completely ambulatory  GENERAL: Patient is Cassandra well appearing female in no acute distress HEENT:  Sclerae anicteric.  Oropharynx clear and moist. No ulcerations or evidence of oropharyngeal candidiasis. Neck is supple.  NODES:  No cervical, supraclavicular, or axillary lymphadenopathy palpated BREAST EXAM:  Deferred. LUNGS:  Clear to auscultation bilaterally.  No wheezes or rhonchi. HEART:  Regular rate and rhythm. No murmur appreciated. ABDOMEN:  Soft, nontender.  Positive, normoactive bowel sounds. No organomegaly palpated. MSK:  No focal spinal tenderness to palpation. Full range of motion bilaterally in the upper extremities. EXTREMITIES:  No peripheral edema.   SKIN:  Clear with no obvious rashes or skin changes. No nail dyscrasia. NEURO:  Nonfocal. Well oriented.  Appropriate affect.   LAB RESULTS:  CMP     Component Value Date/Time   NA 143 08/14/2018 0855   K 3.6 08/14/2018 0855   CL 104 08/14/2018 0855   CO2 24 08/14/2018 0855   GLUCOSE 231 (H) 08/14/2018 0855   BUN 10 08/14/2018 0855   CREATININE 1.37 (H) 08/14/2018 0855   CREATININE 1.14 (H) 06/10/2018 0859   CALCIUM 10.0 08/14/2018 0855   PROT 7.2 08/14/2018 0855    ALBUMIN 3.5 08/14/2018 0855   AST 15 08/14/2018 0855   AST 13 (L) 06/10/2018 0859   ALT 23 08/14/2018 0855   ALT 17 06/10/2018 0859   ALKPHOS 100 08/14/2018 0855   BILITOT 0.2 (L) 08/14/2018 0855   BILITOT 0.3 06/10/2018 0859   GFRNONAA 44 (L) 08/14/2018 0855   GFRNONAA 54 (L) 06/10/2018 0859   GFRAA 51 (L) 08/14/2018 0855   GFRAA >60 06/10/2018 0859    No results found for: TOTALPROTELP, ALBUMINELP, A1GS, A2GS, BETS, BETA2SER, GAMS, MSPIKE, SPEI  No results found for: KPAFRELGTCHN, LAMBDASER, KAPLAMBRATIO  Lab Results  Component Value Date   WBC 1.3 (L) 08/14/2018   NEUTROABS 0.5 (L) 08/14/2018   HGB 10.1 (L) 08/14/2018   HCT 30.6 (L) 08/14/2018   MCV 87.9 08/14/2018   PLT 277 08/14/2018    @LASTCHEMISTRY @  No results found for: LABCA2  No components found for: VELFYB017  No results for input(s): INR in the last 168 hours.  No results found for: LABCA2  No results found for: PZW258  No results found for: NID782  No results found for: UMP536  No results found for: CA2729  No components found for: HGQUANT  No results found for: CEA1 / No results found for: CEA1   No results found for: AFPTUMOR  No results found for: CHROMOGRNA  No results found for: PSA1  Appointment on 08/14/2018  Component Date Value Ref Range Status  . Sodium 08/14/2018 143  135 - 145 mmol/L Final  . Potassium 08/14/2018 3.6  3.5 - 5.1 mmol/L Final  . Chloride 08/14/2018 104  98 - 111 mmol/L Final  . CO2 08/14/2018 24  22 - 32 mmol/L Final  . Glucose, Bld 08/14/2018 231* 70 - 99 mg/dL Final  . BUN 08/14/2018 10  6 - 20 mg/dL Final  . Creatinine, Ser 08/14/2018 1.37* 0.44 - 1.00 mg/dL Final  . Calcium 08/14/2018 10.0  8.9 - 10.3 mg/dL Final  . Total Protein 08/14/2018 7.2  6.5 - 8.1 g/dL Final  . Albumin 08/14/2018 3.5  3.5 - 5.0 g/dL Final  . AST 08/14/2018 15  15 - 41 U/L Final  . ALT 08/14/2018 23  0 - 44 U/L Final  . Alkaline Phosphatase 08/14/2018 100  38 - 126 U/L Final  .  Total Bilirubin 08/14/2018 0.2* 0.3 - 1.2 mg/dL Final  . GFR calc non Af Amer 08/14/2018 44* >60 mL/min Final  . GFR calc Af Amer 08/14/2018 51* >60 mL/min Final  . Anion gap 08/14/2018 15  5 - 15 Final   Performed at [facility], [address] [place], [place] [address]  . WBC 08/14/2018 1.3* 4.0 - 10.5 K/uL Final  . RBC 08/14/2018 3.48* 3.87 - 5.11 MIL/uL Final  . Hemoglobin 08/14/2018 10.1* 12.0 - 15.0 g/dL Final  . HCT 08/14/2018 30.6* 36.0 - 46.0 % Final  . MCV 08/14/2018 87.9  80.0 - 100.0 fL Final  . MCH 08/14/2018 29.0  26.0 - 34.0 pg Final  . MCHC 08/14/2018 33.0  30.0 - 36.0 g/dL Final  . RDW 08/14/2018 12.6  11.5 - 15.5 % Final  . Platelets 08/14/2018 277  150 - 400 K/uL Final  . nRBC 08/14/2018 0.0  0.0 - 0.2 % Final  . Neutrophils Relative % 08/14/2018 37  % Final  . Neutro Abs 08/14/2018 0.5* 1.7 - 7.7 K/uL Final  . Lymphocytes Relative 08/14/2018 30  % Final  . Lymphs Abs 08/14/2018 0.4* 0.7 - 4.0 K/uL Final  . Monocytes Relative 08/14/2018 27  % Final  . Monocytes Absolute 08/14/2018 0.4  0.1 - 1.0 K/uL Final  . Eosinophils Relative 08/14/2018 0  % Final  . Eosinophils Absolute 08/14/2018 0.0  0.0 - 0.5 K/uL Final  . Basophils Relative 08/14/2018 2  % Final  . Basophils Absolute 08/14/2018 0.0  0.0 - 0.1 K/uL Final  . Immature Granulocytes 08/14/2018 4  % Final  . Abs Immature Granulocytes 08/14/2018 0.05  0.00 - 0.07 K/uL Final   Performed at [facility], [place] [address] [place], [place] [address]    (this displays the last labs from the last 3 days)  No results found for: TOTALPROTELP, ALBUMINELP, A1GS, A2GS, BETS, BETA2SER, GAMS, MSPIKE, SPEI (this displays SPEP labs)  No results found for: KPAFRELGTCHN, LAMBDASER, KAPLAMBRATIO (kappa/lambda light chains)  No results found for: HGBA, HGBA2QUANT, HGBFQUANT, HGBSQUAN (Hemoglobinopathy evaluation)   No results found for: LDH  No results found for: IRON,  TIBC, IRONPCTSAT (Iron and TIBC)  No results found for: FERRITIN  Urinalysis No results found for: COLORURINE, APPEARANCEUR, LABSPEC, PHURINE, GLUCOSEU, HGBUR, BILIRUBINUR, KETONESUR, PROTEINUR, UROBILINOGEN, NITRITE, LEUKOCYTESUR   STUDIES: No results found.  ELIGIBLE FOR AVAILABLE RESEARCH PROTOCOL: no  ASSESSMENT: 53 y.o. [place] woman status post left breast upper inner quadrant biopsy 06/04/2018 for Cassandra clinical T1b N0, stage IB invasive ductal carcinoma, grade 3, estrogen receptor moderately positive, progesterone receptor negative, with an MIB-1 of 80%, and HER-2 nonamplified  (1) left lumpectomy and sentinel lymph node sampling 06/26/2018 showed Cassandra pT1c pN0, stage IB invasive ductal carcinoma, grade 3, with negative margins  (Cassandra) repeat prognostic panel triple negative  (b) Cassandra total of 3 sentinel lymph nodes were removed  (2) Oncotype score of 56 obtained from the 06/04/2018 biopsy predicts Cassandra risk of outside the breast recurrence over the next 9 years of nearly 40% if the patient's only systemic therapy is antiestrogens for 5 years.  It also predicts Cassandra significant benefit from chemotherapy.  (Cassandra) Oncotype reads the tumor also as triple negative.  (3) adjuvant chemo therapy will consist of cyclophosphamide and doxorubicin in dose dense fashion x4 starting 07/17/2018, followed by weekly paclitaxel and carboplatin x12  (Cassandra) echo on 07/16/2018 shows EF of 60-65%  (4) adjuvant radiation to follow chemotherapy  PLAN: Cassandra Sanders is doing moderately well today.  Her ANC is slightly lower today than last week.  After discussion with her about the placement and the sounds that the onpro device made, along with the decreased counts today, it appears that the device malfunctioned.  With her ANC of 0.5, she will not  receive treatment today with Doxorubicin and Cyclophosphamide.  She will receive two granix injections of 45mg today and tomorrow.  In the future she would like to receive the neulasta  injections the day after treatment instead of the onpro, so this doesn't happen again.  Cassandra Sanders and I reviewed that she should take claritin daily for the next few days to prevent bone pain from the granix.  We reviewed risks/benefits of the injection, and our goal to bring up her WBC so that she can receive treatment next week.  We also reviewed neutropenic precautions.  ANayeliswill return in 1 week for labs, f/u, and her third cycle of Doxorubicin and Cyclophosphamide.  She was recommended to continue with the appropriate pandemic precautions. She knows to call for any questions that may arise between now and her next appointment.  We are happy to see her sooner if needed.  Cassandra total of (30) minutes of face-to-face time was spent with this patient with greater than 50% of that time in counseling and care-coordination.   LWilber Bihari NP Medical Oncology and Hematology [facility] [address]Tel. [phone]   Fax. [phone]

## 2018-08-15 ENCOUNTER — Inpatient Hospital Stay: Payer: Federal, State, Local not specified - PPO

## 2018-08-15 VITALS — BP 149/83 | HR 105 | Temp 98.0°F | Resp 18

## 2018-08-15 DIAGNOSIS — Z79899 Other long term (current) drug therapy: Secondary | ICD-10-CM | POA: Diagnosis not present

## 2018-08-15 DIAGNOSIS — Z95828 Presence of other vascular implants and grafts: Secondary | ICD-10-CM

## 2018-08-15 DIAGNOSIS — Z171 Estrogen receptor negative status [ER-]: Secondary | ICD-10-CM | POA: Diagnosis not present

## 2018-08-15 DIAGNOSIS — Z170 Estrogen receptor positive status [ER+]: Secondary | ICD-10-CM

## 2018-08-15 DIAGNOSIS — C50212 Malignant neoplasm of upper-inner quadrant of left female breast: Secondary | ICD-10-CM | POA: Diagnosis not present

## 2018-08-15 DIAGNOSIS — Z7689 Persons encountering health services in other specified circumstances: Secondary | ICD-10-CM | POA: Diagnosis not present

## 2018-08-15 DIAGNOSIS — Z5111 Encounter for antineoplastic chemotherapy: Secondary | ICD-10-CM | POA: Diagnosis not present

## 2018-08-15 MED ORDER — TBO-FILGRASTIM 480 MCG/0.8ML ~~LOC~~ SOSY
PREFILLED_SYRINGE | SUBCUTANEOUS | Status: AC
  Filled 2018-08-15: qty 0.8

## 2018-08-15 MED ORDER — TBO-FILGRASTIM 480 MCG/0.8ML ~~LOC~~ SOSY
480.0000 ug | PREFILLED_SYRINGE | Freq: Once | SUBCUTANEOUS | Status: AC
  Administered 2018-08-15: 480 ug via SUBCUTANEOUS

## 2018-08-15 NOTE — Patient Instructions (Signed)
Tbo-Filgrastim injection What is this medicine? TBO-FILGRASTIM (T B O fil GRA stim) is a granulocyte colony-stimulating factor that helps you make more neutrophils, a type of white blood cell. Neutrophils are important for fighting infections. Some chemotherapy affects your bone marrow and lowers your neutrophils. This medicine helps decrease the length of time that neutrophils are very low (severe neutropenia). This medicine may be used for other purposes; ask your health care provider or pharmacist if you have questions. COMMON BRAND NAME(S): Granix What should I tell my health care provider before I take this medicine? They need to know if you have any of these conditions:  bone scan or tests planned  kidney disease  sickle cell anemia  an unusual or allergic reaction to tbo-filgrastim, filgrastim, pegfilgrastim, other medicines, foods, dyes, or preservatives  pregnant or trying to get pregnant  breast-feeding How should I use this medicine? This medicine is for injection under the skin. If you get this medicine at home, you will be taught how to prepare and give this medicine. Refer to the Instructions for Use that come with your medication packaging. Use exactly as directed. Take your medicine at regular intervals. Do not take your medicine more often than directed. It is important that you put your used needles and syringes in a special sharps container. Do not put them in a trash can. If you do not have a sharps container, call your pharmacist or healthcare provider to get one. Talk to your pediatrician regarding the use of this medicine in children. While this drug may be prescribed for children as young as 1 month of age for selected conditions, precautions do apply. Overdosage: If you think you have taken too much of this medicine contact a poison control center or emergency room at once. NOTE: This medicine is only for you. Do not share this medicine with others. What if I miss a  dose? It is important not to miss your dose. Call your doctor or health care professional if you miss a dose. What may interact with this medicine? This medicine may interact with the following medications:  medicines that may cause a release of neutrophils, such as lithium This list may not describe all possible interactions. Give your health care provider a list of all the medicines, herbs, non-prescription drugs, or dietary supplements you use. Also tell them if you smoke, drink alcohol, or use illegal drugs. Some items may interact with your medicine. What should I watch for while using this medicine? You may need blood work done while you are taking this medicine. What side effects may I notice from receiving this medicine? Side effects that you should report to your doctor or health care professional as soon as possible:  allergic reactions like skin rash, itching or hives, swelling of the face, lips, or tongue  back pain  blood in the urine  dark urine  dizziness  fast heartbeat  feeling faint  shortness of breath or breathing problems  signs and symptoms of infection like fever or chills; cough; or sore throat  signs and symptoms of kidney injury like trouble passing urine or change in the amount of urine  stomach or side pain, or pain at the shoulder  sweating  swelling of the legs, ankles, or abdomen  tiredness Side effects that usually do not require medical attention (report to your doctor or health care professional if they continue or are bothersome):  bone pain  diarrhea  headache  muscle pain  vomiting This list may   not describe all possible side effects. Call your doctor for medical advice about side effects. You may report side effects to FDA at 1-800-FDA-1088. Where should I keep my medicine? Keep out of the reach of children. Store in a refrigerator between 2 and 8 degrees C (36 and 46 degrees F). Keep in carton to protect from light. Throw  away this medicine if it is left out of the refrigerator for more than 5 consecutive days. Throw away any unused medicine after the expiration date. NOTE: This sheet is a summary. It may not cover all possible information. If you have questions about this medicine, talk to your doctor, pharmacist, or health care provider.  2020 Elsevier/Gold Standard (2017-10-25 19:58:39)  

## 2018-08-17 ENCOUNTER — Ambulatory Visit: Payer: Federal, State, Local not specified - PPO

## 2018-08-21 ENCOUNTER — Inpatient Hospital Stay: Payer: Federal, State, Local not specified - PPO | Admitting: Physician Assistant

## 2018-08-21 ENCOUNTER — Inpatient Hospital Stay: Payer: Federal, State, Local not specified - PPO

## 2018-08-21 ENCOUNTER — Other Ambulatory Visit: Payer: Self-pay

## 2018-08-21 VITALS — BP 167/101 | HR 86 | Temp 98.0°F | Resp 18 | Ht 69.0 in | Wt 277.6 lb

## 2018-08-21 DIAGNOSIS — Z5111 Encounter for antineoplastic chemotherapy: Secondary | ICD-10-CM | POA: Diagnosis not present

## 2018-08-21 DIAGNOSIS — Z170 Estrogen receptor positive status [ER+]: Secondary | ICD-10-CM | POA: Diagnosis not present

## 2018-08-21 DIAGNOSIS — Z7689 Persons encountering health services in other specified circumstances: Secondary | ICD-10-CM | POA: Diagnosis not present

## 2018-08-21 DIAGNOSIS — C50212 Malignant neoplasm of upper-inner quadrant of left female breast: Secondary | ICD-10-CM | POA: Diagnosis not present

## 2018-08-21 DIAGNOSIS — I158 Other secondary hypertension: Secondary | ICD-10-CM

## 2018-08-21 DIAGNOSIS — Z171 Estrogen receptor negative status [ER-]: Secondary | ICD-10-CM | POA: Diagnosis not present

## 2018-08-21 DIAGNOSIS — Z79899 Other long term (current) drug therapy: Secondary | ICD-10-CM | POA: Diagnosis not present

## 2018-08-21 LAB — COMPREHENSIVE METABOLIC PANEL
ALT: 44 U/L (ref 0–44)
AST: 25 U/L (ref 15–41)
Albumin: 3.6 g/dL (ref 3.5–5.0)
Alkaline Phosphatase: 106 U/L (ref 38–126)
Anion gap: 10 (ref 5–15)
BUN: 11 mg/dL (ref 6–20)
CO2: 25 mmol/L (ref 22–32)
Calcium: 9.1 mg/dL (ref 8.9–10.3)
Chloride: 104 mmol/L (ref 98–111)
Creatinine, Ser: 0.98 mg/dL (ref 0.44–1.00)
GFR calc Af Amer: >60 mL/min (ref >60)
GFR calc non Af Amer: >60 mL/min (ref >60)
Glucose, Bld: 174 mg/dL — ABNORMAL HIGH (ref 70–99)
Potassium: 3.6 mmol/L (ref 3.5–5.1)
Sodium: 139 mmol/L (ref 135–145)
Total Bilirubin: 0.2 mg/dL — ABNORMAL LOW (ref 0.3–1.2)
Total Protein: 7 g/dL (ref 6.5–8.1)

## 2018-08-21 LAB — CBC WITH DIFFERENTIAL/PLATELET
Abs Immature Granulocytes: 0.36 10*3/uL — ABNORMAL HIGH (ref 0.00–0.07)
Basophils Absolute: 0 10*3/uL (ref 0.0–0.1)
Basophils Relative: 1 %
Eosinophils Absolute: 0 10*3/uL (ref 0.0–0.5)
Eosinophils Relative: 1 %
HCT: 31 % — ABNORMAL LOW (ref 36.0–46.0)
Hemoglobin: 10.3 g/dL — ABNORMAL LOW (ref 12.0–15.0)
Immature Granulocytes: 10 %
Lymphocytes Relative: 21 %
Lymphs Abs: 0.8 10*3/uL (ref 0.7–4.0)
MCH: 29.4 pg (ref 26.0–34.0)
MCHC: 33.2 g/dL (ref 30.0–36.0)
MCV: 88.6 fL (ref 80.0–100.0)
Monocytes Absolute: 0.8 10*3/uL (ref 0.1–1.0)
Monocytes Relative: 22 %
Neutro Abs: 1.7 10*3/uL (ref 1.7–7.7)
Neutrophils Relative %: 45 %
Platelets: 321 10*3/uL (ref 150–400)
RBC: 3.5 MIL/uL — ABNORMAL LOW (ref 3.87–5.11)
RDW: 12.7 % (ref 11.5–15.5)
WBC: 3.7 10*3/uL — ABNORMAL LOW (ref 4.0–10.5)
nRBC: 0.5 % — ABNORMAL HIGH (ref 0.0–0.2)

## 2018-08-21 MED ORDER — FAMOTIDINE IN NACL 20-0.9 MG/50ML-% IV SOLN
20.0000 mg | Freq: Once | INTRAVENOUS | Status: AC
  Administered 2018-08-21: 20 mg via INTRAVENOUS

## 2018-08-21 MED ORDER — PALONOSETRON HCL INJECTION 0.25 MG/5ML
0.2500 mg | Freq: Once | INTRAVENOUS | Status: AC
  Administered 2018-08-21: 0.25 mg via INTRAVENOUS

## 2018-08-21 MED ORDER — SODIUM CHLORIDE 0.9% FLUSH
10.0000 mL | INTRAVENOUS | Status: DC | PRN
  Administered 2018-08-21: 10 mL
  Filled 2018-08-21: qty 10

## 2018-08-21 MED ORDER — SODIUM CHLORIDE 0.9 % IV SOLN
Freq: Once | INTRAVENOUS | Status: AC
  Administered 2018-08-21: 11:00:00 via INTRAVENOUS
  Filled 2018-08-21: qty 250

## 2018-08-21 MED ORDER — PALONOSETRON HCL INJECTION 0.25 MG/5ML
INTRAVENOUS | Status: AC
  Filled 2018-08-21: qty 5

## 2018-08-21 MED ORDER — HEPARIN SOD (PORK) LOCK FLUSH 100 UNIT/ML IV SOLN
500.0000 [IU] | Freq: Once | INTRAVENOUS | Status: AC | PRN
  Administered 2018-08-21: 500 [IU]
  Filled 2018-08-21: qty 5

## 2018-08-21 MED ORDER — SODIUM CHLORIDE 0.9 % IV SOLN
600.0000 mg/m2 | Freq: Once | INTRAVENOUS | Status: AC
  Administered 2018-08-21: 1520 mg via INTRAVENOUS
  Filled 2018-08-21: qty 76

## 2018-08-21 MED ORDER — FAMOTIDINE IN NACL 20-0.9 MG/50ML-% IV SOLN
INTRAVENOUS | Status: AC
  Filled 2018-08-21: qty 50

## 2018-08-21 MED ORDER — SODIUM CHLORIDE 0.9 % IV SOLN
Freq: Once | INTRAVENOUS | Status: AC
  Administered 2018-08-21: 11:00:00 via INTRAVENOUS
  Filled 2018-08-21: qty 5

## 2018-08-21 MED ORDER — DOXORUBICIN HCL CHEMO IV INJECTION 2 MG/ML
60.0000 mg/m2 | Freq: Once | INTRAVENOUS | Status: AC
  Administered 2018-08-21: 152 mg via INTRAVENOUS
  Filled 2018-08-21: qty 76

## 2018-08-21 NOTE — Progress Notes (Signed)
[place]  [phone] [phone]     ID: Cassandra Sanders DOB: 1965-02-18  MR#: [id number]  NUU#:[id number]  Patient Care Team: Lennie Odor, PA-C as PCP - General (Nurse Practitioner) Rockwell Germany, RN as Oncology Nurse Navigator Mauro Kaufmann, RN as Oncology Nurse Navigator Magrinat, Virgie Dad, MD as Consulting Physician (Oncology) Rolm Bookbinder, MD as Consulting Physician (General Surgery) Kyung Rudd, MD as Consulting Physician (Radiation Oncology) Marylynn Pearson, MD as Consulting Physician (Obstetrics and Gynecology) Scot Dock, NP OTHER MD:   CHIEF COMPLAINT: Triple negative breast cancer  CURRENT TREATMENT: Adjuvant chemotherapy    INTERVAL HISTORY: Cassandra Sanders returns today for follow-up and treatment of her triple negative breast cancer.   She began on adjuvant chemotherapy on 07/17/2018 with dose dense doxorubicin and cyclophosphamide every 14 days x4. Today is Day 1, Cycle 3.   Her most recent echo on 07/16/2018 showed an EF of 60-65%.   REVIEW OF SYSTEMS: Cassandra Sanders is doing well today. She recently had a dental procedure performed recently. She is improving but does note some residual soreness of her gums and she is trying to get used to having dentures. She uses [facility]y Sox for her symptoms. She took prophylactic antibiotics with amoxicillin prior to her dental procedure.   At her last visit, the patient showed persistent neutropenia and after discussion with the provider, it was determined that the Onpro likely malfunctioned. Going forward, the patient will be returning to the clinic for her neulasta injections. She received two doses of granix last week for her neutropenia.  She is eating and drinking well.  She denies any new issues such as fever, chills, chest pain, palpitations, cough, shortness of breath, bowel/bladder changes, nausea or vomiting.  A detailed ROS was otherwise non contributory.     HISTORY  OF CURRENT ILLNESS: From the original intake note:  Cassandra Sanders had routine screening mammography on 05/15/2018 at [facility] showing a possible abnormality in the left breast. She underwent bilateral diagnostic mammography with tomography and left breast ultrasonography at [facility] on 06/02/2018 showing: breast density category B; suspicious approximate 0.9 cm mass involving the upper inner quadrant of the left breast at posterior depth which account for the screening mammographic finding; no pathologic left axillary lymphadenopathy.  Accordingly on 06/04/2018 she proceeded to biopsy of the left breast area in question. The pathology from this procedure ([id number]) showed: invasive ductal carcinoma, grade 3. Prognostic indicators significant for: estrogen receptor, 60% positive with moderate staining intensity and progesterone receptor, 0% negative. Proliferation marker Ki67 at 80%. HER2 negative by immunohistochemistry (0).  The patient's subsequent history is as detailed below.   MEDICAL HISTORY: Past Medical History:  Diagnosis Date  . Breast cancer ([place]) 06/04/2018   left breast  . Diabetes mellitus without complication ([place])   . Hypertension     ALLERGIES:  has No Known Allergies.  MEDICATIONS:  Current Outpatient Medications  Medication Sig Dispense Refill  . acetaminophen-codeine (TYLENOL #3) 300-30 MG tablet     . aspirin EC 81 MG tablet Take 81 mg by mouth daily.    Marland Kitchen dexamethasone (DECADRON) 4 MG tablet Take 2 tablets by mouth once a day on the day after chemotherapy and then take 2 tablets two times a day for 2 days. Take with food. 30 tablet 1  . ibuprofen (ADVIL) 800 MG tablet     . JANUVIA 100 MG tablet daily.     Marland Kitchen lidocaine-prilocaine (EMLA) cream Apply to affected area  once 30 g 3  . lisinopril (PRINIVIL,ZESTRIL) 10 MG tablet Take 10 mg by mouth daily.  2  . loratadine (CLARITIN) 10 MG tablet Take 1 tablet (10 mg total) by mouth daily. 90  tablet 1  . LORazepam (ATIVAN) 0.5 MG tablet Take 1 tablet (0.5 mg total) by mouth at bedtime as needed and may repeat dose one time if needed (Nausea or vomiting). 30 tablet 0  . METFORMIN HCL PO Take 1,000 mg by mouth daily.     Marland Kitchen omeprazole (PRILOSEC) 40 MG capsule Take 1 capsule (40 mg total) by mouth daily. 30 capsule 0  . oxyCODONE (OXY IR/ROXICODONE) 5 MG immediate release tablet Take 1 tablet (5 mg total) by mouth every 6 (six) hours as needed for moderate pain, severe pain or breakthrough pain. 12 tablet 0  . prochlorperazine (COMPAZINE) 10 MG tablet Take 1 tablet (10 mg total) by mouth every 6 (six) hours as needed (Nausea or vomiting). 30 tablet 1   No current facility-administered medications for this visit.    Facility-Administered Medications Ordered in Other Visits  Medication Dose Route Frequency Provider Last Rate Last Dose  . cyclophosphamide (CYTOXAN) 1,520 mg in sodium chloride 0.9 % 250 mL chemo infusion  600 mg/m2 (Treatment Plan Recorded) Intravenous Once Magrinat, Virgie Dad, MD      . DOXOrubicin (ADRIAMYCIN) chemo injection 152 mg  60 mg/m2 (Treatment Plan Recorded) Intravenous Once Magrinat, Virgie Dad, MD   152 mg at 08/21/18 1216  . heparin lock flush 100 unit/mL  500 Units Intracatheter Once PRN Magrinat, Virgie Dad, MD      . sodium chloride flush (NS) 0.9 % injection 10 mL  10 mL Intracatheter PRN Magrinat, Virgie Dad, MD        SURGICAL HISTORY:  Past Surgical History:  Procedure Laterality Date  . BREAST LUMPECTOMY WITH RADIOACTIVE SEED AND SENTINEL LYMPH NODE BIOPSY Left 06/26/2018   Procedure: LEFT BREAST LUMPECTOMY WITH RADIOACTIVE SEED AND LEFT AXILLARY SENTINEL LYMPH NODE BIOPSY;  Surgeon: Rolm Bookbinder, MD;  Location: [facility];  Service: General;  Laterality: Left;  . PORTACATH PLACEMENT Right 06/26/2018   Procedure: INSERTION PORT-A-CATH WITH ULTRASOUND;  Surgeon: Rolm Bookbinder, MD;  Location: [facility];  Service:  General;  Laterality: Right;   FAMILY HISTORY: No family history on file. Patient's father was in his 18s when he was murdered. Patient's mother is currently (as of 06/2018) at age 102. The patient denies a family hx of breast or ovarian cancer. She has 3 brothers and no biological sisters.   GYNECOLOGIC HISTORY:  Patient's last menstrual period was 06/18/2013. Menarche: 53 years old Age at first live birth: 53 years old [place] P 1 LMP June 2015 Contraceptive used from age [age] with no complications HRT none Hysterectomy? no BSO? 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 She is engaged to [facility]. She lives at home with her son [name], age 52, who works here in [place] as a Museum/gallery exhibitions officer.  The patient attends [employer].                          ADVANCED DIRECTIVES: not in place; at the 06/10/2018 visit the patient was given the appropriate documents to complete and notarize at her discretion.  She intends to name her son is her healthcare power of attorney   HEALTH MAINTENANCE: Social History  Tobacco Use  . Smoking status: Never Smoker  . Smokeless tobacco: Never Used  Substance Use Topics  . Alcohol use: No  . Drug use: No                Colonoscopy: 2016             PAP: 05/15/2018, negative             Bone density: never done              No Known Allergies  PHYSICAL EXAMINATION:  Blood pressure (!) 167/101, pulse 86, temperature 98 F (36.7 C), temperature source Temporal, resp. rate 18, height 5' 9"  (1.753 m), weight 277 lb 9.6 oz (125.9 kg), last menstrual period 06/18/2013, SpO2 99 %.  ECOG PERFORMANCE STATUS: 1 - Symptomatic but completely ambulatory  GENERAL: Patient is a well appearing female in no acute distress  Neck is supple. NODES: No cervical, supraclavicular, or axillary lymphadenopathy palpated BREAST EXAM: Deferred.  LUNGS: Clear to auscultation bilaterally. No wheezes or rhonchi. HEART: Regular rate and rhythm. No murmurappreciated. ABDOMEN:Soft, nontender. Positive, normoactivebowel sounds. No organomegaly palpated. MSK: No focal spinal tenderness to palpation. Full range of motion bilaterally in the upper extremities. EXTREMITIES: No peripheral edema.  SKIN: Clear with no obvious rashes or skin changes. No nail dyscrasia. NEURO: Nonfocal.Well oriented. Appropriate affect.  LABORATORY DATA: Lab Results  Component Value Date   WBC 3.7 (L) 08/21/2018   HGB 10.3 (L) 08/21/2018   HCT 31.0 (L) 08/21/2018   MCV 88.6 08/21/2018   PLT 321 08/21/2018      Chemistry      Component Value Date/Time   NA 139 08/21/2018 0906   K 3.6 08/21/2018 0906   CL 104 08/21/2018 0906   CO2 25 08/21/2018 0906   BUN 11 08/21/2018 0906   CREATININE 0.98 08/21/2018 0906   CREATININE 1.14 (H) 06/10/2018 0859      Component Value Date/Time   CALCIUM 9.1 08/21/2018 0906   ALKPHOS 106 08/21/2018 0906   AST 25 08/21/2018 0906   AST 13 (L) 06/10/2018 0859   ALT 44 08/21/2018 0906   ALT 17 06/10/2018 0859   BILITOT 0.2 (L) 08/21/2018 0906   BILITOT 0.3 06/10/2018 0859       RADIOGRAPHIC STUDIES:  No results found.   ASSESSMENT/PLAN:  ELIGIBLE FOR AVAILABLE RESEARCH PROTOCOL: no  ASSESSMENT: 53 y.o. [place] woman status post left breast upper inner quadrant biopsy 06/04/2018 for a clinical T1b N0, stage IB invasive ductal carcinoma, grade 3, estrogen receptor moderately positive, progesterone receptor negative, with an MIB-1 of 80%, and HER-2 nonamplified  (1) left lumpectomy and sentinel lymph node sampling 06/26/2018 showed a pT1c pN0, stage IB invasive ductal carcinoma, grade 3, with negative margins             (a) repeat prognostic panel triple negative             (b) a total of 3 sentinel lymph nodes were removed  (2) Oncotype score of 56 obtained from the 06/04/2018 biopsy  predicts a risk of outside the breast recurrence over the next 9 years of nearly 40% if the patient's only systemic therapy is antiestrogens for 5 years.  It also predicts a significant benefit from chemotherapy.             (a) Oncotype reads the tumor also as triple negative.  (3) adjuvant chemo therapy will consist of cyclophosphamide and doxorubicin in dose dense fashion x4 starting 07/17/2018, followed  by weekly paclitaxel and carboplatin x12             (a) echo on 07/16/2018 shows EF of 60-65%  (4) adjuvant radiation to follow chemotherapy  PLAN: Judythe is doing well today. Her WBC has increased since last week after having received granix x2. Her labs are acceptable for treatment today. She will receive treatment with day 1 of cycle 3 today as scheduled. She will return for her neulasta injection tomorrow.   The patient's blood pressure was noted while in the clinic today. Upon recheck, her blood pressure was still persistently elevated. She is prescribed lisinopril for her blood pressure and did take her medication as prescribed this morning. She was advised to monitor her blood pressure at home and keep a log of her readings. She was given a one time dose of 0.1 of clonidine while in the clinic today.   She will continue to use orajel for her oral pain from her dental procedure.   Genesis will return in 2 weeks for labs, f/u, and her 4th of Doxorubicin and Cyclophosphamide.  She was recommended to continue with the appropriate pandemic precautions. She knows to call for any questions that may arise between now and her next appointment.  We are happy to see her sooner if needed.  No orders of the defined types were placed in this encounter.    Victorian Gunn L Kimie Pidcock, PA-C 08/21/18

## 2018-08-21 NOTE — Progress Notes (Signed)
The following biosimilar Udenyca ([id number]) has been selected for use in this patient.  Confirmed w/ PA team that Josem Kaufmann is not required per pts insurance.    Kennith Center, Pharm.D., CPP 08/21/2018@11 :53 AM

## 2018-08-21 NOTE — Patient Instructions (Signed)
[facility] Discharge Instructions for Patients Receiving Chemotherapy  Today you received the following chemotherapy agents:  Doxorubicin (Adriamycin), Cyclophosphamide (Cytoxan)  To help prevent nausea and vomiting after your treatment, we encourage you to take your nausea medication as prescribed.   If you develop nausea and vomiting that is not controlled by your nausea medication, call the clinic.   BELOW ARE SYMPTOMS THAT SHOULD BE REPORTED IMMEDIATELY:  *FEVER GREATER THAN 100.5 F  *CHILLS WITH OR WITHOUT FEVER  NAUSEA AND VOMITING THAT IS NOT CONTROLLED WITH YOUR NAUSEA MEDICATION  *UNUSUAL SHORTNESS OF BREATH  *UNUSUAL BRUISING OR BLEEDING  TENDERNESS IN MOUTH AND THROAT WITH OR WITHOUT PRESENCE OF ULCERS  *URINARY PROBLEMS  *BOWEL PROBLEMS  UNUSUAL RASH Items with * indicate a potential emergency and should be followed up as soon as possible.  Feel free to call the clinic should you have any questions or concerns. The clinic phone number is [phone].  Please show the [facility] at check-in to the Emergency Department and triage nurse.

## 2018-08-22 ENCOUNTER — Other Ambulatory Visit: Payer: Self-pay

## 2018-08-22 ENCOUNTER — Other Ambulatory Visit: Payer: Self-pay | Admitting: Adult Health

## 2018-08-22 ENCOUNTER — Inpatient Hospital Stay: Payer: Federal, State, Local not specified - PPO

## 2018-08-22 VITALS — BP 167/100 | HR 111 | Temp 98.1°F | Resp 16

## 2018-08-22 DIAGNOSIS — C50212 Malignant neoplasm of upper-inner quadrant of left female breast: Secondary | ICD-10-CM

## 2018-08-22 DIAGNOSIS — Z171 Estrogen receptor negative status [ER-]: Secondary | ICD-10-CM | POA: Diagnosis not present

## 2018-08-22 DIAGNOSIS — Z170 Estrogen receptor positive status [ER+]: Secondary | ICD-10-CM

## 2018-08-22 DIAGNOSIS — Z79899 Other long term (current) drug therapy: Secondary | ICD-10-CM | POA: Diagnosis not present

## 2018-08-22 DIAGNOSIS — Z7689 Persons encountering health services in other specified circumstances: Secondary | ICD-10-CM | POA: Diagnosis not present

## 2018-08-22 DIAGNOSIS — Z5111 Encounter for antineoplastic chemotherapy: Secondary | ICD-10-CM | POA: Diagnosis not present

## 2018-08-22 MED ORDER — PEGFILGRASTIM-CBQV 6 MG/0.6ML ~~LOC~~ SOSY
6.0000 mg | PREFILLED_SYRINGE | Freq: Once | SUBCUTANEOUS | Status: AC
  Administered 2018-08-22: 6 mg via SUBCUTANEOUS

## 2018-08-22 MED ORDER — PEGFILGRASTIM-CBQV 6 MG/0.6ML ~~LOC~~ SOSY
PREFILLED_SYRINGE | SUBCUTANEOUS | Status: AC
  Filled 2018-08-22: qty 0.6

## 2018-08-22 NOTE — Patient Instructions (Signed)

## 2018-08-28 ENCOUNTER — Ambulatory Visit: Payer: Federal, State, Local not specified - PPO

## 2018-08-28 ENCOUNTER — Other Ambulatory Visit: Payer: Federal, State, Local not specified - PPO

## 2018-08-28 ENCOUNTER — Ambulatory Visit: Payer: Federal, State, Local not specified - PPO | Admitting: Adult Health

## 2018-09-02 ENCOUNTER — Telehealth: Payer: Self-pay | Admitting: Adult Health

## 2018-09-02 NOTE — Telephone Encounter (Signed)
Left message - called pt per 8/25 sch message - left message with appt time change for 8/27 appt.

## 2018-09-03 ENCOUNTER — Ambulatory Visit: Payer: Federal, State, Local not specified - PPO | Admitting: Adult Health

## 2018-09-03 ENCOUNTER — Other Ambulatory Visit: Payer: Self-pay

## 2018-09-03 ENCOUNTER — Other Ambulatory Visit: Payer: Federal, State, Local not specified - PPO

## 2018-09-03 ENCOUNTER — Inpatient Hospital Stay: Payer: Federal, State, Local not specified - PPO

## 2018-09-03 ENCOUNTER — Ambulatory Visit: Payer: Federal, State, Local not specified - PPO

## 2018-09-03 ENCOUNTER — Inpatient Hospital Stay (HOSPITAL_BASED_OUTPATIENT_CLINIC_OR_DEPARTMENT_OTHER): Payer: Federal, State, Local not specified - PPO | Admitting: Adult Health

## 2018-09-03 ENCOUNTER — Encounter: Payer: Self-pay | Admitting: Adult Health

## 2018-09-03 VITALS — BP 162/96 | HR 97 | Temp 98.0°F | Resp 18 | Ht 69.0 in | Wt 274.3 lb

## 2018-09-03 DIAGNOSIS — Z7689 Persons encountering health services in other specified circumstances: Secondary | ICD-10-CM | POA: Diagnosis not present

## 2018-09-03 DIAGNOSIS — I10 Essential (primary) hypertension: Secondary | ICD-10-CM | POA: Diagnosis not present

## 2018-09-03 DIAGNOSIS — Z171 Estrogen receptor negative status [ER-]: Secondary | ICD-10-CM | POA: Diagnosis not present

## 2018-09-03 DIAGNOSIS — Z170 Estrogen receptor positive status [ER+]: Secondary | ICD-10-CM

## 2018-09-03 DIAGNOSIS — C50212 Malignant neoplasm of upper-inner quadrant of left female breast: Secondary | ICD-10-CM

## 2018-09-03 DIAGNOSIS — Z95828 Presence of other vascular implants and grafts: Secondary | ICD-10-CM

## 2018-09-03 DIAGNOSIS — Z79899 Other long term (current) drug therapy: Secondary | ICD-10-CM | POA: Diagnosis not present

## 2018-09-03 DIAGNOSIS — Z5111 Encounter for antineoplastic chemotherapy: Secondary | ICD-10-CM | POA: Diagnosis not present

## 2018-09-03 DIAGNOSIS — E1169 Type 2 diabetes mellitus with other specified complication: Secondary | ICD-10-CM | POA: Diagnosis not present

## 2018-09-03 LAB — COMPREHENSIVE METABOLIC PANEL
ALT: 21 U/L (ref 0–44)
AST: 16 U/L (ref 15–41)
Albumin: 3.9 g/dL (ref 3.5–5.0)
Alkaline Phosphatase: 104 U/L (ref 38–126)
Anion gap: 8 (ref 5–15)
BUN: 5 mg/dL — ABNORMAL LOW (ref 6–20)
CO2: 27 mmol/L (ref 22–32)
Calcium: 9.3 mg/dL (ref 8.9–10.3)
Chloride: 104 mmol/L (ref 98–111)
Creatinine, Ser: 0.86 mg/dL (ref 0.44–1.00)
GFR calc Af Amer: >60 mL/min (ref >60)
GFR calc non Af Amer: >60 mL/min (ref >60)
Glucose, Bld: 151 mg/dL — ABNORMAL HIGH (ref 70–99)
Potassium: 3.7 mmol/L (ref 3.5–5.1)
Sodium: 139 mmol/L (ref 135–145)
Total Bilirubin: 0.3 mg/dL (ref 0.3–1.2)
Total Protein: 7 g/dL (ref 6.5–8.1)

## 2018-09-03 LAB — CBC WITH DIFFERENTIAL/PLATELET
Abs Immature Granulocytes: 0.52 10*3/uL — ABNORMAL HIGH (ref 0.00–0.07)
Basophils Absolute: 0 10*3/uL (ref 0.0–0.1)
Basophils Relative: 1 %
Eosinophils Absolute: 0 10*3/uL (ref 0.0–0.5)
Eosinophils Relative: 0 %
HCT: 30.1 % — ABNORMAL LOW (ref 36.0–46.0)
Hemoglobin: 9.9 g/dL — ABNORMAL LOW (ref 12.0–15.0)
Immature Granulocytes: 10 %
Lymphocytes Relative: 10 %
Lymphs Abs: 0.5 10*3/uL — ABNORMAL LOW (ref 0.7–4.0)
MCH: 29.4 pg (ref 26.0–34.0)
MCHC: 32.9 g/dL (ref 30.0–36.0)
MCV: 89.3 fL (ref 80.0–100.0)
Monocytes Absolute: 0.6 10*3/uL (ref 0.1–1.0)
Monocytes Relative: 12 %
Neutro Abs: 3.4 10*3/uL (ref 1.7–7.7)
Neutrophils Relative %: 67 %
Platelets: 191 10*3/uL (ref 150–400)
RBC: 3.37 MIL/uL — ABNORMAL LOW (ref 3.87–5.11)
RDW: 14.4 % (ref 11.5–15.5)
WBC: 5.1 10*3/uL (ref 4.0–10.5)
nRBC: 0.8 % — ABNORMAL HIGH (ref 0.0–0.2)

## 2018-09-03 MED ORDER — SODIUM CHLORIDE 0.9% FLUSH
10.0000 mL | Freq: Once | INTRAVENOUS | Status: AC
  Administered 2018-09-03: 10 mL
  Filled 2018-09-03: qty 10

## 2018-09-03 MED ORDER — FAMOTIDINE IN NACL 20-0.9 MG/50ML-% IV SOLN
20.0000 mg | Freq: Once | INTRAVENOUS | Status: AC
  Administered 2018-09-03: 12:00:00 20 mg via INTRAVENOUS

## 2018-09-03 MED ORDER — FAMOTIDINE IN NACL 20-0.9 MG/50ML-% IV SOLN
INTRAVENOUS | Status: AC
  Filled 2018-09-03: qty 50

## 2018-09-03 MED ORDER — SODIUM CHLORIDE 0.9 % IV SOLN
600.0000 mg/m2 | Freq: Once | INTRAVENOUS | Status: AC
  Administered 2018-09-03: 13:00:00 1520 mg via INTRAVENOUS
  Filled 2018-09-03: qty 76

## 2018-09-03 MED ORDER — PALONOSETRON HCL INJECTION 0.25 MG/5ML
INTRAVENOUS | Status: AC
  Filled 2018-09-03: qty 5

## 2018-09-03 MED ORDER — PALONOSETRON HCL INJECTION 0.25 MG/5ML
0.2500 mg | Freq: Once | INTRAVENOUS | Status: AC
  Administered 2018-09-03: 0.25 mg via INTRAVENOUS

## 2018-09-03 MED ORDER — HEPARIN SOD (PORK) LOCK FLUSH 100 UNIT/ML IV SOLN
500.0000 [IU] | Freq: Once | INTRAVENOUS | Status: AC | PRN
  Administered 2018-09-03: 14:00:00 500 [IU]
  Filled 2018-09-03: qty 5

## 2018-09-03 MED ORDER — SODIUM CHLORIDE 0.9 % IV SOLN
Freq: Once | INTRAVENOUS | Status: AC
  Administered 2018-09-03: 12:00:00 via INTRAVENOUS
  Filled 2018-09-03: qty 250

## 2018-09-03 MED ORDER — SODIUM CHLORIDE 0.9 % IV SOLN
Freq: Once | INTRAVENOUS | Status: AC
  Administered 2018-09-03: 12:00:00 via INTRAVENOUS
  Filled 2018-09-03: qty 5

## 2018-09-03 MED ORDER — SODIUM CHLORIDE 0.9% FLUSH
10.0000 mL | INTRAVENOUS | Status: DC | PRN
  Administered 2018-09-03: 10 mL
  Filled 2018-09-03: qty 10

## 2018-09-03 MED ORDER — DOXORUBICIN HCL CHEMO IV INJECTION 2 MG/ML
60.0000 mg/m2 | Freq: Once | INTRAVENOUS | Status: AC
  Administered 2018-09-03: 152 mg via INTRAVENOUS
  Filled 2018-09-03: qty 76

## 2018-09-03 NOTE — Progress Notes (Signed)
Per Mendel Ryder, NP ok to treat with elevated BP today.

## 2018-09-03 NOTE — Patient Instructions (Signed)
[facility] Discharge Instructions for Patients Receiving Chemotherapy  Today you received the following chemotherapy agents:  Doxorubicin (Adriamycin), Cyclophosphamide (Cytoxan)  To help prevent nausea and vomiting after your treatment, we encourage you to take your nausea medication as prescribed.   If you develop nausea and vomiting that is not controlled by your nausea medication, call the clinic.   BELOW ARE SYMPTOMS THAT SHOULD BE REPORTED IMMEDIATELY:  *FEVER GREATER THAN 100.5 F  *CHILLS WITH OR WITHOUT FEVER  NAUSEA AND VOMITING THAT IS NOT CONTROLLED WITH YOUR NAUSEA MEDICATION  *UNUSUAL SHORTNESS OF BREATH  *UNUSUAL BRUISING OR BLEEDING  TENDERNESS IN MOUTH AND THROAT WITH OR WITHOUT PRESENCE OF ULCERS  *URINARY PROBLEMS  *BOWEL PROBLEMS  UNUSUAL RASH Items with * indicate a potential emergency and should be followed up as soon as possible.  Feel free to call the clinic should you have any questions or concerns. The clinic phone number is [phone].  Please show the CHEMO ALERT CARD at check-in to the Emergency Department and triage nurse.   

## 2018-09-03 NOTE — Progress Notes (Addendum)
[place]  [phone] [phone]     ID: Cassandra Sanders DOB: Aug 25, 1965  MR#: [id number]  VJK#:[id number]  Patient Care Team: Lennie Odor, PA-C as PCP - General (Nurse Practitioner) Rockwell Germany, RN as Oncology Nurse Navigator Mauro Kaufmann, RN as Oncology Nurse Navigator Magrinat, Virgie Dad, MD as Consulting Physician (Oncology) Rolm Bookbinder, MD as Consulting Physician (General Surgery) Kyung Rudd, MD as Consulting Physician (Radiation Oncology) Marylynn Pearson, MD as Consulting Physician (Obstetrics and Gynecology) Scot Dock, NP OTHER MD:   CHIEF COMPLAINT: Triple negative breast cancer  CURRENT TREATMENT: Adjuvant chemotherapy   INTERVAL HISTORY: Gresia returns today for follow-up and treatment of her triple negative breast cancer.   She began on adjuvant chemotherapy on 07/17/2018 with dose dense doxorubicin and cyclophosphamide every 14 days x4. Today is Day 1, Cycle 4.   Her most recent echo on 07/16/2018 showed an EF of 60-65%.   REVIEW OF SYSTEMS: Aicha is doing well today.  She has some continued taste changes.  She has no oral ulcers, and is on an oral care regimen of rinsing her mouth out with salt water mixed with baking soda three times a day, in addition to orajel and biotene if needed.  Kela has remained active and continues to work from home, and walk daily.  She had some mild nausea after her third treatment that resolved with anti emetics.    Sumie denies any unusual headaches or vision changes.  She is without fever, chills, chest pain, or palpitations.  She has had no vomiting, bowel/bladder changes, fatigue.  A detailed ROS was otherwise non contributory.    HISTORY OF CURRENT ILLNESS: From the original intake note:  Adda Stokes had routine screening mammography on 05/15/2018 at [facility] showing a possible abnormality in the left breast. She underwent bilateral diagnostic  mammography with tomography and left breast ultrasonography at [facility] on 06/02/2018 showing: breast density category B; suspicious approximate 0.9 cm mass involving the upper inner quadrant of the left breast at posterior depth which account for the screening mammographic finding; no pathologic left axillary lymphadenopathy.  Accordingly on 06/04/2018 she proceeded to biopsy of the left breast area in question. The pathology from this procedure ([id number]) showed: invasive ductal carcinoma, grade 3. Prognostic indicators significant for: estrogen receptor, 60% positive with moderate staining intensity and progesterone receptor, 0% negative. Proliferation marker Ki67 at 80%. HER2 negative by immunohistochemistry (0).  The patient's subsequent history is as detailed below.   PAST MEDICAL HISTORY: Past Medical History:  Diagnosis Date  . Breast cancer ([place]) 06/04/2018   left breast  . Diabetes mellitus without complication ([place])   . Hypertension     PAST SURGICAL HISTORY: Past Surgical History:  Procedure Laterality Date  . BREAST LUMPECTOMY WITH RADIOACTIVE SEED AND SENTINEL LYMPH NODE BIOPSY Left 06/26/2018   Procedure: LEFT BREAST LUMPECTOMY WITH RADIOACTIVE SEED AND LEFT AXILLARY SENTINEL LYMPH NODE BIOPSY;  Surgeon: Rolm Bookbinder, MD;  Location: [facility];  Service: General;  Laterality: Left;  . PORTACATH PLACEMENT Right 06/26/2018   Procedure: INSERTION PORT-A-CATH WITH ULTRASOUND;  Surgeon: Rolm Bookbinder, MD;  Location: [facility];  Service: General;  Laterality: Right;    FAMILY HISTORY: No family history on file. Patient's father was in his 57s when he was murdered. Patient's mother is currently (as of 06/2018) at age 70. The patient denies a family hx of breast or ovarian cancer. She has 3 brothers and no  biological sisters.   GYNECOLOGIC HISTORY:  Patient's last menstrual period was 06/18/2013. Menarche: 53 years old Age at  first live birth: 53 years old [place] P 1 LMP June 2015 Contraceptive used from age [age] with no complications HRT none  Hysterectomy? no BSO? 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 She is engaged to [facility]. She lives at home with her son [name], age 48, who works here in [place] as a Museum/gallery exhibitions officer.  The patient attends [employer].    ADVANCED DIRECTIVES: not in place; at the 06/10/2018 visit the patient was given the appropriate documents to complete and notarize at her discretion.  She intends to name her son is her healthcare power of attorney   HEALTH MAINTENANCE: Social History   Tobacco Use  . Smoking status: Never Smoker  . Smokeless tobacco: Never Used  Substance Use Topics  . Alcohol use: No  . Drug use: No     Colonoscopy: 2016  PAP: 05/15/2018, negative  Bone density: never done   No Known Allergies  Current Outpatient Medications  Medication Sig Dispense Refill  . acetaminophen-codeine (TYLENOL #3) 300-30 MG tablet     . aspirin EC 81 MG tablet Take 81 mg by mouth daily.    Marland Kitchen dexamethasone (DECADRON) 4 MG tablet Take 2 tablets by mouth once a day on the day after chemotherapy and then take 2 tablets two times a day for 2 days. Take with food. 30 tablet 1  . ibuprofen (ADVIL) 800 MG tablet     . JANUVIA 100 MG tablet daily.     Marland Kitchen lidocaine-prilocaine (EMLA) cream Apply to affected area once 30 g 3  . lisinopril (PRINIVIL,ZESTRIL) 10 MG tablet Take 10 mg by mouth daily.  2  . loratadine (CLARITIN) 10 MG tablet Take 1 tablet (10 mg total) by mouth daily. 90 tablet 1  . LORazepam (ATIVAN) 0.5 MG tablet Take 1 tablet (0.5 mg total) by mouth at bedtime as needed and may repeat dose one time if needed (Nausea or vomiting). 30 tablet 0  . METFORMIN HCL PO Take 1,000 mg by mouth daily.     Marland Kitchen omeprazole (PRILOSEC) 40 MG capsule TAKE 1 CAPSULE BY MOUTH EVERY  DAY 30 capsule 0  . oxyCODONE (OXY IR/ROXICODONE) 5 MG immediate release tablet Take 1 tablet (5 mg total) by mouth every 6 (six) hours as needed for moderate pain, severe pain or breakthrough pain. 12 tablet 0  . prochlorperazine (COMPAZINE) 10 MG tablet Take 1 tablet (10 mg total) by mouth every 6 (six) hours as needed (Nausea or vomiting). 30 tablet 1   No current facility-administered medications for this visit.     OBJECTIVE:  Vitals:   09/03/18 1020  BP: (!) 162/96  Pulse: 97  Resp: 18  Temp: 98 F (36.7 C)  SpO2: 100%   Body mass index is 40.51 kg/m.  Wt Readings from Last 3 Encounters:  09/03/18 274 lb 4.8 oz (124.4 kg)  08/21/18 277 lb 9.6 oz (125.9 kg)  08/14/18 277 lb 1.6 oz (125.7 kg)  ECOG FS:1 - Symptomatic but completely ambulatory  GENERAL: Patient is a well appearing female in no acute distress HEENT:  Sclerae anicteric.  Oropharynx clear and moist. No ulcerations or evidence of oropharyngeal candidiasis. Neck is supple.  NODES:  No cervical, supraclavicular, or axillary lymphadenopathy palpated BREAST EXAM:  Deferred. LUNGS:  Clear to auscultation bilaterally.  No wheezes or rhonchi. HEART:  Regular rate and rhythm. No murmur appreciated. ABDOMEN:  Soft, nontender.  Positive, normoactive bowel sounds. No organomegaly palpated. MSK:  No focal spinal tenderness to palpation. Full range of motion bilaterally in the upper extremities. EXTREMITIES:  No peripheral edema.   SKIN:  Clear with no obvious rashes or skin changes. No nail dyscrasia. NEURO:  Nonfocal. Well oriented.  Appropriate affect.   LAB RESULTS:  CMP     Component Value Date/Time   NA 139 08/21/2018 0906   K 3.6 08/21/2018 0906   CL 104 08/21/2018 0906   CO2 25 08/21/2018 0906   GLUCOSE 174 (H) 08/21/2018 0906   BUN 11 08/21/2018 0906   CREATININE 0.98 08/21/2018 0906   CREATININE 1.14 (H) 06/10/2018 0859   CALCIUM 9.1 08/21/2018 0906   PROT 7.0 08/21/2018 0906   ALBUMIN 3.6 08/21/2018  0906   AST 25 08/21/2018 0906   AST 13 (L) 06/10/2018 0859   ALT 44 08/21/2018 0906   ALT 17 06/10/2018 0859   ALKPHOS 106 08/21/2018 0906   BILITOT 0.2 (L) 08/21/2018 0906   BILITOT 0.3 06/10/2018 0859   GFRNONAA >60 08/21/2018 0906   GFRNONAA 54 (L) 06/10/2018 0859   GFRAA >60 08/21/2018 0906   GFRAA >60 06/10/2018 0859    No results found for: TOTALPROTELP, ALBUMINELP, A1GS, A2GS, BETS, BETA2SER, GAMS, MSPIKE, SPEI  No results found for: KPAFRELGTCHN, LAMBDASER, [facility]  Lab Results  Component Value Date   WBC 5.1 09/03/2018   NEUTROABS PENDING 09/03/2018   HGB 9.9 (L) 09/03/2018   HCT 30.1 (L) 09/03/2018   MCV 89.3 09/03/2018   PLT 191 09/03/2018    @LASTCHEMISTRY @  No results found for: LABCA2  No components found for: MIWOEH212  No results for input(s): INR in the last 168 hours.  No results found for: LABCA2  No results found for: YQM250  No results found for: IBB048  No results found for: GQB169  No results found for: CA2729  No components found for: HGQUANT  No results found for: CEA1 / No results found for: CEA1   No results found for: AFPTUMOR  No results found for: CHROMOGRNA  No results found for: PSA1  Appointment on 09/03/2018  Component Date Value Ref Range Status  . WBC 09/03/2018 5.1  4.0 - 10.5 K/uL Final  . RBC 09/03/2018 3.37* 3.87 - 5.11 MIL/uL Final  . Hemoglobin 09/03/2018 9.9* 12.0 - 15.0 g/dL Final  . HCT 09/03/2018 30.1* 36.0 - 46.0 % Final  . MCV 09/03/2018 89.3  80.0 - 100.0 fL Final  . MCH 09/03/2018 29.4  26.0 - 34.0 pg Final  . MCHC 09/03/2018 32.9  30.0 - 36.0 g/dL Final  . RDW 09/03/2018 14.4  11.5 - 15.5 % Final  . Platelets 09/03/2018 191  150 - 400 K/uL Final  . nRBC 09/03/2018 0.8* 0.0 - 0.2 % Final   Performed at [facility], [place] [address] [place], [place] [address]  . Neutrophils Relative % 09/03/2018 PENDING  % Incomplete  . Neutro Abs 09/03/2018 PENDING  1.7 - 7.7 K/uL  Incomplete  . Band Neutrophils 09/03/2018 PENDING  % Incomplete  . Lymphocytes Relative 09/03/2018 PENDING  % Incomplete  . Lymphs Abs 09/03/2018 PENDING  0.7 - 4.0 K/uL Incomplete  . Monocytes Relative 09/03/2018 PENDING  % Incomplete  . Monocytes Absolute 09/03/2018 PENDING  0.1 - 1.0 K/uL Incomplete  . Eosinophils Relative 09/03/2018 PENDING  % Incomplete  . Eosinophils Absolute 09/03/2018 PENDING  0.0 - 0.5 K/uL Incomplete  . Basophils Relative 09/03/2018 PENDING  % Incomplete  . Basophils Absolute 09/03/2018 PENDING  0.0 - 0.1 K/uL Incomplete  . WBC Morphology 09/03/2018 PENDING   Incomplete  . RBC Morphology 09/03/2018 PENDING   Incomplete  . Smear Review 09/03/2018 PENDING   Incomplete  . Other 09/03/2018 PENDING  % Incomplete  . nRBC 09/03/2018 PENDING  0 /100 WBC Incomplete  . Metamyelocytes Relative 09/03/2018 PENDING  % Incomplete  . Myelocytes 09/03/2018 PENDING  % Incomplete  . Promyelocytes Relative 09/03/2018 PENDING  % Incomplete  . Blasts 09/03/2018 PENDING  % Incomplete    (this displays the last labs from the last 3 days)  No results found for: TOTALPROTELP, ALBUMINELP, A1GS, A2GS, BETS, BETA2SER, GAMS, MSPIKE, SPEI (this displays SPEP labs)  No results found for: KPAFRELGTCHN, LAMBDASER, KAPLAMBRATIO (kappa/lambda light chains)  No results found for: HGBA, HGBA2QUANT, HGBFQUANT, HGBSQUAN (Hemoglobinopathy evaluation)   No results found for: LDH  No results found for: IRON, TIBC, IRONPCTSAT (Iron and TIBC)  No results found for: FERRITIN  Urinalysis No results found for: COLORURINE, APPEARANCEUR, LABSPEC, PHURINE, GLUCOSEU, HGBUR, BILIRUBINUR, KETONESUR, PROTEINUR, UROBILINOGEN, NITRITE, LEUKOCYTESUR   STUDIES: No results found.  ELIGIBLE FOR AVAILABLE RESEARCH PROTOCOL: no  ASSESSMENT: 53 y.o. [place] woman status post left breast upper inner quadrant biopsy 06/04/2018 for a clinical T1b N0, stage IB invasive ductal carcinoma, grade 3, estrogen  receptor moderately positive, progesterone receptor negative, with an MIB-1 of 80%, and HER-2 nonamplified  (1) left lumpectomy and sentinel lymph node sampling 06/26/2018 showed a pT1c pN0, stage IB invasive ductal carcinoma, grade 3, with negative margins  (a) repeat prognostic panel triple negative  (b) a total of 3 sentinel lymph nodes were removed  (2) Oncotype score of 56 obtained from the 06/04/2018 biopsy predicts a risk of outside the breast recurrence over the next 9 years of nearly 40% if the patient's only systemic therapy is antiestrogens for 5 years.  It also predicts a significant benefit from chemotherapy.  (a) Oncotype reads the tumor also as triple negative.  (3) adjuvant chemo therapy will consist of cyclophosphamide and doxorubicin in dose dense fashion x4 starting 07/17/2018, followed by weekly paclitaxel and carboplatin x12  (a) echo on 07/16/2018 shows EF of 60-65%  (4) adjuvant radiation to follow chemotherapy  PLAN: Marice continues to tolerate her treatment well with the Doxorubicin and cyclophosphamide.  I reviewed her CBC today which is stable and she will proceed with her fourth cycle of this today (so long as CMET is within parameters).  She met with Dr. Jana Hakim today to review how she has done thus far in treatment and her upcoming chemotherapy regimen of weekly Paclitaxel and Carboplatin.    Kaysea remains active, which is great.  I encouraged her to continue to do so.  Her oral issue is under control with her mouth rinses and oral regimen.  She was recommended to try a drop or two of vinegar to help with her taste changes, but was cautioned that it may be as much as 6-8 weeks after her treatment is finished.  Rickayla and I discussed briefly her upcoming chemotherapy regimen with Paclitaxel and Carboplatin.  We talked about cryotherapy.  She was recommended to bring plastic bags to her next treatment appointment.    Victoria will return in 2 weeks for labs, f/u with Dr.  Jana Hakim, and her next treatment appointment.  She was recommended to continue with the appropriate pandemic precautions. She knows to call for  any questions that may arise between now and her next appointment.  We are happy to see her sooner if needed.   Wilber Bihari, NP Medical Oncology and Hematology [facility] [address] [place], [place] [address] Tel. [phone]    Fax. [phone]   ADDENDUM: Chapel has done remarkably well with her chemotherapy and is completing the final cycle of the more intensive treatment today. She has been able to continue to work and is following appropriate pandemic precautions. The worst problem she experienced is the altered taste and we discussed some ways to possibly bypass this  She is now ready to start the weekly treatments with paclitaxel and carboplatin, which are considerably less intense. She will see me in two weeks at the start of the series to discuss possible toxicities further. She wil then be ready to proceed to adjuvant radiaiton  I personally saw this patient and performed a substantive portion of this encounter with the listed APP documented above.   Chauncey Cruel, MD Medical Oncology and Hematology [facility] [address] [place], [place] [address] Tel. [phone]    Fax. [phone]

## 2018-09-04 ENCOUNTER — Telehealth: Payer: Self-pay | Admitting: Adult Health

## 2018-09-04 NOTE — Telephone Encounter (Signed)
I left a message regarding adjusted schedule

## 2018-09-05 ENCOUNTER — Inpatient Hospital Stay: Payer: Federal, State, Local not specified - PPO

## 2018-09-05 ENCOUNTER — Other Ambulatory Visit: Payer: Self-pay

## 2018-09-05 VITALS — BP 161/80 | HR 98 | Temp 98.2°F | Resp 18

## 2018-09-05 DIAGNOSIS — Z7689 Persons encountering health services in other specified circumstances: Secondary | ICD-10-CM | POA: Diagnosis not present

## 2018-09-05 DIAGNOSIS — Z5111 Encounter for antineoplastic chemotherapy: Secondary | ICD-10-CM | POA: Diagnosis not present

## 2018-09-05 DIAGNOSIS — Z170 Estrogen receptor positive status [ER+]: Secondary | ICD-10-CM

## 2018-09-05 DIAGNOSIS — Z79899 Other long term (current) drug therapy: Secondary | ICD-10-CM | POA: Diagnosis not present

## 2018-09-05 DIAGNOSIS — C50212 Malignant neoplasm of upper-inner quadrant of left female breast: Secondary | ICD-10-CM

## 2018-09-05 DIAGNOSIS — Z171 Estrogen receptor negative status [ER-]: Secondary | ICD-10-CM | POA: Diagnosis not present

## 2018-09-05 MED ORDER — PEGFILGRASTIM-CBQV 6 MG/0.6ML ~~LOC~~ SOSY
PREFILLED_SYRINGE | SUBCUTANEOUS | Status: AC
  Filled 2018-09-05: qty 0.6

## 2018-09-05 MED ORDER — PEGFILGRASTIM-CBQV 6 MG/0.6ML ~~LOC~~ SOSY
6.0000 mg | PREFILLED_SYRINGE | Freq: Once | SUBCUTANEOUS | Status: AC
  Administered 2018-09-05: 6 mg via SUBCUTANEOUS

## 2018-09-05 NOTE — Patient Instructions (Signed)

## 2018-09-07 ENCOUNTER — Other Ambulatory Visit: Payer: Self-pay | Admitting: Adult Health

## 2018-09-07 DIAGNOSIS — C50212 Malignant neoplasm of upper-inner quadrant of left female breast: Secondary | ICD-10-CM

## 2018-09-07 DIAGNOSIS — Z170 Estrogen receptor positive status [ER+]: Secondary | ICD-10-CM

## 2018-09-08 ENCOUNTER — Other Ambulatory Visit: Payer: Self-pay | Admitting: Oncology

## 2018-09-11 ENCOUNTER — Other Ambulatory Visit: Payer: Federal, State, Local not specified - PPO

## 2018-09-11 ENCOUNTER — Ambulatory Visit: Payer: Federal, State, Local not specified - PPO

## 2018-09-17 NOTE — Progress Notes (Signed)
[place]  [phone] [phone]     ID: Cassandra Sanders DOB: Oct 02, 1965  MR#: [id number]  GGY#:[id number]  Patient Care Team: Lennie Odor, PA-C as PCP - General (Nurse Practitioner) Rockwell Germany, RN as Oncology Nurse Navigator Mauro Kaufmann, RN as Oncology Nurse Navigator Magrinat, Virgie Dad, MD as Consulting Physician (Oncology) Rolm Bookbinder, MD as Consulting Physician (General Surgery) Kyung Rudd, MD as Consulting Physician (Radiation Oncology) Marylynn Pearson, MD as Consulting Physician (Obstetrics and Gynecology) Chauncey Cruel, MD OTHER MD:   CHIEF COMPLAINT: Triple negative breast cancer  CURRENT TREATMENT: Adjuvant chemotherapy   INTERVAL HISTORY: Cassandra Sanders returns today for follow-up and treatment of her triple negative breast cancer. She was last seen here on 09/03/2018 by Wilber Bihari, PA.   She continues on adjuvant chemotherapy, and completed 4 cycles of dose dense doxorubicin and cyclophosphamide given every 14 days.Today is day 1 cycle 1 of carboplatin and paclitaxel, which she will receive weekly x12.  Since her last visit here, she has not undergone any additional studies.   REVIEW OF SYSTEMS: Cassandra Sanders had a little bit of a "emotional reaction" at this last week.  Possibly this has to do with her finishing the most intense portion of chemo.  What was bothering her particularly is the fact that she has lost all her bottom teeth.  She blames this on inadequate diet as a child and possibly also chemotherapy.  In any case she has had a full plate and the bottom and a partial on top and it does not quite fit.  She is able to chew and she is in the process of getting all this adjusted.  In addition her sense of taste of course is altered because of the chemotherapy.  Aside from these she is walking 15 to 30 minutes every day and of course she continues to work full-time, partly at home and partly the office.  A detailed  review of systems today was otherwise stable   HISTORY OF CURRENT ILLNESS: From the original intake note:  Cassandra Sanders had routine screening mammography on 05/15/2018 at [facility] showing a possible abnormality in the left breast. She underwent bilateral diagnostic mammography with tomography and left breast ultrasonography at [facility] on 06/02/2018 showing: breast density category B; suspicious approximate 0.9 cm mass involving the upper inner quadrant of the left breast at posterior depth which account for the screening mammographic finding; no pathologic left axillary lymphadenopathy.  Accordingly on 06/04/2018 she proceeded to biopsy of the left breast area in question. The pathology from this procedure ([id number]) showed: invasive ductal carcinoma, grade 3. Prognostic indicators significant for: estrogen receptor, 60% positive with moderate staining intensity and progesterone receptor, 0% negative. Proliferation marker Ki67 at 80%. HER2 negative by immunohistochemistry (0).  The patient's subsequent history is as detailed below.   PAST MEDICAL HISTORY: Past Medical History:  Diagnosis Date  . Breast cancer ([place]) 06/04/2018   left breast  . Diabetes mellitus without complication ([place])   . Hypertension     PAST SURGICAL HISTORY: Past Surgical History:  Procedure Laterality Date  . BREAST LUMPECTOMY WITH RADIOACTIVE SEED AND SENTINEL LYMPH NODE BIOPSY Left 06/26/2018   Procedure: LEFT BREAST LUMPECTOMY WITH RADIOACTIVE SEED AND LEFT AXILLARY SENTINEL LYMPH NODE BIOPSY;  Surgeon: Rolm Bookbinder, MD;  Location: [facility];  Service: General;  Laterality: Left;  . PORTACATH PLACEMENT Right 06/26/2018   Procedure: INSERTION PORT-A-CATH WITH ULTRASOUND;  Surgeon: Rolm Bookbinder, MD;  Location: [facility];  Service: General;  Laterality: Right;    FAMILY HISTORY: No family history on file. Patient's father was in his 53s when he  was murdered. Patient's mother is currently (as of 06/2018) at age 43. The patient denies a family hx of breast or ovarian cancer. She has 3 brothers and no biological sisters.   GYNECOLOGIC HISTORY:  Patient's last menstrual period was 06/18/2013. Menarche: 53 years old Age at first live birth: 53 years old [place] P 1 LMP June 2015 Contraceptive used from age [age] with no complications HRT none  Hysterectomy? no BSO? no   SOCIAL HISTORY: (updated 06/15/6293)  Cassandra Sanders is currently working as a Magazine features editor at the [employer]. She is engaged to [facility]. She lives at home with her son Cassandra Sanders, age 26, who works here in [place] as a Museum/gallery exhibitions officer.  The patient attends [employer].   ADVANCED DIRECTIVES: not in place; at the 06/10/2018 visit the patient was given the appropriate documents to complete and notarize at her discretion.  She intends to name her son is her healthcare power of attorney   HEALTH MAINTENANCE: Social History   Tobacco Use  . Smoking status: Never Smoker  . Smokeless tobacco: Never Used  Substance Use Topics  . Alcohol use: No  . Drug use: No     Colonoscopy: 2016  PAP: 05/15/2018, negative  Bone density: never done   No Known Allergies  Current Outpatient Medications  Medication Sig Dispense Refill  . acetaminophen-codeine (TYLENOL #3) 300-30 MG tablet     . aspirin EC 81 MG tablet Take 81 mg by mouth daily.    Marland Kitchen dexamethasone (DECADRON) 4 MG tablet Take 2 tablets by mouth once a day on the day after chemotherapy and then take 2 tablets two times a day for 2 days. Take with food. 30 tablet 1  . ibuprofen (ADVIL) 800 MG tablet     . JANUVIA 100 MG tablet daily.     Marland Kitchen lidocaine-prilocaine (EMLA) cream Apply to affected area once 30 g 3  . lisinopril (PRINIVIL,ZESTRIL) 10 MG tablet Take 10 mg by mouth daily.  2  . loratadine (CLARITIN) 10 MG tablet Take 1 tablet (10 mg total) by mouth daily. 90 tablet 1  .  LORazepam (ATIVAN) 0.5 MG tablet Take 1 tablet (0.5 mg total) by mouth at bedtime as needed and may repeat dose one time if needed (Nausea or vomiting). 30 tablet 0  . METFORMIN HCL PO Take 1,000 mg by mouth daily.     Marland Kitchen omeprazole (PRILOSEC) 40 MG capsule TAKE 1 CAPSULE BY MOUTH EVERY DAY 30 capsule 0  . oxyCODONE (OXY IR/ROXICODONE) 5 MG immediate release tablet Take 1 tablet (5 mg total) by mouth every 6 (six) hours as needed for moderate pain, severe pain or breakthrough pain. 12 tablet 0  . prochlorperazine (COMPAZINE) 10 MG tablet Take 1 tablet (10 mg total) by mouth every 6 (six) hours as needed (Nausea or vomiting). 30 tablet 1   No current facility-administered medications for this visit.     OBJECTIVE: Morbidly obese African-American woman in no acute distress  Vitals:   09/18/18 0914  BP: (!) 166/100  Pulse: 89  Resp: 17  Temp: 97.7 F (36.5 C)  SpO2: 100%   Wt Readings from Last 3 Encounters:  09/18/18 271 lb 3.2 oz (123 kg)  09/03/18 274 lb 4.8 oz (124.4 kg)  08/21/18 277 lb 9.6 oz (125.9 kg)  Body mass index is 40.05 kg/m.    ECOG FS:1 - Symptomatic but completely ambulatory  Ocular: Sclerae unicteric, pupils round and equal Ear-nose-throat: Wearing a mask Lymphatic: No cervical or supraclavicular adenopathy Lungs no rales or rhonchi Heart regular rate and rhythm Abd soft, obese, nontender, positive bowel sounds MSK no focal spinal tenderness, no joint edema Neuro: non-focal, well-oriented, appropriate affect Breasts: Deferred    LAB RESULTS:  CMP     Component Value Date/Time   NA 139 09/18/2018 0905   K 3.8 09/18/2018 0905   CL 105 09/18/2018 0905   CO2 25 09/18/2018 0905   GLUCOSE 187 (H) 09/18/2018 0905   BUN 7 09/18/2018 0905   CREATININE 0.83 09/18/2018 0905   CREATININE 1.14 (H) 06/10/2018 0859   CALCIUM 8.9 09/18/2018 0905   PROT 6.6 09/18/2018 0905   ALBUMIN 3.9 09/18/2018 0905   AST 12 (L) 09/18/2018 0905   AST 13 (L) 06/10/2018 0859    ALT 19 09/18/2018 0905   ALT 17 06/10/2018 0859   ALKPHOS 100 09/18/2018 0905   BILITOT 0.2 (L) 09/18/2018 0905   BILITOT 0.3 06/10/2018 0859   GFRNONAA >60 09/18/2018 0905   GFRNONAA 54 (L) 06/10/2018 0859   GFRAA >60 09/18/2018 0905   GFRAA >60 06/10/2018 0859    No results found for: TOTALPROTELP, ALBUMINELP, A1GS, A2GS, BETS, BETA2SER, GAMS, MSPIKE, SPEI  No results found for: KPAFRELGTCHN, LAMBDASER, KAPLAMBRATIO  Lab Results  Component Value Date   WBC 5.2 09/18/2018   NEUTROABS 3.7 09/18/2018   HGB 9.2 (L) 09/18/2018   HCT 28.1 (L) 09/18/2018   MCV 91.2 09/18/2018   PLT 268 09/18/2018    _0 @  No results found for: LABCA2  No components found for: YCXKGY185  No results for input(s): INR in the last 168 hours.  No results found for: LABCA2  No results found for: UDJ497  No results found for: WYO378  No results found for: HYI502  No results found for: CA2729  No components found for: HGQUANT  No results found for: CEA1 / No results found for: CEA1   No results found for: AFPTUMOR  No results found for: CHROMOGRNA  No results found for: PSA1  Appointment on 09/18/2018  Component Date Value Ref Range Status  . Sodium 09/18/2018 139  135 - 145 mmol/L Final  . Potassium 09/18/2018 3.8  3.5 - 5.1 mmol/L Final  . Chloride 09/18/2018 105  98 - 111 mmol/L Final  . CO2 09/18/2018 25  22 - 32 mmol/L Final  . Glucose, Bld 09/18/2018 187* 70 - 99 mg/dL Final  . BUN 09/18/2018 7  6 - 20 mg/dL Final  . Creatinine, Ser 09/18/2018 0.83  0.44 - 1.00 mg/dL Final  . Calcium 09/18/2018 8.9  8.9 - 10.3 mg/dL Final  . Total Protein 09/18/2018 6.6  6.5 - 8.1 g/dL Final  . Albumin 09/18/2018 3.9  3.5 - 5.0 g/dL Final  . AST 09/18/2018 12* 15 - 41 U/L Final  . ALT 09/18/2018 19  0 - 44 U/L Final  . Alkaline Phosphatase 09/18/2018 100  38 - 126 U/L Final  . Total Bilirubin 09/18/2018 0.2* 0.3 - 1.2 mg/dL Final  . GFR calc non Af Amer 09/18/2018 >60  >60  mL/min Final  . GFR calc Af Amer 09/18/2018 >60  >60 mL/min Final  . Anion gap 09/18/2018 9  5 - 15 Final   Performed at [facility], [place] [address] [place], [place] [address]  . WBC 09/18/2018  5.2  4.0 - 10.5 K/uL Final  . RBC 09/18/2018 3.08* 3.87 - 5.11 MIL/uL Final  . Hemoglobin 09/18/2018 9.2* 12.0 - 15.0 g/dL Final  . HCT 09/18/2018 28.1* 36.0 - 46.0 % Final  . MCV 09/18/2018 91.2  80.0 - 100.0 fL Final  . MCH 09/18/2018 29.9  26.0 - 34.0 pg Final  . MCHC 09/18/2018 32.7  30.0 - 36.0 g/dL Final  . RDW 09/18/2018 15.4  11.5 - 15.5 % Final  . Platelets 09/18/2018 268  150 - 400 K/uL Final  . nRBC 09/18/2018 0.6* 0.0 - 0.2 % Final  . Neutrophils Relative % 09/18/2018 71  % Final  . Neutro Abs 09/18/2018 3.7  1.7 - 7.7 K/uL Final  . Lymphocytes Relative 09/18/2018 8  % Final  . Lymphs Abs 09/18/2018 0.4* 0.7 - 4.0 K/uL Final  . Monocytes Relative 09/18/2018 14  % Final  . Monocytes Absolute 09/18/2018 0.7  0.1 - 1.0 K/uL Final  . Eosinophils Relative 09/18/2018 0  % Final  . Eosinophils Absolute 09/18/2018 0.0  0.0 - 0.5 K/uL Final  . Basophils Relative 09/18/2018 0  % Final  . Basophils Absolute 09/18/2018 0.0  0.0 - 0.1 K/uL Final  . Immature Granulocytes 09/18/2018 7  % Final   Increased IG's, likely caused by Bone Marrow Colony Stimulating Factor received within 30 days.  . Abs Immature Granulocytes 09/18/2018 0.35* 0.00 - 0.07 K/uL Final  . Polychromasia 09/18/2018 PRESENT   Final   Performed at [facility], [place] [address], [place], [place] [address]    (this displays the last labs from the last 3 days)  No results found for: TOTALPROTELP, ALBUMINELP, A1GS, A2GS, BETS, BETA2SER, GAMS, MSPIKE, SPEI (this displays SPEP labs)  No results found for: KPAFRELGTCHN, LAMBDASER, KAPLAMBRATIO (kappa/lambda light chains)  No results found for: HGBA, HGBA2QUANT, HGBFQUANT, HGBSQUAN (Hemoglobinopathy evaluation)   No results  found for: LDH  No results found for: IRON, TIBC, IRONPCTSAT (Iron and TIBC)  No results found for: FERRITIN  Urinalysis No results found for: COLORURINE, APPEARANCEUR, LABSPEC, PHURINE, GLUCOSEU, HGBUR, BILIRUBINUR, KETONESUR, PROTEINUR, UROBILINOGEN, NITRITE, LEUKOCYTESUR   STUDIES: No results found.  ELIGIBLE FOR AVAILABLE RESEARCH PROTOCOL: no  ASSESSMENT: 53 y.o. [place] woman status post left breast upper inner quadrant biopsy 06/04/2018 for a clinical T1b N0, stage IB invasive ductal carcinoma, grade 3, estrogen receptor moderately positive, progesterone receptor negative, with an MIB-1 of 80%, and HER-2 nonamplified  (1) left lumpectomy and sentinel lymph node sampling 06/26/2018 showed a pT1c pN0, stage IB invasive ductal carcinoma, grade 3, with negative margins  (a) repeat prognostic panel triple negative  (b) a total of 3 sentinel lymph nodes were removed  (2) Oncotype score of 56 obtained from the 06/04/2018 biopsy predicts a risk of outside the breast recurrence over the next 9 years of nearly 40% if the patient's only systemic therapy is antiestrogens for 5 years.  It also predicts a significant benefit from chemotherapy.  (a) Oncotype reads the tumor also as triple negative.  (3) adjuvant chemo therapy will consist of cyclophosphamide and doxorubicin in dose dense fashion x4 starting 07/17/2018, followed by weekly paclitaxel and carboplatin x12  (a) echo on 07/16/2018 shows EF of 60-65%  (4) adjuvant radiation to follow chemotherapy  PLAN: Aleiyah did remarkably well with the first, more intensive portion of her chemo and she is now ready to start the weekly paclitaxel/carboplatin.  She is aware of the possible toxicities side effects and complications and we reviewed these  again today.  For nausea all she will do is take Compazine the evening of chemo and the next morning.  After that she will take it only as needed.  If that does not work well for her we will change  to Zofran or add it.  I have encouraged her to continue her exercise program and extended if possible.  I suggested that she keep a symptom diary since that will facilitate our troubleshooting any problems that may develop from this particular chemotherapy when we see her again a week from now  Otherwise she knows to call for any other issue that may develop before the next visit.  Magrinat, Virgie Dad, MD  09/18/18 9:47 AM Medical Oncology and Hematology [facility] [address] [place], [place] [address] Tel. [phone]    Fax. [phone]  I, Jacqualyn Posey am acting as a Education administrator for Chauncey Cruel, MD.   I, Lurline Del MD, have reviewed the above documentation for accuracy and completeness, and I agree with the above.

## 2018-09-18 ENCOUNTER — Other Ambulatory Visit: Payer: Self-pay

## 2018-09-18 ENCOUNTER — Other Ambulatory Visit: Payer: Federal, State, Local not specified - PPO

## 2018-09-18 ENCOUNTER — Inpatient Hospital Stay: Payer: Federal, State, Local not specified - PPO

## 2018-09-18 ENCOUNTER — Inpatient Hospital Stay: Payer: Federal, State, Local not specified - PPO | Admitting: Oncology

## 2018-09-18 ENCOUNTER — Ambulatory Visit: Payer: Federal, State, Local not specified - PPO

## 2018-09-18 ENCOUNTER — Inpatient Hospital Stay: Payer: Federal, State, Local not specified - PPO | Attending: Oncology

## 2018-09-18 ENCOUNTER — Inpatient Hospital Stay (HOSPITAL_BASED_OUTPATIENT_CLINIC_OR_DEPARTMENT_OTHER): Payer: Federal, State, Local not specified - PPO | Admitting: Medical

## 2018-09-18 VITALS — BP 168/101 | HR 106 | Temp 98.4°F | Resp 18

## 2018-09-18 VITALS — BP 166/100 | HR 89 | Temp 97.7°F | Resp 17 | Ht 69.0 in | Wt 271.2 lb

## 2018-09-18 DIAGNOSIS — Z791 Long term (current) use of non-steroidal anti-inflammatories (NSAID): Secondary | ICD-10-CM | POA: Diagnosis not present

## 2018-09-18 DIAGNOSIS — T451X5A Adverse effect of antineoplastic and immunosuppressive drugs, initial encounter: Secondary | ICD-10-CM | POA: Insufficient documentation

## 2018-09-18 DIAGNOSIS — E119 Type 2 diabetes mellitus without complications: Secondary | ICD-10-CM | POA: Diagnosis not present

## 2018-09-18 DIAGNOSIS — C50212 Malignant neoplasm of upper-inner quadrant of left female breast: Secondary | ICD-10-CM

## 2018-09-18 DIAGNOSIS — Z171 Estrogen receptor negative status [ER-]: Secondary | ICD-10-CM | POA: Diagnosis not present

## 2018-09-18 DIAGNOSIS — I10 Essential (primary) hypertension: Secondary | ICD-10-CM | POA: Diagnosis not present

## 2018-09-18 DIAGNOSIS — Z170 Estrogen receptor positive status [ER+]: Secondary | ICD-10-CM

## 2018-09-18 DIAGNOSIS — Z7984 Long term (current) use of oral hypoglycemic drugs: Secondary | ICD-10-CM | POA: Diagnosis not present

## 2018-09-18 DIAGNOSIS — Z5111 Encounter for antineoplastic chemotherapy: Secondary | ICD-10-CM | POA: Insufficient documentation

## 2018-09-18 DIAGNOSIS — Z95828 Presence of other vascular implants and grafts: Secondary | ICD-10-CM

## 2018-09-18 DIAGNOSIS — R000 Tachycardia, unspecified: Secondary | ICD-10-CM | POA: Diagnosis not present

## 2018-09-18 DIAGNOSIS — Z7982 Long term (current) use of aspirin: Secondary | ICD-10-CM | POA: Insufficient documentation

## 2018-09-18 DIAGNOSIS — R0602 Shortness of breath: Secondary | ICD-10-CM | POA: Diagnosis not present

## 2018-09-18 DIAGNOSIS — Z6841 Body Mass Index (BMI) 40.0 and over, adult: Secondary | ICD-10-CM

## 2018-09-18 DIAGNOSIS — Z79899 Other long term (current) drug therapy: Secondary | ICD-10-CM | POA: Insufficient documentation

## 2018-09-18 DIAGNOSIS — T8090XA Unspecified complication following infusion and therapeutic injection, initial encounter: Secondary | ICD-10-CM

## 2018-09-18 LAB — CBC WITH DIFFERENTIAL/PLATELET
Abs Immature Granulocytes: 0.35 10*3/uL — ABNORMAL HIGH (ref 0.00–0.07)
Basophils Absolute: 0 10*3/uL (ref 0.0–0.1)
Basophils Relative: 0 %
Eosinophils Absolute: 0 10*3/uL (ref 0.0–0.5)
Eosinophils Relative: 0 %
HCT: 28.1 % — ABNORMAL LOW (ref 36.0–46.0)
Hemoglobin: 9.2 g/dL — ABNORMAL LOW (ref 12.0–15.0)
Immature Granulocytes: 7 %
Lymphocytes Relative: 8 %
Lymphs Abs: 0.4 10*3/uL — ABNORMAL LOW (ref 0.7–4.0)
MCH: 29.9 pg (ref 26.0–34.0)
MCHC: 32.7 g/dL (ref 30.0–36.0)
MCV: 91.2 fL (ref 80.0–100.0)
Monocytes Absolute: 0.7 10*3/uL (ref 0.1–1.0)
Monocytes Relative: 14 %
Neutro Abs: 3.7 10*3/uL (ref 1.7–7.7)
Neutrophils Relative %: 71 %
Platelets: 268 10*3/uL (ref 150–400)
RBC: 3.08 MIL/uL — ABNORMAL LOW (ref 3.87–5.11)
RDW: 15.4 % (ref 11.5–15.5)
WBC: 5.2 10*3/uL (ref 4.0–10.5)
nRBC: 0.6 % — ABNORMAL HIGH (ref 0.0–0.2)

## 2018-09-18 LAB — COMPREHENSIVE METABOLIC PANEL
ALT: 19 U/L (ref 0–44)
AST: 12 U/L — ABNORMAL LOW (ref 15–41)
Albumin: 3.9 g/dL (ref 3.5–5.0)
Alkaline Phosphatase: 100 U/L (ref 38–126)
Anion gap: 9 (ref 5–15)
BUN: 7 mg/dL (ref 6–20)
CO2: 25 mmol/L (ref 22–32)
Calcium: 8.9 mg/dL (ref 8.9–10.3)
Chloride: 105 mmol/L (ref 98–111)
Creatinine, Ser: 0.83 mg/dL (ref 0.44–1.00)
GFR calc Af Amer: >60 mL/min (ref >60)
GFR calc non Af Amer: >60 mL/min (ref >60)
Glucose, Bld: 187 mg/dL — ABNORMAL HIGH (ref 70–99)
Potassium: 3.8 mmol/L (ref 3.5–5.1)
Sodium: 139 mmol/L (ref 135–145)
Total Bilirubin: 0.2 mg/dL — ABNORMAL LOW (ref 0.3–1.2)
Total Protein: 6.6 g/dL (ref 6.5–8.1)

## 2018-09-18 MED ORDER — HEPARIN SOD (PORK) LOCK FLUSH 100 UNIT/ML IV SOLN
500.0000 [IU] | Freq: Once | INTRAVENOUS | Status: AC | PRN
  Administered 2018-09-18: 500 [IU]
  Filled 2018-09-18: qty 5

## 2018-09-18 MED ORDER — PALONOSETRON HCL INJECTION 0.25 MG/5ML
INTRAVENOUS | Status: AC
  Filled 2018-09-18: qty 5

## 2018-09-18 MED ORDER — SODIUM CHLORIDE 0.9% FLUSH
10.0000 mL | INTRAVENOUS | Status: DC | PRN
  Administered 2018-09-18: 16:00:00 10 mL
  Filled 2018-09-18: qty 10

## 2018-09-18 MED ORDER — DIPHENHYDRAMINE HCL 50 MG/ML IJ SOLN
25.0000 mg | Freq: Once | INTRAMUSCULAR | Status: AC
  Administered 2018-09-18: 25 mg via INTRAVENOUS

## 2018-09-18 MED ORDER — FAMOTIDINE IN NACL 20-0.9 MG/50ML-% IV SOLN
INTRAVENOUS | Status: AC
  Filled 2018-09-18: qty 50

## 2018-09-18 MED ORDER — FAMOTIDINE IN NACL 20-0.9 MG/50ML-% IV SOLN
20.0000 mg | Freq: Once | INTRAVENOUS | Status: AC
  Administered 2018-09-18: 20 mg via INTRAVENOUS

## 2018-09-18 MED ORDER — FAMOTIDINE IN NACL 20-0.9 MG/50ML-% IV SOLN
20.0000 mg | Freq: Once | INTRAVENOUS | Status: AC | PRN
  Administered 2018-09-18: 20 mg via INTRAVENOUS

## 2018-09-18 MED ORDER — SODIUM CHLORIDE 0.9 % IV SOLN
Freq: Once | INTRAVENOUS | Status: AC
  Administered 2018-09-18: 11:00:00 via INTRAVENOUS
  Filled 2018-09-18: qty 5

## 2018-09-18 MED ORDER — SODIUM CHLORIDE 0.9 % IV SOLN
Freq: Once | INTRAVENOUS | Status: AC
  Administered 2018-09-18: 10:00:00 via INTRAVENOUS
  Filled 2018-09-18: qty 250

## 2018-09-18 MED ORDER — DIPHENHYDRAMINE HCL 50 MG/ML IJ SOLN
25.0000 mg | Freq: Once | INTRAMUSCULAR | Status: AC | PRN
  Administered 2018-09-18: 25 mg via INTRAVENOUS

## 2018-09-18 MED ORDER — SODIUM CHLORIDE 0.9 % IV SOLN
Freq: Once | INTRAVENOUS | Status: DC | PRN
  Administered 2018-09-18: 13:00:00 via INTRAVENOUS
  Filled 2018-09-18: qty 250

## 2018-09-18 MED ORDER — SODIUM CHLORIDE 0.9% FLUSH
10.0000 mL | Freq: Once | INTRAVENOUS | Status: AC
  Administered 2018-09-18: 10 mL
  Filled 2018-09-18: qty 10

## 2018-09-18 MED ORDER — SODIUM CHLORIDE 0.9 % IV SOLN
300.0000 mg | Freq: Once | INTRAVENOUS | Status: AC
  Administered 2018-09-18: 300 mg via INTRAVENOUS
  Filled 2018-09-18: qty 30

## 2018-09-18 MED ORDER — SODIUM CHLORIDE 0.9 % IV SOLN
80.0000 mg/m2 | Freq: Once | INTRAVENOUS | Status: AC
  Administered 2018-09-18: 204 mg via INTRAVENOUS
  Filled 2018-09-18: qty 34

## 2018-09-18 MED ORDER — DIPHENHYDRAMINE HCL 50 MG/ML IJ SOLN
INTRAMUSCULAR | Status: AC
  Filled 2018-09-18: qty 1

## 2018-09-18 MED ORDER — PALONOSETRON HCL INJECTION 0.25 MG/5ML
0.2500 mg | Freq: Once | INTRAVENOUS | Status: AC
  Administered 2018-09-18: 10:00:00 0.25 mg via INTRAVENOUS

## 2018-09-18 NOTE — Progress Notes (Signed)
   DATE:  09/18/2018                                          X  CHEMO/IMMUNOTHERAPY REACTION            MD:  Dr. Gunnar Bulla Magrinat    AGENT/BLOOD PRODUCT RECEIVING TODAY:               Carboplatin and paclitaxel   AGENT/BLOOD PRODUCT RECEIVING IMMEDIATELY PRIOR TO REACTION:           Paclitaxel   VS: BP:      143/92   P:        106       SPO2:        100% on 2 L via nasal cannula                BP:      155/103   P:        108       SPO2:        100% on 2 L via nasal cannula     REACTION(S):            Shortness of breath, elevated blood pressure, and tachycardia   PREMEDS:      Aloxi, Benadryl 25 mg IV, Emend, Pepcid   INTERVENTION: Paclitaxel was caused and the patient was given Pepcid 20 mg IV x1 and Benadryl 25 mg IV x1   Review of Systems  Review of Systems  Constitutional: Negative for chills, diaphoresis and fever.  HENT: Negative for trouble swallowing and voice change.   Respiratory: Positive for shortness of breath. Negative for cough, chest tightness and wheezing.   Cardiovascular: Negative for chest pain and palpitations.  Gastrointestinal: Negative for abdominal pain, constipation, diarrhea, nausea and vomiting.  Musculoskeletal: Negative for back pain and myalgias.  Neurological: Negative for dizziness, light-headedness and headaches.     Physical Exam  Physical Exam Constitutional:      General: She is not in acute distress.    Appearance: She is not diaphoretic.  HENT:     Head: Normocephalic and atraumatic.  Cardiovascular:     Rate and Rhythm: Regular rhythm. Tachycardia present.     Heart sounds: Normal heart sounds. No murmur. No friction rub. No gallop.   Pulmonary:     Effort: Pulmonary effort is normal. No respiratory distress.     Breath sounds: Normal breath sounds. No wheezing or rales.  Skin:    General: Skin is warm and dry.     Findings: No erythema or rash.  Neurological:     Mental Status: She is alert.  Psychiatric:        Mood and  Affect: Mood normal.        Behavior: Behavior normal.        Thought Content: Thought content normal.        Judgment: Judgment normal.     OUTCOME:                The patient's symptoms are resolved.  Paclitaxel was restarted.  The patient was able to complete her infusion without any additional issues of concern.   Sandi Mealy, MHS, PA-C

## 2018-09-18 NOTE — Patient Instructions (Signed)
[facility] Discharge Instructions for Patients Receiving Chemotherapy  Today you received the following chemotherapy agents Taxol and Carboplatin  To help prevent nausea and vomiting after your treatment, we encourage you to take your nausea medication as prescribed.   If you develop nausea and vomiting that is not controlled by your nausea medication, call the clinic.   BELOW ARE SYMPTOMS THAT SHOULD BE REPORTED IMMEDIATELY:  *FEVER GREATER THAN 100.5 F  *CHILLS WITH OR WITHOUT FEVER  NAUSEA AND VOMITING THAT IS NOT CONTROLLED WITH YOUR NAUSEA MEDICATION  *UNUSUAL SHORTNESS OF BREATH  *UNUSUAL BRUISING OR BLEEDING  TENDERNESS IN MOUTH AND THROAT WITH OR WITHOUT PRESENCE OF ULCERS  *URINARY PROBLEMS  *BOWEL PROBLEMS  UNUSUAL RASH Items with * indicate a potential emergency and should be followed up as soon as possible.  Feel free to call the clinic should you have any questions or concerns. The clinic phone number is [phone].  Please show the CHEMO ALERT CARD at check-in to the Emergency Department and triage nurse.     Paclitaxel injection What is this medicine? PACLITAXEL (PAK li TAX el) is a chemotherapy drug. It targets fast dividing cells, like cancer cells, and causes these cells to die. This medicine is used to treat ovarian cancer, breast cancer, lung cancer, Kaposi's sarcoma, and other cancers. This medicine may be used for other purposes; ask your health care provider or pharmacist if you have questions. COMMON BRAND NAME(S): Onxol, Taxol What should I tell my health care provider before I take this medicine? They need to know if you have any of these conditions:  history of irregular heartbeat  liver disease  low blood counts, like low white cell, platelet, or red cell counts  lung or breathing disease, like asthma  tingling of the fingers or toes, or other nerve disorder  an unusual or allergic reaction to paclitaxel, alcohol,  polyoxyethylated castor oil, other chemotherapy, other medicines, foods, dyes, or preservatives  pregnant or trying to get pregnant  breast-feeding How should I use this medicine? This drug is given as an infusion into a vein. It is administered in a hospital or clinic by a specially trained health care professional. Talk to your pediatrician regarding the use of this medicine in children. Special care may be needed. Overdosage: If you think you have taken too much of this medicine contact a poison control center or emergency room at once. NOTE: This medicine is only for you. Do not share this medicine with others. What if I miss a dose? It is important not to miss your dose. Call your doctor or health care professional if you are unable to keep an appointment. What may interact with this medicine? Do not take this medicine with any of the following medications:  disulfiram  metronidazole This medicine may also interact with the following medications:  antiviral medicines for hepatitis, HIV or AIDS  certain antibiotics like erythromycin and clarithromycin  certain medicines for fungal infections like ketoconazole and itraconazole  certain medicines for seizures like carbamazepine, phenobarbital, phenytoin  gemfibrozil  nefazodone  rifampin  St. John's wort This list may not describe all possible interactions. Give your health care provider a list of all the medicines, herbs, non-prescription drugs, or dietary supplements you use. Also tell them if you smoke, drink alcohol, or use illegal drugs. Some items may interact with your medicine. What should I watch for while using this medicine? Your condition will be monitored carefully while you are receiving this medicine. You   will need important blood work done while you are taking this medicine. This medicine can cause serious allergic reactions. To reduce your risk you will need to take other medicine(s) before treatment with this  medicine. If you experience allergic reactions like skin rash, itching or hives, swelling of the face, lips, or tongue, tell your doctor or health care professional right away. In some cases, you may be given additional medicines to help with side effects. Follow all directions for their use. This drug may make you feel generally unwell. This is not uncommon, as chemotherapy can affect healthy cells as well as cancer cells. Report any side effects. Continue your course of treatment even though you feel ill unless your doctor tells you to stop. Call your doctor or health care professional for advice if you get a fever, chills or sore throat, or other symptoms of a cold or flu. Do not treat yourself. This drug decreases your body's ability to fight infections. Try to avoid being around people who are sick. This medicine may increase your risk to bruise or bleed. Call your doctor or health care professional if you notice any unusual bleeding. Be careful brushing and flossing your teeth or using a toothpick because you may get an infection or bleed more easily. If you have any dental work done, tell your dentist you are receiving this medicine. Avoid taking products that contain aspirin, acetaminophen, ibuprofen, naproxen, or ketoprofen unless instructed by your doctor. These medicines may hide a fever. Do not become pregnant while taking this medicine. Women should inform their doctor if they wish to become pregnant or think they might be pregnant. There is a potential for serious side effects to an unborn child. Talk to your health care professional or pharmacist for more information. Do not breast-feed an infant while taking this medicine. Men are advised not to father a child while receiving this medicine. This product may contain alcohol. Ask your pharmacist or healthcare provider if this medicine contains alcohol. Be sure to tell all healthcare providers you are taking this medicine. Certain medicines,  like metronidazole and disulfiram, can cause an unpleasant reaction when taken with alcohol. The reaction includes flushing, headache, nausea, vomiting, sweating, and increased thirst. The reaction can last from 30 minutes to several hours. What side effects may I notice from receiving this medicine? Side effects that you should report to your doctor or health care professional as soon as possible:  allergic reactions like skin rash, itching or hives, swelling of the face, lips, or tongue  breathing problems  changes in vision  fast, irregular heartbeat  high or low blood pressure  mouth sores  pain, tingling, numbness in the hands or feet  signs of decreased platelets or bleeding - bruising, pinpoint red spots on the skin, black, tarry stools, blood in the urine  signs of decreased red blood cells - unusually weak or tired, feeling faint or lightheaded, falls  signs of infection - fever or chills, cough, sore throat, pain or difficulty passing urine  signs and symptoms of liver injury like dark yellow or brown urine; general ill feeling or flu-like symptoms; light-colored stools; loss of appetite; nausea; right upper belly pain; unusually weak or tired; yellowing of the eyes or skin  swelling of the ankles, feet, hands  unusually slow heartbeat Side effects that usually do not require medical attention (report to your doctor or health care professional if they continue or are bothersome):  diarrhea  hair loss  loss of appetite    muscle or joint pain  nausea, vomiting  pain, redness, or irritation at site where injected  tiredness This list may not describe all possible side effects. Call your doctor for medical advice about side effects. You may report side effects to FDA at 1-800-FDA-1088. Where should I keep my medicine? This drug is given in a hospital or clinic and will not be stored at home. NOTE: This sheet is a summary. It may not cover all possible information.  If you have questions about this medicine, talk to your doctor, pharmacist, or health care provider.  2020 Elsevier/Gold Standard (2016-08-27 13:14:55)  Carboplatin injection What is this medicine? CARBOPLATIN (KAR boe pla tin) is a chemotherapy drug. It targets fast dividing cells, like cancer cells, and causes these cells to die. This medicine is used to treat ovarian cancer and many other cancers. This medicine may be used for other purposes; ask your health care provider or pharmacist if you have questions. COMMON BRAND NAME(S): Paraplatin What should I tell my health care provider before I take this medicine? They need to know if you have any of these conditions:  blood disorders  hearing problems  kidney disease  recent or ongoing radiation therapy  an unusual or allergic reaction to carboplatin, cisplatin, other chemotherapy, other medicines, foods, dyes, or preservatives  pregnant or trying to get pregnant  breast-feeding How should I use this medicine? This drug is usually given as an infusion into a vein. It is administered in a hospital or clinic by a specially trained health care professional. Talk to your pediatrician regarding the use of this medicine in children. Special care may be needed. Overdosage: If you think you have taken too much of this medicine contact a poison control center or emergency room at once. NOTE: This medicine is only for you. Do not share this medicine with others. What if I miss a dose? It is important not to miss a dose. Call your doctor or health care professional if you are unable to keep an appointment. What may interact with this medicine?  medicines for seizures  medicines to increase blood counts like filgrastim, pegfilgrastim, sargramostim  some antibiotics like amikacin, gentamicin, neomycin, streptomycin, tobramycin  vaccines Talk to your doctor or health care professional before taking any of these  medicines:  acetaminophen  aspirin  ibuprofen  ketoprofen  naproxen This list may not describe all possible interactions. Give your health care provider a list of all the medicines, herbs, non-prescription drugs, or dietary supplements you use. Also tell them if you smoke, drink alcohol, or use illegal drugs. Some items may interact with your medicine. What should I watch for while using this medicine? Your condition will be monitored carefully while you are receiving this medicine. You will need important blood work done while you are taking this medicine. This drug may make you feel generally unwell. This is not uncommon, as chemotherapy can affect healthy cells as well as cancer cells. Report any side effects. Continue your course of treatment even though you feel ill unless your doctor tells you to stop. In some cases, you may be given additional medicines to help with side effects. Follow all directions for their use. Call your doctor or health care professional for advice if you get a fever, chills or sore throat, or other symptoms of a cold or flu. Do not treat yourself. This drug decreases your body's ability to fight infections. Try to avoid being around people who are sick. This medicine may increase your   risk to bruise or bleed. Call your doctor or health care professional if you notice any unusual bleeding. Be careful brushing and flossing your teeth or using a toothpick because you may get an infection or bleed more easily. If you have any dental work done, tell your dentist you are receiving this medicine. Avoid taking products that contain aspirin, acetaminophen, ibuprofen, naproxen, or ketoprofen unless instructed by your doctor. These medicines may hide a fever. Do not become pregnant while taking this medicine. Women should inform their doctor if they wish to become pregnant or think they might be pregnant. There is a potential for serious side effects to an unborn child. Talk  to your health care professional or pharmacist for more information. Do not breast-feed an infant while taking this medicine. What side effects may I notice from receiving this medicine? Side effects that you should report to your doctor or health care professional as soon as possible:  allergic reactions like skin rash, itching or hives, swelling of the face, lips, or tongue  signs of infection - fever or chills, cough, sore throat, pain or difficulty passing urine  signs of decreased platelets or bleeding - bruising, pinpoint red spots on the skin, black, tarry stools, nosebleeds  signs of decreased red blood cells - unusually weak or tired, fainting spells, lightheadedness  breathing problems  changes in hearing  changes in vision  chest pain  high blood pressure  low blood counts - This drug may decrease the number of white blood cells, red blood cells and platelets. You may be at increased risk for infections and bleeding.  nausea and vomiting  pain, swelling, redness or irritation at the injection site  pain, tingling, numbness in the hands or feet  problems with balance, talking, walking  trouble passing urine or change in the amount of urine Side effects that usually do not require medical attention (report to your doctor or health care professional if they continue or are bothersome):  hair loss  loss of appetite  metallic taste in the mouth or changes in taste This list may not describe all possible side effects. Call your doctor for medical advice about side effects. You may report side effects to FDA at 1-800-FDA-1088. Where should I keep my medicine? This drug is given in a hospital or clinic and will not be stored at home. NOTE: This sheet is a summary. It may not cover all possible information. If you have questions about this medicine, talk to your doctor, pharmacist, or health care provider.  2020 Elsevier/Gold Standard (2007-03-31 14:38:05)     

## 2018-09-18 NOTE — Progress Notes (Signed)
1305: During Taxol infusion, patient started complaining of SOB. Infusion stopped, oxygen via [place] administered at 2L, NS started at 94ml/hr. Lucianne Lei, PA [facility] at chairside. Pepcid 20mg  IVPB and Benadryl 25mg  injection administered. Vitals obtained and charted in flowsheet.  1337: patient verbalized feeling relieved.  1340: Report given to primary nurse at when she got back from lunch.

## 2018-09-18 NOTE — Patient Instructions (Signed)

## 2018-09-18 NOTE — Progress Notes (Signed)
[place] handoff from [place]. Pt  Denies any further shortness of breath and states that she feels "normal". Sandi Mealy PA updated and v.o. order received to restart Taxol at 142cc/hr. O2 d/c'd  Pt completed duration of the Taxol infusion with out incident.

## 2018-09-21 ENCOUNTER — Telehealth: Payer: Self-pay | Admitting: *Deleted

## 2018-09-25 ENCOUNTER — Inpatient Hospital Stay: Payer: Federal, State, Local not specified - PPO

## 2018-09-25 ENCOUNTER — Other Ambulatory Visit: Payer: Self-pay

## 2018-09-25 ENCOUNTER — Other Ambulatory Visit: Payer: Self-pay | Admitting: Oncology

## 2018-09-25 ENCOUNTER — Inpatient Hospital Stay (HOSPITAL_BASED_OUTPATIENT_CLINIC_OR_DEPARTMENT_OTHER): Payer: Federal, State, Local not specified - PPO | Admitting: Physician Assistant

## 2018-09-25 VITALS — BP 145/92 | HR 115 | Temp 98.8°F | Resp 17

## 2018-09-25 VITALS — BP 142/90 | HR 100 | Temp 97.3°F | Resp 18 | Ht 69.0 in | Wt 269.9 lb

## 2018-09-25 DIAGNOSIS — Z95828 Presence of other vascular implants and grafts: Secondary | ICD-10-CM

## 2018-09-25 DIAGNOSIS — C50212 Malignant neoplasm of upper-inner quadrant of left female breast: Secondary | ICD-10-CM | POA: Diagnosis not present

## 2018-09-25 DIAGNOSIS — Z791 Long term (current) use of non-steroidal anti-inflammatories (NSAID): Secondary | ICD-10-CM | POA: Diagnosis not present

## 2018-09-25 DIAGNOSIS — Z79899 Other long term (current) drug therapy: Secondary | ICD-10-CM | POA: Diagnosis not present

## 2018-09-25 DIAGNOSIS — Z7982 Long term (current) use of aspirin: Secondary | ICD-10-CM | POA: Diagnosis not present

## 2018-09-25 DIAGNOSIS — I10 Essential (primary) hypertension: Secondary | ICD-10-CM | POA: Diagnosis not present

## 2018-09-25 DIAGNOSIS — Z7984 Long term (current) use of oral hypoglycemic drugs: Secondary | ICD-10-CM | POA: Diagnosis not present

## 2018-09-25 DIAGNOSIS — Z170 Estrogen receptor positive status [ER+]: Secondary | ICD-10-CM

## 2018-09-25 DIAGNOSIS — Z171 Estrogen receptor negative status [ER-]: Secondary | ICD-10-CM | POA: Diagnosis not present

## 2018-09-25 DIAGNOSIS — R0602 Shortness of breath: Secondary | ICD-10-CM | POA: Diagnosis not present

## 2018-09-25 DIAGNOSIS — Z5111 Encounter for antineoplastic chemotherapy: Secondary | ICD-10-CM | POA: Diagnosis not present

## 2018-09-25 DIAGNOSIS — R000 Tachycardia, unspecified: Secondary | ICD-10-CM | POA: Diagnosis not present

## 2018-09-25 DIAGNOSIS — T451X5A Adverse effect of antineoplastic and immunosuppressive drugs, initial encounter: Secondary | ICD-10-CM | POA: Diagnosis not present

## 2018-09-25 DIAGNOSIS — E119 Type 2 diabetes mellitus without complications: Secondary | ICD-10-CM | POA: Diagnosis not present

## 2018-09-25 LAB — COMPREHENSIVE METABOLIC PANEL
ALT: 21 U/L (ref 0–44)
AST: 13 U/L — ABNORMAL LOW (ref 15–41)
Albumin: 4 g/dL (ref 3.5–5.0)
Alkaline Phosphatase: 82 U/L (ref 38–126)
Anion gap: 10 (ref 5–15)
BUN: 10 mg/dL (ref 6–20)
CO2: 25 mmol/L (ref 22–32)
Calcium: 9.3 mg/dL (ref 8.9–10.3)
Chloride: 105 mmol/L (ref 98–111)
Creatinine, Ser: 0.84 mg/dL (ref 0.44–1.00)
GFR calc Af Amer: >60 mL/min (ref >60)
GFR calc non Af Amer: >60 mL/min (ref >60)
Glucose, Bld: 182 mg/dL — ABNORMAL HIGH (ref 70–99)
Potassium: 3.9 mmol/L (ref 3.5–5.1)
Sodium: 140 mmol/L (ref 135–145)
Total Bilirubin: 0.3 mg/dL (ref 0.3–1.2)
Total Protein: 6.8 g/dL (ref 6.5–8.1)

## 2018-09-25 LAB — CBC WITH DIFFERENTIAL/PLATELET
Abs Immature Granulocytes: 0.03 10*3/uL (ref 0.00–0.07)
Basophils Absolute: 0 10*3/uL (ref 0.0–0.1)
Basophils Relative: 1 %
Eosinophils Absolute: 0 10*3/uL (ref 0.0–0.5)
Eosinophils Relative: 0 %
HCT: 27.4 % — ABNORMAL LOW (ref 36.0–46.0)
Hemoglobin: 9 g/dL — ABNORMAL LOW (ref 12.0–15.0)
Immature Granulocytes: 1 %
Lymphocytes Relative: 10 %
Lymphs Abs: 0.4 10*3/uL — ABNORMAL LOW (ref 0.7–4.0)
MCH: 29.9 pg (ref 26.0–34.0)
MCHC: 32.8 g/dL (ref 30.0–36.0)
MCV: 91 fL (ref 80.0–100.0)
Monocytes Absolute: 0.4 10*3/uL (ref 0.1–1.0)
Monocytes Relative: 10 %
Neutro Abs: 3.4 10*3/uL (ref 1.7–7.7)
Neutrophils Relative %: 78 %
Platelets: 387 10*3/uL (ref 150–400)
RBC: 3.01 MIL/uL — ABNORMAL LOW (ref 3.87–5.11)
RDW: 15.9 % — ABNORMAL HIGH (ref 11.5–15.5)
WBC: 4.3 10*3/uL (ref 4.0–10.5)
nRBC: 0 % (ref 0.0–0.2)

## 2018-09-25 MED ORDER — DIPHENHYDRAMINE HCL 50 MG/ML IJ SOLN
INTRAMUSCULAR | Status: AC
  Filled 2018-09-25: qty 1

## 2018-09-25 MED ORDER — SODIUM CHLORIDE 0.9 % IV SOLN
Freq: Once | INTRAVENOUS | Status: AC
  Administered 2018-09-25: 09:00:00 via INTRAVENOUS
  Filled 2018-09-25: qty 250

## 2018-09-25 MED ORDER — SODIUM CHLORIDE 0.9 % IV SOLN
Freq: Once | INTRAVENOUS | Status: AC
  Administered 2018-09-25: 10:00:00 via INTRAVENOUS
  Filled 2018-09-25: qty 5

## 2018-09-25 MED ORDER — SODIUM CHLORIDE 0.9% FLUSH
10.0000 mL | INTRAVENOUS | Status: DC | PRN
  Administered 2018-09-25: 10 mL
  Filled 2018-09-25: qty 10

## 2018-09-25 MED ORDER — PALONOSETRON HCL INJECTION 0.25 MG/5ML
0.2500 mg | Freq: Once | INTRAVENOUS | Status: AC
  Administered 2018-09-25: 0.25 mg via INTRAVENOUS

## 2018-09-25 MED ORDER — SODIUM CHLORIDE 0.9 % IV SOLN
300.0000 mg | Freq: Once | INTRAVENOUS | Status: AC
  Administered 2018-09-25: 300 mg via INTRAVENOUS
  Filled 2018-09-25: qty 30

## 2018-09-25 MED ORDER — DIPHENHYDRAMINE HCL 50 MG/ML IJ SOLN
25.0000 mg | Freq: Once | INTRAMUSCULAR | Status: AC
  Administered 2018-09-25: 25 mg via INTRAVENOUS

## 2018-09-25 MED ORDER — FAMOTIDINE IN NACL 20-0.9 MG/50ML-% IV SOLN
20.0000 mg | Freq: Once | INTRAVENOUS | Status: AC
  Administered 2018-09-25: 20 mg via INTRAVENOUS

## 2018-09-25 MED ORDER — HEPARIN SOD (PORK) LOCK FLUSH 100 UNIT/ML IV SOLN
500.0000 [IU] | Freq: Once | INTRAVENOUS | Status: AC | PRN
  Administered 2018-09-25: 500 [IU]
  Filled 2018-09-25: qty 5

## 2018-09-25 MED ORDER — SODIUM CHLORIDE 0.9% FLUSH
10.0000 mL | Freq: Once | INTRAVENOUS | Status: AC
  Administered 2018-09-25: 10 mL
  Filled 2018-09-25: qty 10

## 2018-09-25 MED ORDER — SODIUM CHLORIDE 0.9 % IV SOLN
80.0000 mg/m2 | Freq: Once | INTRAVENOUS | Status: AC
  Administered 2018-09-25: 204 mg via INTRAVENOUS
  Filled 2018-09-25: qty 34

## 2018-09-25 MED ORDER — PALONOSETRON HCL INJECTION 0.25 MG/5ML
INTRAVENOUS | Status: AC
  Filled 2018-09-25: qty 5

## 2018-09-25 MED ORDER — FAMOTIDINE IN NACL 20-0.9 MG/50ML-% IV SOLN
INTRAVENOUS | Status: AC
  Filled 2018-09-25: qty 50

## 2018-09-25 NOTE — Progress Notes (Signed)
Per Dr Lindi Adie ok to start Taxol tx today with HR 110

## 2018-09-25 NOTE — Progress Notes (Signed)
Cassandra Sanders  [phone] [phone]     ID: Cassandra Sanders DOB: 25-Jan-1965  MR#: [id number]  FOY#:[id number]  Patient Care Team: Cassandra Odor, PA-C as PCP - General (Nurse Practitioner) Cassandra Germany, RN as Oncology Nurse Navigator Cassandra Kaufmann, RN as Oncology Nurse Navigator Sanders, Cassandra Dad, MD as Consulting Physician (Oncology) Cassandra Bookbinder, MD as Consulting Physician (General Surgery) Cassandra Rudd, MD as Consulting Physician (Radiation Oncology) Cassandra Pearson, MD as Consulting Physician (Obstetrics and Gynecology) Cassandra Cruel, MD OTHER MD:  CHIEF COMPLAINT: Triple negative breast cancer  CURRENT TREATMENT: Adjuvant chemotherapy  INTERVAL HISTORY: Cassandra Sanders returns today for a follow-up and treatment of her triple negative breast cancer. She was last seen here on 09/18/2018 by Dr. Jana Sanders  She continues on adjuvant chemotherapy, and completed 4 cycles of dose dense doxorubicin and cyclophosphamide given every 14 days. Today is her second weekly dose of carboplatin and paclitaxel, which she will receive weekly x12.  Since her last visit here, she has not undergone any additional studies.   REVIEW OF SYSTEMS: Cassandra patient is feeling well today. She completed her 4 cycles of dose dense doxorubicin and cyclophosphamide. She tolerated her first dose of weekly carboplatin and paclitaxel well without any adverse side effects following treatment. She felt mildy fatigued for a single day on Sunday following her treatment but otherwise felt well. She denies any recent fevers, chills, night sweats, new lymphadenopathy, or weight loss. She denies any nausea, vomiting, diarrhea, or constipation. She has compazine for her anti-emetic which she had not needed to use. She denies any headache or visual changes. She denies any rashes or skin changes. She is still working full time, partly at home and partly in Cassandra office. She also tries  to maintain her activity/exercise with walking 15-30 minutes every day.A detailed review of systems today was otherwise stable   HISTORY OF CURRENT ILLNESS: From Cassandra original intake note:  Cassandra Sanders had routine screening mammography on 05/15/2018 at [facility] showing a possible abnormality in Cassandra left breast. She underwent bilateral diagnostic mammography with tomography and left breast ultrasonography at [facility] on 06/02/2018 showing: breast density category B; suspicious approximate 0.9 cm mass involving Cassandra upper inner quadrant of Cassandra left breast at posterior depth which account for Cassandra screening mammographic finding; no pathologic left axillary lymphadenopathy.  Accordingly on 06/04/2018 she proceeded to biopsy of Cassandra left breast area in question. Cassandra pathology from this procedure ([id number]) showed: invasive ductal carcinoma, grade 3. Prognostic indicators significant for: estrogen receptor, 60% positive with moderate staining intensity and progesterone receptor, 0% negative. Proliferation marker Ki67 at 80%. HER2 negative by immunohistochemistry (0).  Cassandra patient's subsequent history is as detailed below.  MEDICAL HISTORY: Past Medical History:  Diagnosis Date  . Breast cancer ([place]) 06/04/2018   left breast  . Diabetes mellitus without complication ([place])   . Hypertension     ALLERGIES:  has No Known Allergies.  MEDICATIONS:  Current Outpatient Medications  Medication Sig Dispense Refill  . acetaminophen-codeine (TYLENOL #3) 300-30 MG tablet     . aspirin EC 81 MG tablet Take 81 mg by mouth daily.    Marland Kitchen ibuprofen (ADVIL) 800 MG tablet     . JANUVIA 100 MG tablet daily.     Marland Kitchen lidocaine-prilocaine (EMLA) cream Apply to affected area once 30 g 3  . lisinopril (PRINIVIL,ZESTRIL) 10 MG tablet Take 10 mg by mouth daily.  2  . loratadine (CLARITIN) 10 MG tablet  Take 1 tablet (10 mg total) by mouth daily. 90 tablet 1  . METFORMIN HCL PO Take 1,000 mg by  mouth daily.     Marland Kitchen omeprazole (PRILOSEC) 40 MG capsule TAKE 1 CAPSULE BY MOUTH EVERY DAY 30 capsule 0  . prochlorperazine (COMPAZINE) 10 MG tablet Take 1 tablet (10 mg total) by mouth every 6 (six) hours as needed (Nausea or vomiting). 30 tablet 1   No current facility-administered medications for this visit.    Facility-Administered Medications Ordered in Other Visits  Medication Dose Route Frequency Provider Last Rate Last Dose  . CARBOplatin (PARAPLATIN) 300 mg in sodium chloride 0.9 % 250 mL chemo infusion  300 mg Intravenous Once Sanders, Cassandra Dad, MD      . heparin lock flush 100 unit/mL  500 Units Intracatheter Once PRN Sanders, Cassandra Dad, MD      . PACLitaxel (TAXOL) 204 mg in sodium chloride 0.9 % 250 mL chemo infusion (</= 81m/m2)  80 mg/m2 (Treatment Plan Recorded) Intravenous Once MChauncey Cruel MD 71 mL/hr at 09/25/18 1118 204 mg at 09/25/18 1118  . sodium chloride flush (NS) 0.9 % injection 10 mL  10 mL Intracatheter PRN Sanders, GVirgie Dad MD       FAMILY HISTORY: No family history on file. Patient's father was in his 314[place]hen he was murdered. Patient's mother is currently (as of 06/2018) at age 54 Cassandra patient denies a family hx of breast or ovarian cancer. She has 3 brothers and no biological sisters.   GYNECOLOGIC HISTORY:  Patient's last menstrual period was 06/18/2013. Menarche: 53years old Age at first live birth: 53years old [place] 1 LMP June 2015 Contraceptive used from age [age]with no complications HRT none Hysterectomy? no BSO? 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 She is engaged to [employer] Sanders She lives at home with her son RAlanda Sanders age 549 who [place] a [place]useum/gallery exhibitions officer  Cassandra patient attends [employer]              ADVANCED DIRECTIVES: not in place; at Cassandra 06/10/2018 visit Cassandra patient was given Cassandra appropriate  documents to complete and notarize at her discretion.  She intends to name her son is her healthcare power of attorney  PHYSICAL EXAMINATION:  Blood pressure (!) 142/90, pulse 100, temperature (!) 97.3 F (36.3 C), resp. rate 18, height 5' 9"  (1.753 m), weight 269 lb 14.4 oz (122.4 kg), last menstrual period 06/18/2013, SpO2 100 %.  ECOG PERFORMANCE STATUS: 1 - Symptomatic but completely ambulatory  Physical Exam  Constitutional: Oriented to person, place, and time and well-developed, well-nourished, and in no distress.  HENT:  Head: Normocephalic and atraumatic.  Mouth/Throat: Oropharynx is clear and moist. No oropharyngeal exudate.  Eyes: Conjunctivae are normal. Right eye exhibits no discharge. Left eye exhibits no discharge. No scleral icterus.  Neck: Normal range of motion. Neck supple.  Cardiovascular: Normal rate, regular rhythm, normal heart sounds and intact distal pulses.   Pulmonary/Chest: Effort normal and breath sounds normal. No respiratory distress. No wheezes. No rales.  Abdominal: Soft. Bowel sounds are normal. Exhibits no distension and no mass. There is no tenderness.  Musculoskeletal: Normal range of motion. Exhibits no edema.  Lymphadenopathy:    No cervical adenopathy.  Neurological: Alert and oriented to person, place, and time. Exhibits normal muscle tone. Gait normal. Coordination normal.  Skin: Skin is warm  and dry. No rash noted. Not diaphoretic. No erythema. No pallor.  Psychiatric: Mood, memory and judgment normal.  Vitals reviewed.  LABORATORY DATA: Lab Results  Component Value Date   WBC 4.3 09/25/2018   HGB 9.0 (L) 09/25/2018   HCT 27.4 (L) 09/25/2018   MCV 91.0 09/25/2018   PLT 387 09/25/2018      Chemistry      Component Value Date/Time   NA 140 09/25/2018 0826   K 3.9 09/25/2018 0826   CL 105 09/25/2018 0826   CO2 25 09/25/2018 0826   BUN 10 09/25/2018 0826   CREATININE 0.84 09/25/2018 0826   CREATININE 1.14 (H) 06/10/2018 0859       Component Value Date/Time   CALCIUM 9.3 09/25/2018 0826   ALKPHOS 82 09/25/2018 0826   AST 13 (L) 09/25/2018 0826   AST 13 (L) 06/10/2018 0859   ALT 21 09/25/2018 0826   ALT 17 06/10/2018 0859   BILITOT 0.3 09/25/2018 0826   BILITOT 0.3 06/10/2018 0859       RADIOGRAPHIC STUDIES:  No results found.   ELIGIBLE FOR AVAILABLE RESEARCH PROTOCOL: no  ASSESSMENT: 53 y.o. [place] woman status post left breast upper inner quadrant biopsy 06/04/2018 for a clinical T1b N0, stage IB invasive ductal carcinoma, grade 3, estrogen receptor moderately positive, progesterone receptor negative, with an MIB-1 of 80%, and HER-2 nonamplified  (1) left lumpectomy and sentinel lymph node sampling 06/26/2018 showed a pT1c pN0, stage IB invasive ductal carcinoma, grade 3, with negative margins             (a) repeat prognostic panel triple negative             (b) a total of 3 sentinel lymph nodes were removed  (2) Oncotype score of 56 obtained from Cassandra 06/04/2018 biopsy predicts a risk of outside Cassandra breast recurrence over Cassandra next 9 years of nearly 40% if Cassandra patient's only systemic therapy is antiestrogens for 5 years.  It also predicts a significant benefit from chemotherapy.             (a) Oncotype reads Cassandra tumor also as triple negative.  (3) adjuvant chemo therapy will consist of cyclophosphamide and doxorubicin in dose dense fashion x4 starting 07/17/2018, followed by weekly paclitaxel and carboplatin x12 starting on 09/18/2018             (a) echo on 07/16/2018 shows EF of 60-65%  (4) adjuvant radiation to follow chemotherapy  PLAN: Cassandra Sanders did well with Cassandra first, more intensive portion of her chemo with doxorubicin and cyclophosphamide x4. She recently started her weekly paclitaxel and carboplatin and is status post her first weekly dose. She tolerated it well without any adverse side effects except for a single day of mild fatigue.   Labs were reviewed with Cassandra patient. I recommend that  she proceed with her second weekly dose of carboplatin and paclitaxel today as scheduled.   She has a prescription for compazine for nausea if needed. She has not needed to use it yet.  Her A1C was drawn today. Her lab work is still pending at this time. We will discuss these results with her once available or at future visits.   She will continue to exercise per her current exercise program.   Cassandra patient was advised to call immediately if she has any concerning symptoms in Cassandra interval. Cassandra patient voices understanding of current disease status and treatment options and is in agreement with Cassandra current care plan. All questions  were answered. Cassandra patient knows to call [facility]andra clinic with any problems, questions or concerns. We can certainly see Cassandra patient much sooner if necessary  No orders of Cassandra defined types were placed in this encounter.    Cassandra L Heilingoetter, PA-C 09/25/18

## 2018-09-25 NOTE — Patient Instructions (Signed)
[facility] Discharge Instructions for Patients Receiving Chemotherapy  Today you received the following chemotherapy agents Taxol and Carboplatin  To help prevent nausea and vomiting after your treatment, we encourage you to take your nausea medication as prescribed.   If you develop nausea and vomiting that is not controlled by your nausea medication, call the clinic.   BELOW ARE SYMPTOMS THAT SHOULD BE REPORTED IMMEDIATELY:  *FEVER GREATER THAN 100.5 F  *CHILLS WITH OR WITHOUT FEVER  NAUSEA AND VOMITING THAT IS NOT CONTROLLED WITH YOUR NAUSEA MEDICATION  *UNUSUAL SHORTNESS OF BREATH  *UNUSUAL BRUISING OR BLEEDING  TENDERNESS IN MOUTH AND THROAT WITH OR WITHOUT PRESENCE OF ULCERS  *URINARY PROBLEMS  *BOWEL PROBLEMS  UNUSUAL RASH Items with * indicate a potential emergency and should be followed up as soon as possible.  Feel free to call the clinic should you have any questions or concerns. The clinic phone number is [phone].  Please show the CHEMO ALERT CARD at check-in to the Emergency Department and triage nurse.     Paclitaxel injection What is this medicine? PACLITAXEL (PAK li TAX el) is a chemotherapy drug. It targets fast dividing cells, like cancer cells, and causes these cells to die. This medicine is used to treat ovarian cancer, breast cancer, lung cancer, Kaposi's sarcoma, and other cancers. This medicine may be used for other purposes; ask your health care provider or pharmacist if you have questions. COMMON BRAND NAME(S): Onxol, Taxol What should I tell my health care provider before I take this medicine? They need to know if you have any of these conditions:  history of irregular heartbeat  liver disease  low blood counts, like low white cell, platelet, or red cell counts  lung or breathing disease, like asthma  tingling of the fingers or toes, or other nerve disorder  an unusual or allergic reaction to paclitaxel, alcohol,  polyoxyethylated castor oil, other chemotherapy, other medicines, foods, dyes, or preservatives  pregnant or trying to get pregnant  breast-feeding How should I use this medicine? This drug is given as an infusion into a vein. It is administered in a hospital or clinic by a specially trained health care professional. Talk to your pediatrician regarding the use of this medicine in children. Special care may be needed. Overdosage: If you think you have taken too much of this medicine contact a poison control center or emergency room at once. NOTE: This medicine is only for you. Do not share this medicine with others. What if I miss a dose? It is important not to miss your dose. Call your doctor or health care professional if you are unable to keep an appointment. What may interact with this medicine? Do not take this medicine with any of the following medications:  disulfiram  metronidazole This medicine may also interact with the following medications:  antiviral medicines for hepatitis, HIV or AIDS  certain antibiotics like erythromycin and clarithromycin  certain medicines for fungal infections like ketoconazole and itraconazole  certain medicines for seizures like carbamazepine, phenobarbital, phenytoin  gemfibrozil  nefazodone  rifampin  St. John's wort This list may not describe all possible interactions. Give your health care provider a list of all the medicines, herbs, non-prescription drugs, or dietary supplements you use. Also tell them if you smoke, drink alcohol, or use illegal drugs. Some items may interact with your medicine. What should I watch for while using this medicine? Your condition will be monitored carefully while you are receiving this medicine. You   will need important blood work done while you are taking this medicine. This medicine can cause serious allergic reactions. To reduce your risk you will need to take other medicine(s) before treatment with this  medicine. If you experience allergic reactions like skin rash, itching or hives, swelling of the face, lips, or tongue, tell your doctor or health care professional right away. In some cases, you may be given additional medicines to help with side effects. Follow all directions for their use. This drug may make you feel generally unwell. This is not uncommon, as chemotherapy can affect healthy cells as well as cancer cells. Report any side effects. Continue your course of treatment even though you feel ill unless your doctor tells you to stop. Call your doctor or health care professional for advice if you get a fever, chills or sore throat, or other symptoms of a cold or flu. Do not treat yourself. This drug decreases your body's ability to fight infections. Try to avoid being around people who are sick. This medicine may increase your risk to bruise or bleed. Call your doctor or health care professional if you notice any unusual bleeding. Be careful brushing and flossing your teeth or using a toothpick because you may get an infection or bleed more easily. If you have any dental work done, tell your dentist you are receiving this medicine. Avoid taking products that contain aspirin, acetaminophen, ibuprofen, naproxen, or ketoprofen unless instructed by your doctor. These medicines may hide a fever. Do not become pregnant while taking this medicine. Women should inform their doctor if they wish to become pregnant or think they might be pregnant. There is a potential for serious side effects to an unborn child. Talk to your health care professional or pharmacist for more information. Do not breast-feed an infant while taking this medicine. Men are advised not to father a child while receiving this medicine. This product may contain alcohol. Ask your pharmacist or healthcare provider if this medicine contains alcohol. Be sure to tell all healthcare providers you are taking this medicine. Certain medicines,  like metronidazole and disulfiram, can cause an unpleasant reaction when taken with alcohol. The reaction includes flushing, headache, nausea, vomiting, sweating, and increased thirst. The reaction can last from 30 minutes to several hours. What side effects may I notice from receiving this medicine? Side effects that you should report to your doctor or health care professional as soon as possible:  allergic reactions like skin rash, itching or hives, swelling of the face, lips, or tongue  breathing problems  changes in vision  fast, irregular heartbeat  high or low blood pressure  mouth sores  pain, tingling, numbness in the hands or feet  signs of decreased platelets or bleeding - bruising, pinpoint red spots on the skin, black, tarry stools, blood in the urine  signs of decreased red blood cells - unusually weak or tired, feeling faint or lightheaded, falls  signs of infection - fever or chills, cough, sore throat, pain or difficulty passing urine  signs and symptoms of liver injury like dark yellow or brown urine; general ill feeling or flu-like symptoms; light-colored stools; loss of appetite; nausea; right upper belly pain; unusually weak or tired; yellowing of the eyes or skin  swelling of the ankles, feet, hands  unusually slow heartbeat Side effects that usually do not require medical attention (report to your doctor or health care professional if they continue or are bothersome):  diarrhea  hair loss  loss of appetite    muscle or joint pain  nausea, vomiting  pain, redness, or irritation at site where injected  tiredness This list may not describe all possible side effects. Call your doctor for medical advice about side effects. You may report side effects to FDA at 1-800-FDA-1088. Where should I keep my medicine? This drug is given in a hospital or clinic and will not be stored at home. NOTE: This sheet is a summary. It may not cover all possible information.  If you have questions about this medicine, talk to your doctor, pharmacist, or health care provider.  2020 [employer]/Gold Standard (2016-08-27 13:14:55)  Carboplatin injection What is this medicine? CARBOPLATIN (KAR boe pla tin) is a chemotherapy drug. It targets fast dividing cells, like cancer cells, and causes these cells to die. This medicine is used to treat ovarian cancer and many other cancers. This medicine may be used for other purposes; ask your health care provider or pharmacist if you have questions. COMMON BRAND NAME(S): Paraplatin What should I tell my health care provider before I take this medicine? They need to know if you have any of these conditions:  blood disorders  hearing problems  kidney disease  recent or ongoing radiation therapy  an unusual or allergic reaction to carboplatin, cisplatin, other chemotherapy, other medicines, foods, dyes, or preservatives  pregnant or trying to get pregnant  breast-feeding How should I use this medicine? This drug is usually given as an infusion into a vein. It is administered in a hospital or clinic by a specially trained health care professional. Talk to your pediatrician regarding the use of this medicine in children. Special care may be needed. Overdosage: If you think you have taken too much of this medicine contact a poison control center or emergency room at once. NOTE: This medicine is only for you. Do not share this medicine with others. What if I miss a dose? It is important not to miss a dose. Call your doctor or health care professional if you are unable to keep an appointment. What may interact with this medicine?  medicines for seizures  medicines to increase blood counts like filgrastim, pegfilgrastim, sargramostim  some antibiotics like amikacin, gentamicin, neomycin, streptomycin, tobramycin  vaccines Talk to your doctor or health care professional before taking any of these  medicines:  acetaminophen  aspirin  ibuprofen  ketoprofen  naproxen This list may not describe all possible interactions. Give your health care provider a list of all the medicines, herbs, non-prescription drugs, or dietary supplements you use. Also tell them if you smoke, drink alcohol, or use illegal drugs. Some items may interact with your medicine. What should I watch for while using this medicine? Your condition will be monitored carefully while you are receiving this medicine. You will need important blood work done while you are taking this medicine. This drug may make you feel generally unwell. This is not uncommon, as chemotherapy can affect healthy cells as well as cancer cells. Report any side effects. Continue your course of treatment even though you feel ill unless your doctor tells you to stop. In some cases, you may be given additional medicines to help with side effects. Follow all directions for their use. Call your doctor or health care professional for advice if you get a fever, chills or sore throat, or other symptoms of a cold or flu. Do not treat yourself. This drug decreases your body's ability to fight infections. Try to avoid being around people who are sick. This medicine may increase your   risk to bruise or bleed. Call your doctor or health care professional if you notice any unusual bleeding. Be careful brushing and flossing your teeth or using a toothpick because you may get an infection or bleed more easily. If you have any dental work done, tell your dentist you are receiving this medicine. Avoid taking products that contain aspirin, acetaminophen, ibuprofen, naproxen, or ketoprofen unless instructed by your doctor. These medicines may hide a fever. Do not become pregnant while taking this medicine. Women should inform their doctor if they wish to become pregnant or think they might be pregnant. There is a potential for serious side effects to an unborn child. Talk  to your health care professional or pharmacist for more information. Do not breast-feed an infant while taking this medicine. What side effects may I notice from receiving this medicine? Side effects that you should report to your doctor or health care professional as soon as possible:  allergic reactions like skin rash, itching or hives, swelling of the face, lips, or tongue  signs of infection - fever or chills, cough, sore throat, pain or difficulty passing urine  signs of decreased platelets or bleeding - bruising, pinpoint red spots on the skin, black, tarry stools, nosebleeds  signs of decreased red blood cells - unusually weak or tired, fainting spells, lightheadedness  breathing problems  changes in hearing  changes in vision  chest pain  high blood pressure  low blood counts - This drug may decrease the number of white blood cells, red blood cells and platelets. You may be at increased risk for infections and bleeding.  nausea and vomiting  pain, swelling, redness or irritation at the injection site  pain, tingling, numbness in the hands or feet  problems with balance, talking, walking  trouble passing urine or change in the amount of urine Side effects that usually do not require medical attention (report to your doctor or health care professional if they continue or are bothersome):  hair loss  loss of appetite  metallic taste in the mouth or changes in taste This list may not describe all possible side effects. Call your doctor for medical advice about side effects. You may report side effects to FDA at 1-800-FDA-1088. Where should I keep my medicine? This drug is given in a hospital or clinic and will not be stored at home. NOTE: This sheet is a summary. It may not cover all possible information. If you have questions about this medicine, talk to your doctor, pharmacist, or health care provider.  2020 [employer]vier/Gold Standard (2007-03-31 14:38:05)     

## 2018-09-26 LAB — HEMOGLOBIN A1C
Hgb A1c MFr Bld: 9.2 % — ABNORMAL HIGH (ref 4.8–5.6)
Mean Plasma Glucose: 217 mg/dL

## 2018-10-02 ENCOUNTER — Inpatient Hospital Stay: Payer: Federal, State, Local not specified - PPO

## 2018-10-02 ENCOUNTER — Other Ambulatory Visit: Payer: Self-pay

## 2018-10-02 ENCOUNTER — Encounter: Payer: Self-pay | Admitting: *Deleted

## 2018-10-02 VITALS — BP 164/93 | HR 91 | Temp 98.8°F | Resp 18

## 2018-10-02 DIAGNOSIS — Z791 Long term (current) use of non-steroidal anti-inflammatories (NSAID): Secondary | ICD-10-CM | POA: Diagnosis not present

## 2018-10-02 DIAGNOSIS — Z7984 Long term (current) use of oral hypoglycemic drugs: Secondary | ICD-10-CM | POA: Diagnosis not present

## 2018-10-02 DIAGNOSIS — C50212 Malignant neoplasm of upper-inner quadrant of left female breast: Secondary | ICD-10-CM | POA: Diagnosis not present

## 2018-10-02 DIAGNOSIS — Z95828 Presence of other vascular implants and grafts: Secondary | ICD-10-CM

## 2018-10-02 DIAGNOSIS — Z5111 Encounter for antineoplastic chemotherapy: Secondary | ICD-10-CM | POA: Diagnosis not present

## 2018-10-02 DIAGNOSIS — R0602 Shortness of breath: Secondary | ICD-10-CM | POA: Diagnosis not present

## 2018-10-02 DIAGNOSIS — Z79899 Other long term (current) drug therapy: Secondary | ICD-10-CM | POA: Diagnosis not present

## 2018-10-02 DIAGNOSIS — R000 Tachycardia, unspecified: Secondary | ICD-10-CM | POA: Diagnosis not present

## 2018-10-02 DIAGNOSIS — I10 Essential (primary) hypertension: Secondary | ICD-10-CM | POA: Diagnosis not present

## 2018-10-02 DIAGNOSIS — T451X5A Adverse effect of antineoplastic and immunosuppressive drugs, initial encounter: Secondary | ICD-10-CM | POA: Diagnosis not present

## 2018-10-02 DIAGNOSIS — E119 Type 2 diabetes mellitus without complications: Secondary | ICD-10-CM | POA: Diagnosis not present

## 2018-10-02 DIAGNOSIS — Z171 Estrogen receptor negative status [ER-]: Secondary | ICD-10-CM | POA: Diagnosis not present

## 2018-10-02 DIAGNOSIS — Z7982 Long term (current) use of aspirin: Secondary | ICD-10-CM | POA: Diagnosis not present

## 2018-10-02 DIAGNOSIS — Z170 Estrogen receptor positive status [ER+]: Secondary | ICD-10-CM

## 2018-10-02 LAB — COMPREHENSIVE METABOLIC PANEL
ALT: 26 U/L (ref 0–44)
AST: 20 U/L (ref 15–41)
Albumin: 3.9 g/dL (ref 3.5–5.0)
Alkaline Phosphatase: 81 U/L (ref 38–126)
Anion gap: 10 (ref 5–15)
BUN: 8 mg/dL (ref 6–20)
CO2: 24 mmol/L (ref 22–32)
Calcium: 9.1 mg/dL (ref 8.9–10.3)
Chloride: 106 mmol/L (ref 98–111)
Creatinine, Ser: 0.81 mg/dL (ref 0.44–1.00)
GFR calc Af Amer: >60 mL/min (ref >60)
GFR calc non Af Amer: >60 mL/min (ref >60)
Glucose, Bld: 167 mg/dL — ABNORMAL HIGH (ref 70–99)
Potassium: 3.7 mmol/L (ref 3.5–5.1)
Sodium: 140 mmol/L (ref 135–145)
Total Bilirubin: 0.3 mg/dL (ref 0.3–1.2)
Total Protein: 6.6 g/dL (ref 6.5–8.1)

## 2018-10-02 LAB — CBC WITH DIFFERENTIAL/PLATELET
Abs Immature Granulocytes: 0.02 10*3/uL (ref 0.00–0.07)
Basophils Absolute: 0 10*3/uL (ref 0.0–0.1)
Basophils Relative: 1 %
Eosinophils Absolute: 0 10*3/uL (ref 0.0–0.5)
Eosinophils Relative: 1 %
HCT: 25.7 % — ABNORMAL LOW (ref 36.0–46.0)
Hemoglobin: 8.6 g/dL — ABNORMAL LOW (ref 12.0–15.0)
Immature Granulocytes: 1 %
Lymphocytes Relative: 10 %
Lymphs Abs: 0.3 10*3/uL — ABNORMAL LOW (ref 0.7–4.0)
MCH: 30.9 pg (ref 26.0–34.0)
MCHC: 33.5 g/dL (ref 30.0–36.0)
MCV: 92.4 fL (ref 80.0–100.0)
Monocytes Absolute: 0.4 10*3/uL (ref 0.1–1.0)
Monocytes Relative: 12 %
Neutro Abs: 2.3 10*3/uL (ref 1.7–7.7)
Neutrophils Relative %: 75 %
Platelets: 271 10*3/uL (ref 150–400)
RBC: 2.78 MIL/uL — ABNORMAL LOW (ref 3.87–5.11)
RDW: 17.2 % — ABNORMAL HIGH (ref 11.5–15.5)
WBC: 3 10*3/uL — ABNORMAL LOW (ref 4.0–10.5)
nRBC: 0 % (ref 0.0–0.2)

## 2018-10-02 MED ORDER — SODIUM CHLORIDE 0.9% FLUSH
10.0000 mL | INTRAVENOUS | Status: DC | PRN
  Administered 2018-10-02: 10 mL
  Filled 2018-10-02: qty 10

## 2018-10-02 MED ORDER — PALONOSETRON HCL INJECTION 0.25 MG/5ML
INTRAVENOUS | Status: AC
  Filled 2018-10-02: qty 5

## 2018-10-02 MED ORDER — SODIUM CHLORIDE 0.9 % IV SOLN
Freq: Once | INTRAVENOUS | Status: AC
  Administered 2018-10-02: 09:00:00 via INTRAVENOUS
  Filled 2018-10-02: qty 250

## 2018-10-02 MED ORDER — SODIUM CHLORIDE 0.9 % IV SOLN
80.0000 mg/m2 | Freq: Once | INTRAVENOUS | Status: AC
  Administered 2018-10-02: 204 mg via INTRAVENOUS
  Filled 2018-10-02: qty 34

## 2018-10-02 MED ORDER — HEPARIN SOD (PORK) LOCK FLUSH 100 UNIT/ML IV SOLN
500.0000 [IU] | Freq: Once | INTRAVENOUS | Status: AC | PRN
  Administered 2018-10-02: 500 [IU]
  Filled 2018-10-02: qty 5

## 2018-10-02 MED ORDER — DIPHENHYDRAMINE HCL 50 MG/ML IJ SOLN
50.0000 mg | Freq: Once | INTRAMUSCULAR | Status: AC
  Administered 2018-10-02: 50 mg via INTRAVENOUS

## 2018-10-02 MED ORDER — SODIUM CHLORIDE 0.9% FLUSH
10.0000 mL | Freq: Once | INTRAVENOUS | Status: AC
  Administered 2018-10-02: 09:00:00 10 mL
  Filled 2018-10-02: qty 10

## 2018-10-02 MED ORDER — PALONOSETRON HCL INJECTION 0.25 MG/5ML
0.2500 mg | Freq: Once | INTRAVENOUS | Status: AC
  Administered 2018-10-02: 0.25 mg via INTRAVENOUS

## 2018-10-02 MED ORDER — SODIUM CHLORIDE 0.9 % IV SOLN
300.0000 mg | Freq: Once | INTRAVENOUS | Status: AC
  Administered 2018-10-02: 300 mg via INTRAVENOUS
  Filled 2018-10-02: qty 30

## 2018-10-02 MED ORDER — SODIUM CHLORIDE 0.9 % IV SOLN
Freq: Once | INTRAVENOUS | Status: AC
  Administered 2018-10-02: 10:00:00 via INTRAVENOUS
  Filled 2018-10-02: qty 5

## 2018-10-02 MED ORDER — FAMOTIDINE IN NACL 20-0.9 MG/50ML-% IV SOLN
20.0000 mg | Freq: Once | INTRAVENOUS | Status: AC
  Administered 2018-10-02: 20 mg via INTRAVENOUS

## 2018-10-02 MED ORDER — FAMOTIDINE IN NACL 20-0.9 MG/50ML-% IV SOLN
INTRAVENOUS | Status: AC
  Filled 2018-10-02: qty 50

## 2018-10-02 MED ORDER — DIPHENHYDRAMINE HCL 50 MG/ML IJ SOLN
INTRAMUSCULAR | Status: AC
  Filled 2018-10-02: qty 1

## 2018-10-02 NOTE — Progress Notes (Signed)
Per MD Magrinat, benadryl dose prior to Taxol should be 50mg  IV, not 25mg  IV

## 2018-10-02 NOTE — Patient Instructions (Signed)
   [facility] Discharge Instructions for Patients Receiving Chemotherapy  Today you received the following chemotherapy agents Taxol and Carboplatin   To help prevent nausea and vomiting after your treatment, we encourage you to take your nausea medication as directed.    If you develop nausea and vomiting that is not controlled by your nausea medication, call the clinic.   BELOW ARE SYMPTOMS THAT SHOULD BE REPORTED IMMEDIATELY:  *FEVER GREATER THAN 100.5 F  *CHILLS WITH OR WITHOUT FEVER  NAUSEA AND VOMITING THAT IS NOT CONTROLLED WITH YOUR NAUSEA MEDICATION  *UNUSUAL SHORTNESS OF BREATH  *UNUSUAL BRUISING OR BLEEDING  TENDERNESS IN MOUTH AND THROAT WITH OR WITHOUT PRESENCE OF ULCERS  *URINARY PROBLEMS  *BOWEL PROBLEMS  UNUSUAL RASH Items with * indicate a potential emergency and should be followed up as soon as possible.  Feel free to call the clinic should you have any questions or concerns. The clinic phone number is [phone].  Please show the CHEMO ALERT CARD at check-in to the Emergency Department and triage nurse.   

## 2018-10-08 ENCOUNTER — Other Ambulatory Visit: Payer: Self-pay | Admitting: Adult Health

## 2018-10-08 DIAGNOSIS — C50212 Malignant neoplasm of upper-inner quadrant of left female breast: Secondary | ICD-10-CM

## 2018-10-08 DIAGNOSIS — Z170 Estrogen receptor positive status [ER+]: Secondary | ICD-10-CM

## 2018-10-08 NOTE — Progress Notes (Signed)
[facility]  [phone] [phone]     ID: Cassandra Sanders DOB: [birth year]  MR#: [id number]  EFE#:[id number]  Patient Care Team: Lennie Odor, PA-C as PCP - General (Nurse Practitioner) Rockwell Germany, RN as Oncology Nurse Navigator Mauro Kaufmann, RN as Oncology Nurse Navigator Magrinat, Virgie Dad, MD as Consulting Physician (Oncology) Rolm Bookbinder, MD as Consulting Physician (General Surgery) Kyung Rudd, MD as Consulting Physician (Radiation Oncology) Cassandra Pearson, MD as Consulting Physician (Obstetrics and Gynecology) Chauncey Cruel, MD OTHER MD:  CHIEF COMPLAINT: Triple negative breast cancer  CURRENT TREATMENT: Adjuvant chemotherapy  INTERVAL HISTORY: Cassandra Sanders returns today for a follow-up and treatment of her triple negative breast cancer. She was last seen here on 09/18/2018 by Dr. Jana Hakim  She continues on adjuvant chemotherapy, and completed 4 cycles of dose dense doxorubicin and cyclophosphamide given every 14 days. Today is her fourth weekly dose of carboplatin and paclitaxel, which she will receive weekly x12.  She had one-two episodes a day of peripheral neuropathy in her fingertips that lasted about two minutes this past week.  It has since resolved.    REVIEW OF SYSTEMS:  Cassandra Sanders is doing well today.  She denies any fever, chills, chest pain, palpitations, cough, shortness of breath, headaches, nausea, vomiting, bowel/bladder changes.  A detailed ROS was otherwise non contributory.     HISTORY OF CURRENT ILLNESS: From the original intake note:  Cassandra Sanders had routine screening mammography on 05/15/2018 at [facility] showing a possible abnormality in the left breast. She underwent bilateral diagnostic mammography with tomography and left breast ultrasonography at [facility] on 06/02/2018 showing: breast density category B; suspicious approximate 0.9 cm mass involving the upper inner  quadrant of the left breast at posterior depth which account for the screening mammographic finding; no pathologic left axillary lymphadenopathy.  Accordingly on 06/04/2018 she proceeded to biopsy of the left breast area in question. The pathology from this procedure ([id number]) showed: invasive ductal carcinoma, grade 3. Prognostic indicators significant for: estrogen receptor, 60% positive with moderate staining intensity and progesterone receptor, 0% negative. Proliferation marker Ki67 at 80%. HER2 negative by immunohistochemistry (0).  The patient's subsequent history is as detailed below.  MEDICAL HISTORY: Past Medical History:  Diagnosis Date   Breast cancer ([place]) 06/04/2018   left breast   Diabetes mellitus without complication ([place])    Hypertension     ALLERGIES:  has No Known Allergies.  MEDICATIONS:  Current Outpatient Medications  Medication Sig Dispense Refill   acetaminophen-codeine (TYLENOL #3) 300-30 MG tablet      aspirin EC 81 MG tablet Take 81 mg by mouth daily.     ibuprofen (ADVIL) 800 MG tablet      JANUVIA 100 MG tablet daily.      lidocaine-prilocaine (EMLA) cream Apply to affected area once 30 g 3   lisinopril (PRINIVIL,ZESTRIL) 10 MG tablet Take 10 mg by mouth daily.  2   loratadine (CLARITIN) 10 MG tablet Take 1 tablet (10 mg total) by mouth daily. 90 tablet 1   METFORMIN HCL PO Take 1,000 mg by mouth daily.      omeprazole (PRILOSEC) 40 MG capsule TAKE 1 CAPSULE BY MOUTH EVERY DAY 30 capsule 0   prochlorperazine (COMPAZINE) 10 MG tablet Take 1 tablet (10 mg total) by mouth every 6 (six) hours as needed (Nausea or vomiting). 30 tablet 1   No current facility-administered medications for this visit.    Facility-Administered Medications Ordered in Other  Visits  Medication Dose Route Frequency Provider Last Rate Last Dose   CARBOplatin (PARAPLATIN) 300 mg in sodium chloride 0.9 % 100 mL chemo infusion  300 mg Intravenous Once Magrinat, Virgie Dad, MD       heparin lock flush 100 unit/mL  500 Units Intracatheter Once PRN Magrinat, Virgie Dad, MD       PACLitaxel (TAXOL) 204 mg in sodium chloride 0.9 % 250 mL chemo infusion (</= 66m/m2)  80 mg/m2 (Treatment Plan Recorded) Intravenous Once MChauncey Cruel MD 284 mL/hr at 10/09/18 1111 204 mg at 10/09/18 1111   sodium chloride flush (NS) 0.9 % injection 10 mL  10 mL Intracatheter PRN Magrinat, GVirgie Dad MD       FAMILY HISTORY: No family history on file. Patient's father was in his 89swhen he was murdered. Patient's mother is currently (as of 06/2018) at age 54 The patient denies a family hx of breast or ovarian cancer. She has 3 brothers and no biological sisters.   GYNECOLOGIC HISTORY:  Patient's last menstrual period was 06/18/2013. Menarche: 53years old Age at first live birth: 53years old [place] 1 LMP June 2015 Contraceptive used from age [age]with no complications HRT none Hysterectomy? no BSO? 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 [facility] She lives at home with her son RAlanda Amass age 54 who [place]gallery exhibitions officer  The patient attends [employer]              ADVANCED DIRECTIVES: not in place; at the 06/10/2018 visit the patient was given the appropriate documents to complete and notarize at her discretion.  She intends to name her son is her healthcare power of attorney  PHYSICAL EXAMINATION:    Blood pressure (!) 142/88, pulse 94, temperature 98 F (36.7 C), temperature source Temporal, resp. rate 18, height 5' 9"  (1.753 m), weight 269 lb 9.6 oz (122.3 kg), last menstrual period 06/18/2013, SpO2 100 %.  ECOG PERFORMANCE STATUS: 1 - Symptomatic but completely ambulatory GENERAL: Patient is a well appearing female in no acute distress HEENT:  Sclerae anicteric.  Oropharynx clear and moist. No ulcerations  or evidence of oropharyngeal candidiasis. Neck is supple.  NODES:  No cervical, supraclavicular, or axillary lymphadenopathy palpated.  BREAST EXAM:  Deferred. LUNGS:  Clear to auscultation bilaterally.  No wheezes or rhonchi. HEART:  Regular rate and rhythm. No murmur appreciated. ABDOMEN:  Soft, nontender.  Positive, normoactive bowel sounds. No organomegaly palpated. MSK:  No focal spinal tenderness to palpation. Full range of motion bilaterally in the upper extremities. EXTREMITIES:  No peripheral edema.   SKIN:  Clear with no obvious rashes or skin changes. No nail dyscrasia. NEURO:  Nonfocal. Well oriented.  Appropriate affect.    LABORATORY DATA: Lab Results  Component Value Date   WBC 3.2 (L) 10/09/2018   HGB 8.8 (L) 10/09/2018   HCT 26.2 (L) 10/09/2018   MCV 91.3 10/09/2018   PLT 260 10/09/2018      Chemistry      Component Value Date/Time   NA 138 10/09/2018 0833   K 3.7 10/09/2018 0833   CL 104 10/09/2018 0833   CO2 24 10/09/2018 0833   BUN 8 10/09/2018 0833   CREATININE 0.77 10/09/2018 0833   CREATININE 1.14 (H) 06/10/2018 0859      Component Value Date/Time   CALCIUM 9.3  10/09/2018 0833   ALKPHOS 85 10/09/2018 0833   AST 19 10/09/2018 0833   AST 13 (L) 06/10/2018 0859   ALT 31 10/09/2018 0833   ALT 17 06/10/2018 0859   BILITOT 0.3 10/09/2018 0833   BILITOT 0.3 06/10/2018 0859       RADIOGRAPHIC STUDIES:  No results found.   ELIGIBLE FOR AVAILABLE RESEARCH PROTOCOL: no  ASSESSMENT: 53 y.o. Cassandra Sanders woman status post left breast upper inner quadrant biopsy 06/04/2018 for a clinical T1b N0, stage IB invasive ductal carcinoma, grade 3, estrogen receptor moderately positive, progesterone receptor negative, with an MIB-1 of 80%, and HER-2 nonamplified  (1) left lumpectomy and sentinel lymph node sampling 06/26/2018 showed a pT1c pN0, stage IB invasive ductal carcinoma, grade 3, with negative margins             (a) repeat prognostic panel triple  negative             (b) a total of 3 sentinel lymph nodes were removed  (2) Oncotype score of 56 obtained from the 06/04/2018 biopsy predicts a risk of outside the breast recurrence over the next 9 years of nearly 40% if the patient's only systemic therapy is antiestrogens for 5 years.  It also predicts a significant benefit from chemotherapy.             (a) Oncotype reads the tumor also as triple negative.  (3) adjuvant chemo therapy will consist of cyclophosphamide and doxorubicin in dose dense fashion x4 starting 07/17/2018, followed by weekly paclitaxel and carboplatin x12 starting on 09/18/2018             (a) echo on 07/16/2018 shows EF of 60-65%  (4) adjuvant radiation to follow chemotherapy  PLAN: Cassandra Sanders is doing well today and is tolerating her chemotherapy well.  Her labs are stable and she will proceed with her fourth cycle of weekly Paclitaxel and Carboplatin today.    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  We will now see her on a weekly basis and monitor her very closely.  I let her know that if it becomes constant we will likely be stopping therapy, due to the debilitation that neuropathy can bring.    Cassandra Sanders was recommended to continue with exercise and healthy diet.    We will see her back in 1 week for labs and f/u prior to her next treatment.  She was recommended to continue with the appropriate pandemic precautions. She knows to call for any questions that may arise between now and her next appointment.  We are happy to see her sooner if needed.  A total of (20) minutes of face-to-face time was spent with this patient with greater than 50% of that time in counseling and care-coordination.     Wilber Bihari, NP 10/09/18

## 2018-10-09 ENCOUNTER — Inpatient Hospital Stay: Payer: Federal, State, Local not specified - PPO

## 2018-10-09 ENCOUNTER — Other Ambulatory Visit: Payer: Self-pay

## 2018-10-09 ENCOUNTER — Inpatient Hospital Stay: Payer: Federal, State, Local not specified - PPO | Admitting: Adult Health

## 2018-10-09 ENCOUNTER — Encounter: Payer: Self-pay | Admitting: Adult Health

## 2018-10-09 ENCOUNTER — Inpatient Hospital Stay: Payer: Federal, State, Local not specified - PPO | Attending: Oncology

## 2018-10-09 VITALS — BP 142/88 | HR 94 | Temp 98.0°F | Resp 18 | Ht 69.0 in | Wt 269.6 lb

## 2018-10-09 DIAGNOSIS — Z79899 Other long term (current) drug therapy: Secondary | ICD-10-CM | POA: Diagnosis not present

## 2018-10-09 DIAGNOSIS — Z170 Estrogen receptor positive status [ER+]: Secondary | ICD-10-CM

## 2018-10-09 DIAGNOSIS — Z7984 Long term (current) use of oral hypoglycemic drugs: Secondary | ICD-10-CM | POA: Insufficient documentation

## 2018-10-09 DIAGNOSIS — I10 Essential (primary) hypertension: Secondary | ICD-10-CM | POA: Insufficient documentation

## 2018-10-09 DIAGNOSIS — C50212 Malignant neoplasm of upper-inner quadrant of left female breast: Secondary | ICD-10-CM | POA: Diagnosis not present

## 2018-10-09 DIAGNOSIS — Z90722 Acquired absence of ovaries, bilateral: Secondary | ICD-10-CM | POA: Diagnosis not present

## 2018-10-09 DIAGNOSIS — Z171 Estrogen receptor negative status [ER-]: Secondary | ICD-10-CM

## 2018-10-09 DIAGNOSIS — Z7982 Long term (current) use of aspirin: Secondary | ICD-10-CM | POA: Diagnosis not present

## 2018-10-09 DIAGNOSIS — E1140 Type 2 diabetes mellitus with diabetic neuropathy, unspecified: Secondary | ICD-10-CM | POA: Insufficient documentation

## 2018-10-09 DIAGNOSIS — Z5111 Encounter for antineoplastic chemotherapy: Secondary | ICD-10-CM | POA: Insufficient documentation

## 2018-10-09 DIAGNOSIS — Z95828 Presence of other vascular implants and grafts: Secondary | ICD-10-CM

## 2018-10-09 LAB — CBC WITH DIFFERENTIAL/PLATELET
Abs Immature Granulocytes: 0.02 10*3/uL (ref 0.00–0.07)
Basophils Absolute: 0 10*3/uL (ref 0.0–0.1)
Basophils Relative: 1 %
Eosinophils Absolute: 0.1 10*3/uL (ref 0.0–0.5)
Eosinophils Relative: 2 %
HCT: 26.2 % — ABNORMAL LOW (ref 36.0–46.0)
Hemoglobin: 8.8 g/dL — ABNORMAL LOW (ref 12.0–15.0)
Immature Granulocytes: 1 %
Lymphocytes Relative: 9 %
Lymphs Abs: 0.3 10*3/uL — ABNORMAL LOW (ref 0.7–4.0)
MCH: 30.7 pg (ref 26.0–34.0)
MCHC: 33.6 g/dL (ref 30.0–36.0)
MCV: 91.3 fL (ref 80.0–100.0)
Monocytes Absolute: 0.4 10*3/uL (ref 0.1–1.0)
Monocytes Relative: 13 %
Neutro Abs: 2.4 10*3/uL (ref 1.7–7.7)
Neutrophils Relative %: 74 %
Platelets: 260 10*3/uL (ref 150–400)
RBC: 2.87 MIL/uL — ABNORMAL LOW (ref 3.87–5.11)
RDW: 17.6 % — ABNORMAL HIGH (ref 11.5–15.5)
WBC: 3.2 10*3/uL — ABNORMAL LOW (ref 4.0–10.5)
nRBC: 0 % (ref 0.0–0.2)

## 2018-10-09 LAB — COMPREHENSIVE METABOLIC PANEL
ALT: 31 U/L (ref 0–44)
AST: 19 U/L (ref 15–41)
Albumin: 3.9 g/dL (ref 3.5–5.0)
Alkaline Phosphatase: 85 U/L (ref 38–126)
Anion gap: 10 (ref 5–15)
BUN: 8 mg/dL (ref 6–20)
CO2: 24 mmol/L (ref 22–32)
Calcium: 9.3 mg/dL (ref 8.9–10.3)
Chloride: 104 mmol/L (ref 98–111)
Creatinine, Ser: 0.77 mg/dL (ref 0.44–1.00)
GFR calc Af Amer: >60 mL/min (ref >60)
GFR calc non Af Amer: >60 mL/min (ref >60)
Glucose, Bld: 152 mg/dL — ABNORMAL HIGH (ref 70–99)
Potassium: 3.7 mmol/L (ref 3.5–5.1)
Sodium: 138 mmol/L (ref 135–145)
Total Bilirubin: 0.3 mg/dL (ref 0.3–1.2)
Total Protein: 6.7 g/dL (ref 6.5–8.1)

## 2018-10-09 MED ORDER — SODIUM CHLORIDE 0.9% FLUSH
10.0000 mL | Freq: Once | INTRAVENOUS | Status: AC
  Administered 2018-10-09: 10 mL
  Filled 2018-10-09: qty 10

## 2018-10-09 MED ORDER — PALONOSETRON HCL INJECTION 0.25 MG/5ML
0.2500 mg | Freq: Once | INTRAVENOUS | Status: AC
  Administered 2018-10-09: 0.25 mg via INTRAVENOUS

## 2018-10-09 MED ORDER — DIPHENHYDRAMINE HCL 50 MG/ML IJ SOLN
INTRAMUSCULAR | Status: AC
  Filled 2018-10-09: qty 1

## 2018-10-09 MED ORDER — FAMOTIDINE IN NACL 20-0.9 MG/50ML-% IV SOLN
INTRAVENOUS | Status: AC
  Filled 2018-10-09: qty 50

## 2018-10-09 MED ORDER — FAMOTIDINE IN NACL 20-0.9 MG/50ML-% IV SOLN
20.0000 mg | Freq: Once | INTRAVENOUS | Status: AC
  Administered 2018-10-09: 20 mg via INTRAVENOUS

## 2018-10-09 MED ORDER — SODIUM CHLORIDE 0.9 % IV SOLN
Freq: Once | INTRAVENOUS | Status: AC
  Administered 2018-10-09: 10:00:00 via INTRAVENOUS
  Filled 2018-10-09: qty 250

## 2018-10-09 MED ORDER — SODIUM CHLORIDE 0.9% FLUSH
10.0000 mL | INTRAVENOUS | Status: DC | PRN
  Administered 2018-10-09: 10 mL
  Filled 2018-10-09: qty 10

## 2018-10-09 MED ORDER — DIPHENHYDRAMINE HCL 50 MG/ML IJ SOLN
50.0000 mg | Freq: Once | INTRAMUSCULAR | Status: AC
  Administered 2018-10-09: 50 mg via INTRAVENOUS

## 2018-10-09 MED ORDER — SODIUM CHLORIDE 0.9 % IV SOLN
300.0000 mg | Freq: Once | INTRAVENOUS | Status: AC
  Administered 2018-10-09: 300 mg via INTRAVENOUS
  Filled 2018-10-09: qty 30

## 2018-10-09 MED ORDER — PALONOSETRON HCL INJECTION 0.25 MG/5ML
INTRAVENOUS | Status: AC
  Filled 2018-10-09: qty 5

## 2018-10-09 MED ORDER — SODIUM CHLORIDE 0.9 % IV SOLN
Freq: Once | INTRAVENOUS | Status: AC
  Administered 2018-10-09: 10:00:00 via INTRAVENOUS
  Filled 2018-10-09: qty 5

## 2018-10-09 MED ORDER — SODIUM CHLORIDE 0.9 % IV SOLN
80.0000 mg/m2 | Freq: Once | INTRAVENOUS | Status: AC
  Administered 2018-10-09: 204 mg via INTRAVENOUS
  Filled 2018-10-09: qty 34

## 2018-10-09 MED ORDER — HEPARIN SOD (PORK) LOCK FLUSH 100 UNIT/ML IV SOLN
500.0000 [IU] | Freq: Once | INTRAVENOUS | Status: AC | PRN
  Administered 2018-10-09: 500 [IU]
  Filled 2018-10-09: qty 5

## 2018-10-09 NOTE — Patient Instructions (Signed)

## 2018-10-09 NOTE — Patient Instructions (Addendum)
   [facility] Discharge Instructions for Patients Receiving Chemotherapy  Today you received the following chemotherapy agents Taxol and Carboplatin   To help prevent nausea and vomiting after your treatment, we encourage you to take your nausea medication as directed.    If you develop nausea and vomiting that is not controlled by your nausea medication, call the clinic.   BELOW ARE SYMPTOMS THAT SHOULD BE REPORTED IMMEDIATELY:  *FEVER GREATER THAN 100.5 F  *CHILLS WITH OR WITHOUT FEVER  NAUSEA AND VOMITING THAT IS NOT CONTROLLED WITH YOUR NAUSEA MEDICATION  *UNUSUAL SHORTNESS OF BREATH  *UNUSUAL BRUISING OR BLEEDING  TENDERNESS IN MOUTH AND THROAT WITH OR WITHOUT PRESENCE OF ULCERS  *URINARY PROBLEMS  *BOWEL PROBLEMS  UNUSUAL RASH Items with * indicate a potential emergency and should be followed up as soon as possible.  Feel free to call the clinic should you have any questions or concerns. The clinic phone number is [phone].  Please show the CHEMO ALERT CARD at check-in to the Emergency Department and triage nurse.   

## 2018-10-16 ENCOUNTER — Inpatient Hospital Stay (HOSPITAL_BASED_OUTPATIENT_CLINIC_OR_DEPARTMENT_OTHER): Payer: Federal, State, Local not specified - PPO | Admitting: Physician Assistant

## 2018-10-16 ENCOUNTER — Inpatient Hospital Stay: Payer: Federal, State, Local not specified - PPO

## 2018-10-16 ENCOUNTER — Other Ambulatory Visit: Payer: Self-pay

## 2018-10-16 VITALS — BP 151/100 | HR 106 | Temp 98.3°F | Resp 18 | Ht 69.0 in | Wt 270.0 lb

## 2018-10-16 DIAGNOSIS — Z171 Estrogen receptor negative status [ER-]: Secondary | ICD-10-CM

## 2018-10-16 DIAGNOSIS — E1140 Type 2 diabetes mellitus with diabetic neuropathy, unspecified: Secondary | ICD-10-CM | POA: Diagnosis not present

## 2018-10-16 DIAGNOSIS — C50212 Malignant neoplasm of upper-inner quadrant of left female breast: Secondary | ICD-10-CM

## 2018-10-16 DIAGNOSIS — I10 Essential (primary) hypertension: Secondary | ICD-10-CM | POA: Diagnosis not present

## 2018-10-16 DIAGNOSIS — Z79899 Other long term (current) drug therapy: Secondary | ICD-10-CM | POA: Diagnosis not present

## 2018-10-16 DIAGNOSIS — Z95828 Presence of other vascular implants and grafts: Secondary | ICD-10-CM

## 2018-10-16 DIAGNOSIS — Z90722 Acquired absence of ovaries, bilateral: Secondary | ICD-10-CM | POA: Diagnosis not present

## 2018-10-16 DIAGNOSIS — Z7982 Long term (current) use of aspirin: Secondary | ICD-10-CM | POA: Diagnosis not present

## 2018-10-16 DIAGNOSIS — G629 Polyneuropathy, unspecified: Secondary | ICD-10-CM | POA: Insufficient documentation

## 2018-10-16 DIAGNOSIS — G6289 Other specified polyneuropathies: Secondary | ICD-10-CM | POA: Diagnosis not present

## 2018-10-16 DIAGNOSIS — Z170 Estrogen receptor positive status [ER+]: Secondary | ICD-10-CM

## 2018-10-16 DIAGNOSIS — C50912 Malignant neoplasm of unspecified site of left female breast: Secondary | ICD-10-CM | POA: Diagnosis not present

## 2018-10-16 DIAGNOSIS — Z7984 Long term (current) use of oral hypoglycemic drugs: Secondary | ICD-10-CM | POA: Diagnosis not present

## 2018-10-16 DIAGNOSIS — Z5111 Encounter for antineoplastic chemotherapy: Secondary | ICD-10-CM | POA: Diagnosis not present

## 2018-10-16 LAB — CBC WITH DIFFERENTIAL/PLATELET
Abs Immature Granulocytes: 0.02 10*3/uL (ref 0.00–0.07)
Basophils Absolute: 0 10*3/uL (ref 0.0–0.1)
Basophils Relative: 1 %
Eosinophils Absolute: 0 10*3/uL (ref 0.0–0.5)
Eosinophils Relative: 1 %
HCT: 26 % — ABNORMAL LOW (ref 36.0–46.0)
Hemoglobin: 8.8 g/dL — ABNORMAL LOW (ref 12.0–15.0)
Immature Granulocytes: 1 %
Lymphocytes Relative: 12 %
Lymphs Abs: 0.4 10*3/uL — ABNORMAL LOW (ref 0.7–4.0)
MCH: 31.3 pg (ref 26.0–34.0)
MCHC: 33.8 g/dL (ref 30.0–36.0)
MCV: 92.5 fL (ref 80.0–100.0)
Monocytes Absolute: 0.3 10*3/uL (ref 0.1–1.0)
Monocytes Relative: 9 %
Neutro Abs: 2.4 10*3/uL (ref 1.7–7.7)
Neutrophils Relative %: 76 %
Platelets: 187 10*3/uL (ref 150–400)
RBC: 2.81 MIL/uL — ABNORMAL LOW (ref 3.87–5.11)
RDW: 17.2 % — ABNORMAL HIGH (ref 11.5–15.5)
WBC: 3.1 10*3/uL — ABNORMAL LOW (ref 4.0–10.5)
nRBC: 0 % (ref 0.0–0.2)

## 2018-10-16 LAB — COMPREHENSIVE METABOLIC PANEL
ALT: 30 U/L (ref 0–44)
AST: 17 U/L (ref 15–41)
Albumin: 3.8 g/dL (ref 3.5–5.0)
Alkaline Phosphatase: 93 U/L (ref 38–126)
Anion gap: 10 (ref 5–15)
BUN: 10 mg/dL (ref 6–20)
CO2: 24 mmol/L (ref 22–32)
Calcium: 9.2 mg/dL (ref 8.9–10.3)
Chloride: 104 mmol/L (ref 98–111)
Creatinine, Ser: 0.78 mg/dL (ref 0.44–1.00)
GFR calc Af Amer: >60 mL/min (ref >60)
GFR calc non Af Amer: >60 mL/min (ref >60)
Glucose, Bld: 155 mg/dL — ABNORMAL HIGH (ref 70–99)
Potassium: 3.8 mmol/L (ref 3.5–5.1)
Sodium: 138 mmol/L (ref 135–145)
Total Bilirubin: 0.4 mg/dL (ref 0.3–1.2)
Total Protein: 6.7 g/dL (ref 6.5–8.1)

## 2018-10-16 MED ORDER — SODIUM CHLORIDE 0.9% FLUSH
10.0000 mL | Freq: Once | INTRAVENOUS | Status: AC
  Administered 2018-10-16: 09:00:00 10 mL
  Filled 2018-10-16: qty 10

## 2018-10-16 NOTE — Progress Notes (Addendum)
[facility] Telephone:(336) [phone] [phone]    TS:VXBLT [name]: Mar 14, 1967MR#: [id number] AQT#:[id number]  Patient Care Team: Lennie Odor, PA-C as PCP - General (Nurse Practitioner) Rockwell Germany, RN as Oncology Nurse Navigator Mauro Kaufmann, RN as Oncology Nurse Navigator Magrinat, Virgie Dad, MD as Consulting Physician (Oncology) Rolm Bookbinder, MD as Consulting Physician (General Surgery) Kyung Rudd, MD as Consulting Physician (Radiation Oncology) Marylynn Pearson, MD as Consulting Physician (Obstetrics and Gynecology) Chauncey Cruel, MD OTHER MD:  CHIEF COMPLAINT: Triple negative breast cancer  CURRENT TREATMENT:Adjuvant chemotherapy  INTERVAL HISTORY: Cassandra Sanders for a follow-up and treatment of her triple negative breast cancer. She was last seen here on10/02/2018 by Wilber Bihari NP  She continues onadjuvant chemotherapy, and completed 4 cycles ofdose dense doxorubicin and cyclophosphamidegivenevery 14 days. Today is her fifth weekly dose of carboplatin and paclitaxel, which she will receive weekly x12.  Since receiving her treatment last week, the patient has noticed an interval increase in her peripheral neuropathy. She notes that it is now persistent and occurs daily. 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 While typing she notices neuropathy. She also enjoys walking and feels numbness and tingling in her toes when she walks.    REVIEW OF SYSTEMS: Cassandra Sanders is doing well today besides the peripheral neuropathy. She also reports one episode of a mild nose bleed after blowing her nose yesterday. She denies any fever, chills, chest pain, palpitations, cough, shortness of breath, headaches, nausea, vomiting, bowel/bladder changes.  A detailed ROS was otherwise non contributory.     HISTORY OF CURRENT ILLNESS: From the original intake note:  Cassandra Sanders had routine  screening mammography on 05/15/2018 at [facility] showing a possible abnormality in the left breast. She underwent bilateral diagnostic mammography with tomography and left breast ultrasonography at [facility] on 06/02/2018 showing: breast density category B; suspicious approximate 0.9 cm mass involving the upper inner quadrant of the left breast at posterior depth which account for the screening mammographic finding; no pathologic left axillary lymphadenopathy.  Accordingly on 06/04/2018 she proceeded to biopsy of the left breast area in question. The pathology from this procedure ([id number]) showed: invasive ductal carcinoma, grade 3. Prognostic indicators significant for: estrogen receptor, 60% positive with moderate staining intensity and progesterone receptor, 0% negative. Proliferation marker Ki67 at 80%. HER2 negative by immunohistochemistry (0).  The patient's subsequent history is as detailed below.  MEDICAL HISTORY: Past Medical History:  Diagnosis Date  . Breast cancer ([place]) 06/04/2018   left breast  . Diabetes mellitus without complication ([place])   . Hypertension     ALLERGIES:  has No Known Allergies.  MEDICATIONS:  Current Outpatient Medications  Medication Sig Dispense Refill  . acetaminophen-codeine (TYLENOL #3) 300-30 MG tablet     . aspirin EC 81 MG tablet Take 81 mg by mouth daily.    Marland Kitchen ibuprofen (ADVIL) 800 MG tablet     . JANUVIA 100 MG tablet daily.     Marland Kitchen lidocaine-prilocaine (EMLA) cream Apply to affected area once 30 g 3  . lisinopril (PRINIVIL,ZESTRIL) 10 MG tablet Take 10 mg by mouth daily.  2  . loratadine (CLARITIN) 10 MG tablet Take 1 tablet (10 mg total) by mouth daily. 90 tablet 1  . METFORMIN HCL PO Take 1,000 mg by mouth daily.     Marland Kitchen omeprazole (PRILOSEC) 40 MG capsule TAKE 1 CAPSULE BY MOUTH EVERY DAY 30 capsule 0  . prochlorperazine (COMPAZINE) 10 MG tablet Take  1 tablet (10 mg total) by mouth every 6 (six) hours as needed (Nausea or  vomiting). 30 tablet 1   No current facility-administered medications for this visit.     SURGICAL HISTORY:  Past Surgical History:  Procedure Laterality Date  . BREAST LUMPECTOMY WITH RADIOACTIVE SEED AND SENTINEL LYMPH NODE BIOPSY Left 06/26/2018   Procedure: LEFT BREAST LUMPECTOMY WITH RADIOACTIVE SEED AND LEFT AXILLARY SENTINEL LYMPH NODE BIOPSY;  Surgeon: Rolm Bookbinder, MD;  Location: [facility];  Service: General;  Laterality: Left;  . PORTACATH PLACEMENT Right 06/26/2018   Procedure: INSERTION PORT-A-CATH WITH ULTRASOUND;  Surgeon: Rolm Bookbinder, MD;  Location: [facility];  Service: General;  Laterality: Right;   FAMILY HISTORY: No family history on file. Patient's father was in his 39s when he was murdered. Patient's mother is currently (as of 06/2018) at age 74. The patient denies a family hx of breast or ovarian cancer. She has 3 brothers and no biological sisters.   GYNECOLOGIC HISTORY: Patient's last menstrual period was 06/18/2013. Menarche: 53 years old Age at first live birth: 53 years old [place] P 1 LMP June 2015 Contraceptive used from age [age] with no complications HRT none Hysterectomy? no BSO? 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 She is engaged to [facility]. She lives at home with her son [name], age 94, who works here in [place] as a Museum/gallery exhibitions officer. The patient attends [employer].  ADVANCED DIRECTIVES: not in place; at the 06/10/2018 visit the patient was given the appropriate documents to complete and notarize at her discretion. She intends to name her son is her healthcare power of attorney  REVIEW OF SYSTEMS:   Review of Systems  Constitutional: Positive for fatigue. Negative for appetite change, chills, fever and unexpected weight change.  HENT: Positive for nosebleed one episode  yesterday. Negative for mouth sores, sore throat and trouble swallowing.   Eyes: Negative for eye problems and icterus.  Respiratory: Negative for cough, hemoptysis, shortness of breath and wheezing.   Cardiovascular: Negative for chest pain and leg swelling.  Gastrointestinal: Negative for abdominal pain, constipation, diarrhea, nausea and vomiting.  Genitourinary: Negative for bladder incontinence, difficulty urinating, dysuria, frequency and hematuria.   Musculoskeletal: Negative for back pain, gait problem, neck pain and neck stiffness.  Skin: Negative for itching and rash.  Neurological: Positive for neuropathy in her fingers and toes bilaterally. Negative for dizziness, extremity weakness, gait problem, headaches, light-headedness and seizures.  Hematological: Negative for adenopathy. Does not bruise/bleed easily.  Psychiatric/Behavioral: Negative for confusion, depression and sleep disturbance. The patient is not nervous/anxious.     PHYSICAL EXAMINATION:  Blood pressure (!) 151/100, pulse (!) 106, temperature 98.3 F (36.8 C), temperature source Temporal, resp. rate 18, height 5' 9" (1.753 m), weight 270 lb (122.5 kg), last menstrual period 06/18/2013, SpO2 100 %.  ECOG PERFORMANCE STATUS: 1 - Symptomatic but completely ambulatory  Physical Exam  Constitutional: Oriented to person, place, and time and well-developed, well-nourished, and in no distress.  HENT:  Head: Normocephalic and atraumatic.  Mouth/Throat: Oropharynx is clear and moist. No oropharyngeal exudate.  Eyes: Conjunctivae are normal. Right eye exhibits no discharge. Left eye exhibits no discharge. No scleral icterus.  Neck: Normal range of motion. Neck supple.  Cardiovascular: Normal rate, regular rhythm, normal heart sounds and intact distal pulses.   Pulmonary/Chest: Effort normal and breath sounds normal. No respiratory distress. No wheezes. No rales.  Abdominal: Soft. Bowel sounds are normal. Exhibits no  distension and no mass. There is no tenderness.  Musculoskeletal: Normal range of motion. Exhibits no edema.  Lymphadenopathy:    No cervical adenopathy.  Neurological: Positive for peripheral neuropathy. Alert and oriented to person, place, and time. Exhibits normal muscle tone. Gait normal. Coordination normal.  Skin: Skin is warm and dry. No rash noted. Not diaphoretic. No erythema. No pallor.  Psychiatric: Mood, memory and judgment normal.  Vitals reviewed.  LABORATORY DATA: Lab Results  Component Value Date   WBC 3.1 (L) 10/16/2018   HGB 8.8 (L) 10/16/2018   HCT 26.0 (L) 10/16/2018   MCV 92.5 10/16/2018   PLT 187 10/16/2018      Chemistry      Component Value Date/Time   NA 138 10/16/2018 0852   K 3.8 10/16/2018 0852   CL 104 10/16/2018 0852   CO2 24 10/16/2018 0852   BUN 10 10/16/2018 0852   CREATININE 0.78 10/16/2018 0852   CREATININE 1.14 (H) 06/10/2018 0859      Component Value Date/Time   CALCIUM 9.2 10/16/2018 0852   ALKPHOS 93 10/16/2018 0852   AST 17 10/16/2018 0852   AST 13 (L) 06/10/2018 0859   ALT 30 10/16/2018 0852   ALT 17 06/10/2018 0859   BILITOT 0.4 10/16/2018 0852   BILITOT 0.3 06/10/2018 0859       RADIOGRAPHIC STUDIES:  No results found.   ASSESSMENT/PLAN:  ELIGIBLE FOR AVAILABLE RESEARCH PROTOCOL: no  ASSESSMENT:53 y.o.Cassandra Sanders woman status post left breast upper inner quadrant biopsy 06/04/2018 for a clinical T1b N0, stage IB invasive ductal carcinoma, grade 3, estrogen receptor moderately positive, progesterone receptor negative, with an MIB-1 of 80%, and HER-2 nonamplified  (1) left lumpectomy and sentinel lymph node sampling 06/26/2018 showed apT1c pN0, stage IB invasive ductal carcinoma, grade 3, with negative margins (a) repeat prognostic paneltriple negative (b) a total of 3 sentinel lymph nodes were removed  (2) Oncotype score of 56 obtained from the 06/04/2018 biopsy predicts a risk of outside  the breast recurrence over the next 9 years of nearly 40% if the patient's only systemic therapy is antiestrogens for 5 years. It also predicts a significant benefit from chemotherapy. (a) Oncotype reads the tumor also as triple negative.  (3) adjuvant chemo therapy will consist of cyclophosphamide and doxorubicin in dose dense fashion x4 starting 07/17/2018, followed by weekly paclitaxel and carboplatin x12 starting on 09/18/2018 (a) echo on 07/16/2018 shows EF of 60-65%  (b) platinum/paclitaxel discontinued after 10/09/2018 (received 4 doses) due to neuropathy  (4) adjuvant radiation to follow chemotherapy. Referral placed on 10/16/2018.   PLAN: Aliceis doing well today and is tolerating her chemotherapy well.  Her labs are stable.   The patient was seen with Dr. Jana Hakim today. Dr. Jana Hakim recommends that the patient discontinue chemotherapy due to her worsening peripheral neuropathy. She will now move to the adjuvant radiation phase of her treatment. I have placed a referral for radiation oncology for consultation today.   We will see the patient back for a follow up visit in 2 weeks for evalution and to reassess her peripheral neuropathy.   She will continue to walk and eat healthy as she has been doing.   Regarding her nose bleeding, the patient was advised to avoid forceful blowing of her nose. She also can use saline spray to help prevent nasal dryness. She knows to seek medical attention if she experiences significant or profuse bleeding.  Regarding her diabetes and hypertension. This  is managed by her PCP. I will reach out to them to determine if they need to make any modifications to her medication regimen given her recently A1C that was drawn a few weeks ago.   The patient's neuropathy is mild at this time. However, I dicussed with the patient to please inform us if her symptoms worsen. If her symptoms worsen we may consider adding medication for her  peripheral neuropathy if needed. She does not feel as though she needs medications at this time but knows to call us if her symptoms worsen.   The patient was advised to call immediately if she has any concerning symptoms in the interval. The patient voices understanding of current disease status and treatment options and is in agreement with the current care plan. All questions were answered. The patient knows to call the clinic with any problems, questions or concerns. We can certainly see the patient much sooner if necessary  Orders Placed This Encounter  Procedures  . Ambulatory referral to Radiation Oncology    Referral Priority:   Routine    Referral Type:   Consultation    Referral Reason:   Specialty Services Required    Requested Specialty:   Radiation Oncology    Number of Visits Requested:   [place], PA-C 10/16/18    ADDENDUM: Lissa Merlin did very well with the more intense portion of her chemotherapy, cyclophosphamide and doxorubicin.  After 4 doses of paclitaxel and carboplatin she developed neuropathy which while still grade 1 is persistent.  I think we have to stop treatment now.  She understands that continuing could result in significant but potentially disabling neuropathy.  I also reassured her that she has received the major portion of her chemotherapy and that her prognosis will only be mildly affected by missing the remaining treatments.  She is now ready to proceed to adjuvant radiation and we have placed that referral.  We also discussed some of the possible toxicities side effects and complications as well as the benefits of radiation in terms of local recurrence.  I personally saw this patient and performed a substantive portion of this encounter with the listed APP documented above.   Chauncey Cruel, MD Medical Oncology and Hematology [facility] [address] [place], [place] [address] Tel. [phone]    Fax.  [phone]

## 2018-10-20 NOTE — Progress Notes (Addendum)
Radiation Oncology         [phone] ________________________________  Outpatient Follow Up Consultation - Conducted via telephone due to current COVID-19 concerns for limiting patient exposure  I spoke with the patient to conduct this consult visit via telephone to spare the patient unnecessary potential exposure in the healthcare setting during the current COVID-19 pandemic. The patient was notified in advance and was offered a [place] meeting to allow for face to face communication but unfortunately reported that they did not have the appropriate resources/technology to support such a visit and instead preferred to proceed with a telephone visit.  ________________________________  Name: Cassandra Sanders        MRN: [id number]  Date of Service: 10/21/2018 DOB: 1965-08-26  UX:LKGMWN, Noelle, PA-C  Sanders, Cassandr*     REFERRING PHYSICIAN: Heilingoetter, Cassandr*   DIAGNOSIS: The encounter diagnosis was Malignant neoplasm of upper-inner quadrant of left breast in female, estrogen receptor negative ([place]).   HISTORY OF PRESENT ILLNESS: Cassandra Sanders is a 53 y.o. female originally seen in the multidisciplinary breast clinic for a new diagnosis of left breast cancer. The patient was noted to have a screening detected mass in the left breast in the upper inner quadrant.  At ultrasound, a 9 x 7 x 6 mm mass was noted at the 10 o'clock position, and her axilla was negative for adenopathy.  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  Her tumor was ER positive though moderate staining, PR negative, HER-2 negative, and her Ki-67 was 80%.  There was some concern that this may indeed represent triple negative disease.  She proceeded with lumpectomy with sentinel node biopsy on 06/26/2018 revealing a 1.5 cm grade 3 invasive ductal carcinoma with associated DCIS. Three sampled nodes were negative, and her margins were negative. Her tumor was  triple negative. She proceeded with systemic chemotherapy between 07/17/2018 and discontinued on 10/09/2018 due to progressive neuropathy. She is contacted today to discuss adjuvant radiotherapy.    PREVIOUS RADIATION THERAPY: No   PAST MEDICAL HISTORY:  Past Medical History:  Diagnosis Date  . Breast cancer ([place]) 06/04/2018   left breast  . Diabetes mellitus without complication ([place])   . Hypertension        PAST SURGICAL HISTORY: Past Surgical History:  Procedure Laterality Date  . BREAST LUMPECTOMY WITH RADIOACTIVE SEED AND SENTINEL LYMPH NODE BIOPSY Left 06/26/2018   Procedure: LEFT BREAST LUMPECTOMY WITH RADIOACTIVE SEED AND LEFT AXILLARY SENTINEL LYMPH NODE BIOPSY;  Surgeon: Rolm Bookbinder, MD;  Location: [facility];  Service: General;  Laterality: Left;  . PORTACATH PLACEMENT Right 06/26/2018   Procedure: INSERTION PORT-A-CATH WITH ULTRASOUND;  Surgeon: Rolm Bookbinder, MD;  Location: [facility];  Service: General;  Laterality: Right;     FAMILY HISTORY: No family history on file.   SOCIAL HISTORY:  reports that she has never smoked. She has never used smokeless tobacco. She reports that she does not drink alcohol or use drugs. The patient is single and lives in [place]. She works for the [employer] in [employer].    ALLERGIES: Patient has no known allergies.   MEDICATIONS:  Current Outpatient Medications  Medication Sig Dispense Refill  . acetaminophen-codeine (TYLENOL #3) 300-30 MG tablet     . aspirin EC 81 MG tablet Take 81 mg by mouth daily.    Marland Kitchen ibuprofen (ADVIL) 800 MG tablet     . JANUVIA 100 MG tablet  daily.     . lisinopril (PRINIVIL,ZESTRIL) 10 MG tablet Take 10 mg by mouth daily.  2  . loratadine (CLARITIN) 10 MG tablet Take 1 tablet (10 mg total) by mouth daily. 90 tablet 1  . METFORMIN HCL PO Take 1,000 mg by mouth daily.     Marland Kitchen omeprazole (PRILOSEC) 40 MG capsule TAKE 1 CAPSULE BY MOUTH EVERY DAY  30 capsule 0   No current facility-administered medications for this visit.      REVIEW OF SYSTEMS: On review of systems, the patient reports that she is doing well overall. She reports she has not been having as much trouble with neuropathy since she stopped treatment. She denies any chest pain, shortness of breath, cough, fevers, chills, night sweats, unintended weight changes. She denies any bowel or bladder disturbances, and denies abdominal pain, nausea or vomiting. She denies any new musculoskeletal or joint aches or pains. A complete review of systems is obtained and is otherwise negative.     PHYSICAL EXAM:  Unable to assess due to encounter type.  ECOG = 1  0 - Asymptomatic (Fully active, able to carry on all predisease activities without restriction)  1 - Symptomatic but completely ambulatory (Restricted in physically strenuous activity but ambulatory and able to carry out work of a light or sedentary nature. For example, light housework, office work)  2 - Symptomatic, <50% in bed during the day (Ambulatory and capable of all self care but unable to carry out any work activities. Up and about more than 50% of waking hours)  3 - Symptomatic, >50% in bed, but not bedbound (Capable of only limited self-care, confined to bed or chair 50% or more of waking hours)  4 - Bedbound (Completely disabled. Cannot carry on any self-care. Totally confined to bed or chair)  5 - Death   Eustace Pen MM, Creech RH, Tormey DC, et al. [phone]). "Toxicity and response criteria of the [facility]". [place] Oncol. 5 (6): 649-55    LABORATORY DATA:  Lab Results  Component Value Date   WBC 3.1 (L) 10/16/2018   HGB 8.8 (L) 10/16/2018   HCT 26.0 (L) 10/16/2018   MCV 92.5 10/16/2018   PLT 187 10/16/2018   Lab Results  Component Value Date   NA 138 10/16/2018   K 3.8 10/16/2018   CL 104 10/16/2018   CO2 24 10/16/2018   Lab Results  Component Value Date   ALT 30  10/16/2018   AST 17 10/16/2018   ALKPHOS 93 10/16/2018   BILITOT 0.4 10/16/2018      RADIOGRAPHY: No results found.     IMPRESSION/PLAN: 1. 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 Dr. Lisbeth Renshaw discusses the pathology findings and reviews the nature of triple negative left breast disease. She has completed surgery and her adjuvant chemotherapy is now complete. She would benefit from adjuvant radiotherapy to the left breast to reduce the risk of local recurrence. We discussed the risks, benefits, short, and long term effects of radiotherapy, and the patient is interested in proceeding. Dr. Lisbeth Renshaw discusses the delivery and logistics of radiotherapy and anticipates a course of 6 1/2 weeks of radiotherapy with deep inspiration breath hold technique. We will ask staff to contact her to coordinate simulation in the next week or so and anticipate starting the first week of November 2020. 2. Chemotherapy induced peripheral neuropathy. Her symptoms are improving since her last infusion and she will continue to let Dr.  Magrinat know of any concerns she has regarding these symptoms.    Given current concerns for patient exposure during the COVID-19 pandemic, this encounter was conducted via telephone.  The patient has given verbal consent for this type of encounter. The time spent during this encounter was 35 minutes and 50% of that time was spent in the coordination of her care. The attendants for this meeting included Mardene Sayer, LPN, Dr. Lisbeth Renshaw,  Shona Simpson, [facility] and Haze Justin Cheek  During the Laurann Montana, LPN, Dr. Lisbeth Renshaw, and Shona Simpson [facility] were located at [facility] Radiation Oncology Department.  Charda Carmela Rima  was located at home.   The above documentation reflects my direct findings during this shared patient visit. Please see the separate note by Dr. Lisbeth Renshaw on this date for the remainder of the patient's plan of  care.    Carola Rhine, PAC

## 2018-10-21 ENCOUNTER — Ambulatory Visit
Admission: RE | Admit: 2018-10-21 | Discharge: 2018-10-21 | Disposition: A | Discharge status: Home / Self Care | Payer: Federal, State, Local not specified - PPO | Source: Ambulatory Visit | Attending: Radiation Oncology | Admitting: Radiation Oncology

## 2018-10-21 ENCOUNTER — Other Ambulatory Visit: Payer: Self-pay

## 2018-10-21 ENCOUNTER — Ambulatory Visit: Payer: Federal, State, Local not specified - PPO | Admitting: Radiation Oncology

## 2018-10-21 ENCOUNTER — Encounter: Payer: Self-pay | Admitting: Radiation Oncology

## 2018-10-21 DIAGNOSIS — Z171 Estrogen receptor negative status [ER-]: Secondary | ICD-10-CM | POA: Diagnosis not present

## 2018-10-21 DIAGNOSIS — G620 Drug-induced polyneuropathy: Secondary | ICD-10-CM | POA: Diagnosis not present

## 2018-10-21 DIAGNOSIS — C50212 Malignant neoplasm of upper-inner quadrant of left female breast: Secondary | ICD-10-CM | POA: Diagnosis not present

## 2018-10-21 DIAGNOSIS — T451X5A Adverse effect of antineoplastic and immunosuppressive drugs, initial encounter: Secondary | ICD-10-CM | POA: Diagnosis not present

## 2018-10-21 NOTE — Addendum Note (Signed)
Encounter addended by: Hayden Pedro, PA-C on: 10/21/2018 10:48 AM  Actions taken: Clinical Note Signed

## 2018-10-22 ENCOUNTER — Encounter: Payer: Self-pay | Admitting: *Deleted

## 2018-10-22 ENCOUNTER — Telehealth: Payer: Self-pay | Admitting: Physician Assistant

## 2018-10-22 NOTE — Telephone Encounter (Signed)
Scheduled appt per 10/9 los - unable to reach pt /left message for patient with appt date and time

## 2018-10-23 ENCOUNTER — Other Ambulatory Visit: Payer: Federal, State, Local not specified - PPO

## 2018-10-23 ENCOUNTER — Ambulatory Visit: Payer: Federal, State, Local not specified - PPO

## 2018-10-23 ENCOUNTER — Ambulatory Visit: Payer: Federal, State, Local not specified - PPO | Admitting: Adult Health

## 2018-10-30 ENCOUNTER — Ambulatory Visit
Admission: RE | Admit: 2018-10-30 | Discharge: 2018-10-30 | Disposition: A | Discharge status: Home / Self Care | Payer: Federal, State, Local not specified - PPO | Source: Ambulatory Visit | Attending: Radiation Oncology | Admitting: Radiation Oncology

## 2018-10-30 ENCOUNTER — Other Ambulatory Visit: Payer: Federal, State, Local not specified - PPO

## 2018-10-30 ENCOUNTER — Inpatient Hospital Stay (HOSPITAL_BASED_OUTPATIENT_CLINIC_OR_DEPARTMENT_OTHER): Payer: Federal, State, Local not specified - PPO | Admitting: Physician Assistant

## 2018-10-30 ENCOUNTER — Other Ambulatory Visit: Payer: Self-pay | Admitting: Oncology

## 2018-10-30 ENCOUNTER — Telehealth: Payer: Self-pay | Admitting: Physician Assistant

## 2018-10-30 ENCOUNTER — Ambulatory Visit: Payer: Federal, State, Local not specified - PPO

## 2018-10-30 ENCOUNTER — Inpatient Hospital Stay: Payer: Federal, State, Local not specified - PPO

## 2018-10-30 ENCOUNTER — Other Ambulatory Visit: Payer: Self-pay

## 2018-10-30 VITALS — BP 155/92 | HR 91 | Temp 98.2°F | Resp 18 | Ht 69.0 in | Wt 269.3 lb

## 2018-10-30 DIAGNOSIS — Z170 Estrogen receptor positive status [ER+]: Secondary | ICD-10-CM

## 2018-10-30 DIAGNOSIS — Z171 Estrogen receptor negative status [ER-]: Secondary | ICD-10-CM | POA: Diagnosis not present

## 2018-10-30 DIAGNOSIS — Z7982 Long term (current) use of aspirin: Secondary | ICD-10-CM | POA: Diagnosis not present

## 2018-10-30 DIAGNOSIS — C50212 Malignant neoplasm of upper-inner quadrant of left female breast: Secondary | ICD-10-CM

## 2018-10-30 DIAGNOSIS — G6289 Other specified polyneuropathies: Secondary | ICD-10-CM

## 2018-10-30 DIAGNOSIS — E1140 Type 2 diabetes mellitus with diabetic neuropathy, unspecified: Secondary | ICD-10-CM | POA: Diagnosis not present

## 2018-10-30 DIAGNOSIS — Z5111 Encounter for antineoplastic chemotherapy: Secondary | ICD-10-CM | POA: Diagnosis not present

## 2018-10-30 DIAGNOSIS — I10 Essential (primary) hypertension: Secondary | ICD-10-CM | POA: Diagnosis not present

## 2018-10-30 DIAGNOSIS — Z90722 Acquired absence of ovaries, bilateral: Secondary | ICD-10-CM | POA: Diagnosis not present

## 2018-10-30 DIAGNOSIS — Z7984 Long term (current) use of oral hypoglycemic drugs: Secondary | ICD-10-CM | POA: Diagnosis not present

## 2018-10-30 DIAGNOSIS — Z79899 Other long term (current) drug therapy: Secondary | ICD-10-CM | POA: Diagnosis not present

## 2018-10-30 LAB — CBC WITH DIFFERENTIAL/PLATELET
Abs Immature Granulocytes: 0.01 10*3/uL (ref 0.00–0.07)
Basophils Absolute: 0 10*3/uL (ref 0.0–0.1)
Basophils Relative: 0 %
Eosinophils Absolute: 0 10*3/uL (ref 0.0–0.5)
Eosinophils Relative: 2 %
HCT: 26.9 % — ABNORMAL LOW (ref 36.0–46.0)
Hemoglobin: 9.1 g/dL — ABNORMAL LOW (ref 12.0–15.0)
Immature Granulocytes: 0 %
Lymphocytes Relative: 27 %
Lymphs Abs: 0.6 10*3/uL — ABNORMAL LOW (ref 0.7–4.0)
MCH: 32.4 pg (ref 26.0–34.0)
MCHC: 33.8 g/dL (ref 30.0–36.0)
MCV: 95.7 fL (ref 80.0–100.0)
Monocytes Absolute: 0.4 10*3/uL (ref 0.1–1.0)
Monocytes Relative: 17 %
Neutro Abs: 1.3 10*3/uL — ABNORMAL LOW (ref 1.7–7.7)
Neutrophils Relative %: 54 %
Platelets: 241 10*3/uL (ref 150–400)
RBC: 2.81 MIL/uL — ABNORMAL LOW (ref 3.87–5.11)
RDW: 17.1 % — ABNORMAL HIGH (ref 11.5–15.5)
WBC: 2.3 10*3/uL — ABNORMAL LOW (ref 4.0–10.5)
nRBC: 0 % (ref 0.0–0.2)

## 2018-10-30 LAB — COMPREHENSIVE METABOLIC PANEL
ALT: 26 U/L (ref 0–44)
AST: 18 U/L (ref 15–41)
Albumin: 3.8 g/dL (ref 3.5–5.0)
Alkaline Phosphatase: 101 U/L (ref 38–126)
Anion gap: 10 (ref 5–15)
BUN: 8 mg/dL (ref 6–20)
CO2: 24 mmol/L (ref 22–32)
Calcium: 9.2 mg/dL (ref 8.9–10.3)
Chloride: 106 mmol/L (ref 98–111)
Creatinine, Ser: 0.82 mg/dL (ref 0.44–1.00)
GFR calc Af Amer: >60 mL/min (ref >60)
GFR calc non Af Amer: >60 mL/min (ref >60)
Glucose, Bld: 140 mg/dL — ABNORMAL HIGH (ref 70–99)
Potassium: 3.9 mmol/L (ref 3.5–5.1)
Sodium: 140 mmol/L (ref 135–145)
Total Bilirubin: 0.4 mg/dL (ref 0.3–1.2)
Total Protein: 7 g/dL (ref 6.5–8.1)

## 2018-10-30 NOTE — Progress Notes (Signed)
  Radiation Oncology         [phone]) [phone] ________________________________  Name: Cassandra Sanders MRN: [id number]  Date: 10/30/2018  DOB: 01-Apr-1965  DIAGNOSIS:     ICD-10-CM   1. Malignant neoplasm of upper-inner quadrant of left breast in female, estrogen receptor negative ([place])  C50.212    Z17.1      SIMULATION AND TREATMENT PLANNING NOTE  The patient presented for simulation prior to beginning her course of radiation treatment for her diagnosis of left-sided breast cancer. The patient was placed in a supine position on a breast board. A customized vac-lock bag was constructed and this complex treatment device will be used on a daily basis during her treatment. In this fashion, a CT scan was obtained through the chest area and an isocenter was placed near the chest wall within the breast.  The patient will be planned to receive a course of radiation initially to a dose of 50.4 Gy. This will consist of a whole breast radiotherapy technique. To accomplish this, 2 customized blocks have been designed which will correspond to medial and lateral whole breast tangent fields. This treatment will be accomplished at 1.8 Gy per fraction. A forward planning technique will also be evaluated to determine if this approach improves the plan. It is anticipated that the patient will then receive a 10 Gy boost to the seroma cavity which has been contoured. This will be accomplished at 2 Gy per fraction.   This initial treatment will consist of a 3-D conformal technique. The seroma has been contoured as the primary target structure. Additionally, dose volume histograms of both this target as well as the lungs and heart will also be evaluated. Such an approach is necessary to ensure that the target area is adequately covered while the nearby critical  normal structures are adequately spared.  Plan:  The final anticipated total dose therefore will correspond to 60.4 Gy.  Special treatment procedure was  performed today due to the extra time and effort required by myself to plan and prepare this patient for deep inspiration breath hold technique.  I have determined cardiac sparing to be of benefit to this patient to prevent long term cardiac damage due to radiation of the heart.  Bellows were placed on the patient's abdomen. To facilitate cardiac sparing, the patient was coached by the radiation therapists on breath hold techniques and breathing practice was performed. Practice waveforms were obtained. The patient was then scanned while maintaining breath hold in the treatment position.  This image was then transferred over to the imaging specialist. The imaging specialist then created a fusion of the free breathing and breath hold scans using the chest wall as the stable structure. I personally reviewed the fusion in axial, coronal and sagittal image planes.  Excellent cardiac sparing was obtained.  I felt the patient is an appropriate candidate for breath hold and the patient will be treated as such.  The image fusion was then reviewed with the patient to reinforce the necessity of reproducible breath hold.     _______________________________   Jodelle Gross, MD, PhD

## 2018-10-30 NOTE — Telephone Encounter (Signed)
I talk with patient regarding schedule  

## 2018-10-30 NOTE — Progress Notes (Signed)
  Radiation Oncology         [phone]) [phone] ________________________________  Name: Cassandra Sanders MRN: [id number]  Date: 10/30/2018  DOB: 05-Jan-1966  Optical Surface Tracking Plan:  Since intensity modulated radiotherapy (IMRT) and 3D conformal radiation treatment methods are predicated on accurate and precise positioning for treatment, intrafraction motion monitoring is medically necessary to ensure accurate and safe treatment delivery.  The ability to quantify intrafraction motion without excessive ionizing radiation dose can only be performed with optical surface tracking. Accordingly, surface imaging offers the opportunity to obtain 3D measurements of patient position throughout IMRT and 3D treatments without excessive radiation exposure.  I am ordering optical surface tracking for this patient's upcoming course of radiotherapy. ________________________________  Kyung Rudd, MD 10/30/2018 4:40 PM    Reference:   Particia Jasper, et al. Surface imaging-based analysis of intrafraction motion for breast radiotherapy patients.Journal of [place], n. 6, nov. 2014. ISSN [id number].   Available at: <http://www.jacmp.org/index.php/jacmp/article/view/4957>.

## 2018-10-30 NOTE — Progress Notes (Signed)
[place] [phone] [phone]    DH:RCBUL [name]: 12-24-1967MR#: [id number] HOZ#:[id number]  Patient Care Team: Lennie Odor, PA-C as PCP - General (Nurse Practitioner) Rockwell Germany, RN as Oncology Nurse Navigator Mauro Kaufmann, RN as Oncology Nurse Navigator Magrinat, Virgie Dad, MD as Consulting Physician (Oncology) Rolm Bookbinder, MD as Consulting Physician (General Surgery) Kyung Rudd, MD as Consulting Physician (Radiation Oncology) Marylynn Pearson, MD as Consulting Physician (Obstetrics and Gynecology) Chauncey Cruel, MD OTHER MD:  CHIEF COMPLAINT: Triple negative breast cancer  CURRENT TREATMENT:Adjuvant radiation beginning 11/09/2018  INTERVAL HISTORY: Cassandra Sanders for a follow-up and treatment of her triple negative breast cancer. She was last seen here on10/09/2018 by myself and Dr. Jana Hakim.   She completedadjuvant chemotherapy. She completed 4 cycles ofdose dense doxorubicin and cyclophosphamidegivenevery 14 days. She received 4 weekly doses of carboplatin and paclitaxel. This was discontinued after 4 cycles secondary to the development of peripheral neuropathy. Her last chemotherapy was 10/09/2018. She will not proceed with the adjuvant radiation phase of her treatment. She had her CT simulation today and is expected to receive her last radiation treatment on 12/25/2018.  REVIEW OF SYSTEMS: Cassandra Sanders is doing well today. The purpose of this visit is for evaluation, specifically of her peripheral neuropathy. She started developing peripheral neuropathy in her fingers and toes which became more noticable after her treatment on 10/09/2018. The patient is very active physically and enjoys walking. 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 During these activities, she started feeling numbness in the tips of her fingers and toes. Her treatment was discontinued to prevent  this from resulting in potentially disabling neuropathy. Since discontinuing her treatment, her numbness and tingling is improving. It is no longer persistent and she states that she notices it "0.2% of the time".   She denies any fever, chills, chest pain, palpitations, cough, shortness of breath, headaches, nausea, vomiting, bowel/bladder changes. A detailed ROS was otherwise non contributory.    HISTORY OF CURRENT ILLNESS: From the original intake note:  Cassandra Sanders had routine screening mammography on 05/15/2018 at [facility] showing a possible abnormality in the left breast. She underwent bilateral diagnostic mammography with tomography and left breast ultrasonography at [facility] on 06/02/2018 showing: breast density category B; suspicious approximate 0.9 cm mass involving the upper inner quadrant of the left breast at posterior depth which account for the screening mammographic finding; no pathologic left axillary lymphadenopathy.  Accordingly on 06/04/2018 she proceeded to biopsy of the left breast area in question. The pathology from this procedure ([id number]) showed: invasive ductal carcinoma, grade 3. Prognostic indicators significant for: estrogen receptor, 60% positive with moderate staining intensity and progesterone receptor, 0% negative. Proliferation marker Ki67 at 80%. HER2 negative by immunohistochemistry (0).  The patient's subsequent history is as detailed below. MEDICAL HISTORY: Past Medical History:  Diagnosis Date   Breast cancer ([place]) 06/04/2018   left breast   Diabetes mellitus without complication ([place])    Hypertension     ALLERGIES:  has No Known Allergies.  MEDICATIONS:  Current Outpatient Medications  Medication Sig Dispense Refill   acetaminophen-codeine (TYLENOL #3) 300-30 MG tablet      aspirin EC 81 MG tablet Take 81 mg by mouth daily.     ibuprofen (ADVIL) 800 MG tablet      JANUVIA 100 MG tablet daily.       lisinopril (PRINIVIL,ZESTRIL) 10 MG tablet Take 10 mg by mouth daily.  2  loratadine (CLARITIN) 10 MG tablet Take 1 tablet (10 mg total) by mouth daily. 90 tablet 1   METFORMIN HCL PO Take 1,000 mg by mouth daily.      omeprazole (PRILOSEC) 40 MG capsule TAKE 1 CAPSULE BY MOUTH EVERY DAY 30 capsule 0   No current facility-administered medications for this visit.     SURGICAL HISTORY:  Past Surgical History:  Procedure Laterality Date   BREAST LUMPECTOMY WITH RADIOACTIVE SEED AND SENTINEL LYMPH NODE BIOPSY Left 06/26/2018   Procedure: LEFT BREAST LUMPECTOMY WITH RADIOACTIVE SEED AND LEFT AXILLARY SENTINEL LYMPH NODE BIOPSY;  Surgeon: Rolm Bookbinder, MD;  Location: [place];  Service: General;  Laterality: Left;   PORTACATH PLACEMENT Right 06/26/2018   Procedure: INSERTION PORT-A-CATH WITH ULTRASOUND;  Surgeon: Rolm Bookbinder, MD;  Location: [place];  Service: General;  Laterality: Right;    REVIEW OF SYSTEMS:   Review of Systems  Constitutional: Negative for appetite change, chills, fatigue, fever and unexpected weight change.  HENT: Negative for mouth sores, nosebleeds, sore throat and trouble swallowing.   Eyes: Negative for eye problems and icterus.  Respiratory: Negative for cough, hemoptysis, shortness of breath and wheezing.   Cardiovascular: Negative for chest pain and leg swelling.  Gastrointestinal: Negative for abdominal pain, constipation, diarrhea, nausea and vomiting.  Genitourinary: Negative for bladder incontinence, difficulty urinating, dysuria, frequency and hematuria.   Musculoskeletal: Negative for back pain, gait problem, neck pain and neck stiffness.  Skin: Negative for itching and rash.  Neurological: Positive for improving mild peripheral neuropathy bilaterally in her fingers and toes. Negative for dizziness, extremity weakness, gait problem, headaches, light-headedness and seizures.  Hematological: Negative for  adenopathy. Does not bruise/bleed easily.  Psychiatric/Behavioral: Negative for confusion, depression and sleep disturbance. The patient is not nervous/anxious.     PHYSICAL EXAMINATION:  Blood pressure (!) 155/92, pulse 91, temperature 98.2 F (36.8 C), temperature source Temporal, resp. rate 18, height _0  (1.753 m), weight 269 lb 4.8 oz (122.2 kg), last menstrual period 06/18/2013, SpO2 100 %.  ECOG PERFORMANCE STATUS: 0 - Asymptomatic  Physical Exam  Constitutional: Oriented to person, place, and time and well-developed, well-nourished, and in no distress.  HENT:  Head: Normocephalic and atraumatic.  Mouth/Throat: Oropharynx is clear and moist. No oropharyngeal exudate.  Eyes: Conjunctivae are normal. Right eye exhibits no discharge. Left eye exhibits no discharge. No scleral icterus.  Neck: Normal range of motion. Neck supple.  Cardiovascular: Normal rate, regular rhythm, normal heart sounds and intact distal pulses.   Pulmonary/Chest: Effort normal and breath sounds normal. No respiratory distress. No wheezes. No rales.  Abdominal: Soft. Bowel sounds are normal. Exhibits no distension and no mass. There is no tenderness.  Musculoskeletal: Normal range of motion. Exhibits no edema.  Lymphadenopathy:    No cervical adenopathy.  Neurological: Alert and oriented to person, place, and time. Exhibits normal muscle tone. Gait normal. Coordination normal.  Skin: Skin is warm and dry. No rash noted. Not diaphoretic. No erythema. No pallor.  Psychiatric: Mood, memory and judgment normal.  Vitals reviewed.  LABORATORY DATA: Lab Results  Component Value Date   WBC 2.3 (L) 10/30/2018   HGB 9.1 (L) 10/30/2018   HCT 26.9 (L) 10/30/2018   MCV 95.7 10/30/2018   PLT 241 10/30/2018      Chemistry      Component Value Date/Time   NA 140 10/30/2018 1146   K 3.9 10/30/2018 1146   CL 106 10/30/2018 1146   CO2 24 10/30/2018 1146  BUN 8 10/30/2018 1146   CREATININE 0.82 10/30/2018 1146     CREATININE 1.14 (H) 06/10/2018 0859      Component Value Date/Time   CALCIUM 9.2 10/30/2018 1146   ALKPHOS 101 10/30/2018 1146   AST 18 10/30/2018 1146   AST 13 (L) 06/10/2018 0859   ALT 26 10/30/2018 1146   ALT 17 06/10/2018 0859   BILITOT 0.4 10/30/2018 1146   BILITOT 0.3 06/10/2018 0859       RADIOGRAPHIC STUDIES:  No results found.   ASSESSMENT/PLAN:  ASSESSMENT/PLAN:  ELIGIBLE FOR AVAILABLE RESEARCH PROTOCOL: no  ASSESSMENT:53 y.o.[place] woman status post left breast upper inner quadrant biopsy 06/04/2018 for a clinical T1b N0, stage IB invasive ductal carcinoma, grade 3, estrogen receptor moderately positive, progesterone receptor negative, with an MIB-1 of 80%, and HER-2 nonamplified  (1) left lumpectomy and sentinel lymph node sampling 06/26/2018 showed apT1c pN0, stage IB invasive ductal carcinoma, grade 3, with negative margins (a) repeat prognostic paneltriple negative (b) a total of 3 sentinel lymph nodes were removed  (2) Oncotype score of 56 obtained from the 06/04/2018 biopsy predicts a risk of outside the breast recurrence over the next 9 years of nearly 40% if the patient's only systemic therapy is antiestrogens for 5 years. It also predicts a significant benefit from chemotherapy. (a) Oncotype reads the tumor also as triple negative.  (3) adjuvant chemo therapy will consist of cyclophosphamide and doxorubicin in dose dense fashion x4 starting 07/17/2018, followed by weekly paclitaxel and carboplatin x12 starting on 09/18/2018 (a) echo on 07/16/2018 shows EF of 60-65%             (b) platinum/paclitaxel discontinued after 10/09/2018 due to the development of neuropathy. She received 4 of her 12 planned doses.  (4) adjuvant radiation to follow chemotherapy. Expected to undergo treatment from 11/09/2018-12/25/2018.  PLAN: Aliceis doing well today and her peripheral neuropathy is improving. She states  that it is not as noticeable and is mild at this point. It is not interfering with any activities of her daily living. The patient had her CT simulation earlier today. She is expected to begin her adjuvant radiation treatment on 11/09/2018. Her last treatment is expected to be on 12/25/2018.   Dr. Jana Hakim will see the patient back for a follow up visit during the week of 12/21/2018, which is around when she is expected to complete her radiation treatment.   Labs were reviewed with the patient today. Discussed neutropenic precautions with the patient.   I will arrange for the patient to have port-a-cath flush appointments every 6 weeks. Next port-a-cath flush should be around 11/27/2018.  The patient was advised to call immediately if she has any concerning symptoms in the interval. The patient voices understanding of current disease status and treatment options and is in agreement with the current care plan. All questions were answered. The patient knows to call the clinic with any problems, questions or concerns. We can certainly see the patient much sooner if necessary  No orders of the defined types were placed in this encounter.    Sharif Rendell L Ixel Boehning, PA-C 10/30/18

## 2018-11-03 DIAGNOSIS — Z171 Estrogen receptor negative status [ER-]: Secondary | ICD-10-CM | POA: Diagnosis not present

## 2018-11-03 DIAGNOSIS — C50212 Malignant neoplasm of upper-inner quadrant of left female breast: Secondary | ICD-10-CM | POA: Diagnosis not present

## 2018-11-06 ENCOUNTER — Other Ambulatory Visit: Payer: Federal, State, Local not specified - PPO

## 2018-11-06 ENCOUNTER — Ambulatory Visit: Payer: Federal, State, Local not specified - PPO

## 2018-11-06 ENCOUNTER — Ambulatory Visit: Payer: Federal, State, Local not specified - PPO | Admitting: Adult Health

## 2018-11-09 ENCOUNTER — Ambulatory Visit
Admission: RE | Admit: 2018-11-09 | Discharge: 2018-11-09 | Disposition: A | Discharge status: Home / Self Care | Payer: Federal, State, Local not specified - PPO | Source: Ambulatory Visit | Attending: Radiation Oncology | Admitting: Radiation Oncology

## 2018-11-09 ENCOUNTER — Other Ambulatory Visit: Payer: Self-pay

## 2018-11-09 DIAGNOSIS — C50212 Malignant neoplasm of upper-inner quadrant of left female breast: Secondary | ICD-10-CM | POA: Insufficient documentation

## 2018-11-09 DIAGNOSIS — Z510 Encounter for antineoplastic radiation therapy: Secondary | ICD-10-CM | POA: Insufficient documentation

## 2018-11-09 DIAGNOSIS — Z171 Estrogen receptor negative status [ER-]: Secondary | ICD-10-CM | POA: Insufficient documentation

## 2018-11-10 ENCOUNTER — Ambulatory Visit
Admission: RE | Admit: 2018-11-10 | Discharge: 2018-11-10 | Disposition: A | Discharge status: Home / Self Care | Payer: Federal, State, Local not specified - PPO | Source: Ambulatory Visit | Attending: Radiation Oncology | Admitting: Radiation Oncology

## 2018-11-10 ENCOUNTER — Other Ambulatory Visit: Payer: Self-pay

## 2018-11-10 DIAGNOSIS — C50212 Malignant neoplasm of upper-inner quadrant of left female breast: Secondary | ICD-10-CM | POA: Diagnosis not present

## 2018-11-10 DIAGNOSIS — Z171 Estrogen receptor negative status [ER-]: Secondary | ICD-10-CM | POA: Diagnosis not present

## 2018-11-10 DIAGNOSIS — Z510 Encounter for antineoplastic radiation therapy: Secondary | ICD-10-CM | POA: Diagnosis not present

## 2018-11-11 ENCOUNTER — Other Ambulatory Visit: Payer: Self-pay

## 2018-11-11 ENCOUNTER — Ambulatory Visit
Admission: RE | Admit: 2018-11-11 | Discharge: 2018-11-11 | Disposition: A | Discharge status: Home / Self Care | Payer: Federal, State, Local not specified - PPO | Source: Ambulatory Visit | Attending: Radiation Oncology | Admitting: Radiation Oncology

## 2018-11-11 DIAGNOSIS — C50212 Malignant neoplasm of upper-inner quadrant of left female breast: Secondary | ICD-10-CM | POA: Diagnosis not present

## 2018-11-11 DIAGNOSIS — Z171 Estrogen receptor negative status [ER-]: Secondary | ICD-10-CM

## 2018-11-11 DIAGNOSIS — Z510 Encounter for antineoplastic radiation therapy: Secondary | ICD-10-CM | POA: Diagnosis not present

## 2018-11-11 MED ORDER — ALRA NON-METALLIC DEODORANT (RAD-ONC)
1.0000 "application " | Freq: Once | TOPICAL | Status: AC
  Administered 2018-11-11: 1 via TOPICAL

## 2018-11-11 MED ORDER — RADIAPLEXRX EX GEL
Freq: Once | CUTANEOUS | Status: AC
  Administered 2018-11-11: 12:00:00 via TOPICAL

## 2018-11-12 ENCOUNTER — Other Ambulatory Visit: Payer: Self-pay

## 2018-11-12 ENCOUNTER — Ambulatory Visit
Admission: RE | Admit: 2018-11-12 | Discharge: 2018-11-12 | Disposition: A | Discharge status: Home / Self Care | Payer: Federal, State, Local not specified - PPO | Source: Ambulatory Visit | Attending: Radiation Oncology | Admitting: Radiation Oncology

## 2018-11-12 DIAGNOSIS — C50212 Malignant neoplasm of upper-inner quadrant of left female breast: Secondary | ICD-10-CM | POA: Diagnosis not present

## 2018-11-12 DIAGNOSIS — Z171 Estrogen receptor negative status [ER-]: Secondary | ICD-10-CM | POA: Diagnosis not present

## 2018-11-12 DIAGNOSIS — Z510 Encounter for antineoplastic radiation therapy: Secondary | ICD-10-CM | POA: Diagnosis not present

## 2018-11-13 ENCOUNTER — Ambulatory Visit: Payer: Federal, State, Local not specified - PPO | Admitting: Oncology

## 2018-11-13 ENCOUNTER — Other Ambulatory Visit: Payer: Federal, State, Local not specified - PPO

## 2018-11-13 ENCOUNTER — Ambulatory Visit
Admission: RE | Admit: 2018-11-13 | Discharge: 2018-11-13 | Disposition: A | Discharge status: Home / Self Care | Payer: Federal, State, Local not specified - PPO | Source: Ambulatory Visit | Attending: Radiation Oncology | Admitting: Radiation Oncology

## 2018-11-13 ENCOUNTER — Ambulatory Visit: Payer: Federal, State, Local not specified - PPO

## 2018-11-13 ENCOUNTER — Other Ambulatory Visit: Payer: Self-pay

## 2018-11-13 DIAGNOSIS — Z171 Estrogen receptor negative status [ER-]: Secondary | ICD-10-CM | POA: Diagnosis not present

## 2018-11-13 DIAGNOSIS — Z510 Encounter for antineoplastic radiation therapy: Secondary | ICD-10-CM | POA: Diagnosis not present

## 2018-11-13 DIAGNOSIS — C50212 Malignant neoplasm of upper-inner quadrant of left female breast: Secondary | ICD-10-CM | POA: Diagnosis not present

## 2018-11-16 ENCOUNTER — Ambulatory Visit
Admission: RE | Admit: 2018-11-16 | Discharge: 2018-11-16 | Disposition: A | Discharge status: Home / Self Care | Payer: Federal, State, Local not specified - PPO | Source: Ambulatory Visit | Attending: Radiation Oncology | Admitting: Radiation Oncology

## 2018-11-16 ENCOUNTER — Other Ambulatory Visit: Payer: Self-pay

## 2018-11-16 ENCOUNTER — Encounter: Payer: Self-pay | Admitting: *Deleted

## 2018-11-16 DIAGNOSIS — C50212 Malignant neoplasm of upper-inner quadrant of left female breast: Secondary | ICD-10-CM | POA: Diagnosis not present

## 2018-11-16 DIAGNOSIS — Z171 Estrogen receptor negative status [ER-]: Secondary | ICD-10-CM | POA: Diagnosis not present

## 2018-11-16 DIAGNOSIS — Z510 Encounter for antineoplastic radiation therapy: Secondary | ICD-10-CM | POA: Diagnosis not present

## 2018-11-17 ENCOUNTER — Other Ambulatory Visit: Payer: Self-pay

## 2018-11-17 ENCOUNTER — Ambulatory Visit
Admission: RE | Admit: 2018-11-17 | Discharge: 2018-11-17 | Disposition: A | Discharge status: Home / Self Care | Payer: Federal, State, Local not specified - PPO | Source: Ambulatory Visit | Attending: Radiation Oncology | Admitting: Radiation Oncology

## 2018-11-17 DIAGNOSIS — Z171 Estrogen receptor negative status [ER-]: Secondary | ICD-10-CM | POA: Diagnosis not present

## 2018-11-17 DIAGNOSIS — Z510 Encounter for antineoplastic radiation therapy: Secondary | ICD-10-CM | POA: Diagnosis not present

## 2018-11-17 DIAGNOSIS — C50212 Malignant neoplasm of upper-inner quadrant of left female breast: Secondary | ICD-10-CM | POA: Diagnosis not present

## 2018-11-18 ENCOUNTER — Ambulatory Visit
Admission: RE | Admit: 2018-11-18 | Discharge: 2018-11-18 | Disposition: A | Discharge status: Home / Self Care | Payer: Federal, State, Local not specified - PPO | Source: Ambulatory Visit | Attending: Radiation Oncology | Admitting: Radiation Oncology

## 2018-11-18 ENCOUNTER — Other Ambulatory Visit: Payer: Self-pay

## 2018-11-18 DIAGNOSIS — Z510 Encounter for antineoplastic radiation therapy: Secondary | ICD-10-CM | POA: Diagnosis not present

## 2018-11-18 DIAGNOSIS — C50212 Malignant neoplasm of upper-inner quadrant of left female breast: Secondary | ICD-10-CM | POA: Diagnosis not present

## 2018-11-18 DIAGNOSIS — Z171 Estrogen receptor negative status [ER-]: Secondary | ICD-10-CM | POA: Diagnosis not present

## 2018-11-18 NOTE — Progress Notes (Signed)
[place]  [phone] [phone]     ID: Cassandra Sanders DOB: Jun 30, 1965  MR#: [id number]  NLG#:[id number]  Patient Care Team: Cassandra Odor, PA-C as PCP - General (Nurse Practitioner) Cassandra Germany, RN as Oncology Nurse Navigator Cassandra Kaufmann, RN as Oncology Nurse Navigator Cassandra Sanders, Cassandra Dad, MD as Consulting Physician (Oncology) Cassandra Bookbinder, MD as Consulting Physician (General Surgery) Kyung Rudd, MD as Consulting Physician (Radiation Oncology) Cassandra Pearson, MD as Consulting Physician (Obstetrics and Gynecology) OTHER MD:  CHIEF COMPLAINT: Triple negative breast cancer  CURRENT TREATMENT: Adjuvant radiation therapy   INTERVAL HISTORY: Cassandra Sanders returns today for a follow-up and treatment of her triple negative breast cancer.  She has recovered nicely from radiation and is currently back to work full-time.  After work she walks about a mile for extra exercise.  She is currently undergoing radiation therapy under Dr. Lisbeth Sanders, which she began on 11/09/2018. She is expected to finish on 12/25/2018.   REVIEW OF SYSTEMS: Cassandra Sanders denies unusual headaches visual changes cough phlegm production pleurisy shortness of breath or change in bowel or bladder habits.  She is taking appropriate pandemic precautions at work and at home.  A detailed review of systems today was otherwise stable.   HISTORY OF CURRENT ILLNESS: From the original intake note:  Cassandra Sanders had routine screening mammography on 05/15/2018 at [facility] showing a possible abnormality in the left breast. She underwent bilateral diagnostic mammography with tomography and left breast ultrasonography at [facility] on 06/02/2018 showing: breast density category B; suspicious approximate 0.9 cm mass involving the upper inner quadrant of the left breast at posterior depth which account for the screening mammographic finding; no pathologic left  axillary lymphadenopathy.  Accordingly on 06/04/2018 she proceeded to biopsy of the left breast area in question. The pathology from this procedure ([id number]) showed: invasive ductal carcinoma, grade 3. Prognostic indicators significant for: estrogen receptor, 60% positive with moderate staining intensity and progesterone receptor, 0% negative. Proliferation marker Ki67 at 80%. HER2 negative by immunohistochemistry (0).  The patient's subsequent history is as detailed below.  MEDICAL HISTORY: Past Medical History:  Diagnosis Date  . Breast cancer ([place]) 06/04/2018   left breast  . Diabetes mellitus without complication ([place])   . Hypertension     SURGICAL HISTORY: Past Surgical History:  Procedure Laterality Date  . BREAST LUMPECTOMY WITH RADIOACTIVE SEED AND SENTINEL LYMPH NODE BIOPSY Left 06/26/2018   Procedure: LEFT BREAST LUMPECTOMY WITH RADIOACTIVE SEED AND LEFT AXILLARY SENTINEL LYMPH NODE BIOPSY;  Surgeon: Cassandra Bookbinder, MD;  Location: [place];  Service: General;  Laterality: Left;  . PORTACATH PLACEMENT Right 06/26/2018   Procedure: INSERTION PORT-A-CATH WITH ULTRASOUND;  Surgeon: Cassandra Bookbinder, MD;  Location: [place];  Service: General;  Laterality: Right;    FAMILY HISTORY: No family history on file. Patient's father was in his 80s when he was murdered. Patient's mother is currently (as of 06/2018) at age 53. The patient denies a family hx of breast or ovarian cancer. She has 3 brothers and no biological sisters.   GYNECOLOGIC HISTORY:  Menarche: 53 years old Age at first live birth: 53 years old [place] P 1 LMP June 2015 Contraceptive used from age [age] with no complications HRT none Hysterectomy? no BSO? 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 She is engaged to [facility]. She lives at home with her son  Cassandra Sanders, age 52, who works  here in [place] as a Museum/gallery exhibitions officer.  The patient attends [employer].              ADVANCED DIRECTIVES: not in place; at the 06/10/2018 visit the patient was given the appropriate documents to complete and notarize at her discretion.  She intends to name her son is her healthcare power of attorney   HEALTH MAINTENANCE:  Social History   Tobacco Use  . Smoking status: Never Smoker  . Smokeless tobacco: Never Used  Substance Use Topics  . Alcohol use: No  . Drug use: No    No Known Allergies   Current Outpatient Medications  Medication Sig Dispense Refill  . aspirin EC 81 MG tablet Take 81 mg by mouth daily.    Marland Kitchen JANUVIA 100 MG tablet daily.     Marland Kitchen lisinopril (PRINIVIL,ZESTRIL) 10 MG tablet Take 10 mg by mouth daily.  2  . METFORMIN HCL PO Take 1,000 mg by mouth daily.     Marland Kitchen omeprazole (PRILOSEC) 40 MG capsule TAKE 1 CAPSULE BY MOUTH EVERY DAY 30 capsule 0   No current facility-administered medications for this visit.      PHYSICAL EXAMINATION: Morbidly obese African-American woman in no acute distress Vitals:   11/19/18 0902  BP: (!) 163/86  Pulse: 80  Resp: 18  Temp: 98.3 F (36.8 C)  SpO2: 100%  Body mass index is 39.65 kg/m. Filed Weights   11/19/18 0902  Weight: 268 lb 8 oz (121.8 kg)     ECOG PERFORMANCE STATUS: 1 - Symptomatic but completely ambulatory  Sclerae unicteric, EOMs intact Wearing a mask No cervical or supraclavicular adenopathy Lungs no rales or rhonchi Heart regular rate and rhythm Abd soft, nontender, positive bowel sounds MSK no focal spinal tenderness, no upper extremity lymphedema Neuro: nonfocal, well oriented, appropriate affect Breasts: The right breast is unremarkable.  The left breast is status post lumpectomy and is currently receiving radiation.  There is minimal hyperpigmentation.  The cosmetic result is very good.  There is no evidence of disease recurrence.  Both axillae are benign.    LABORATORY DATA:  Lab Results  Component Value Date   WBC 2.6 (L) 11/19/2018   HGB 10.2 (L) 11/19/2018   HCT 30.6 (L) 11/19/2018   MCV 97.8 11/19/2018   PLT 322 11/19/2018      Chemistry      Component Value Date/Time   NA 140 10/30/2018 1146   K 3.9 10/30/2018 1146   CL 106 10/30/2018 1146   CO2 24 10/30/2018 1146   BUN 8 10/30/2018 1146   CREATININE 0.82 10/30/2018 1146   CREATININE 1.14 (H) 06/10/2018 0859      Component Value Date/Time   CALCIUM 9.2 10/30/2018 1146   ALKPHOS 101 10/30/2018 1146   AST 18 10/30/2018 1146   AST 13 (L) 06/10/2018 0859   ALT 26 10/30/2018 1146   ALT 17 06/10/2018 0859   BILITOT 0.4 10/30/2018 1146   BILITOT 0.3 06/10/2018 0859       RADIOGRAPHIC STUDIES:  No results found.   ELIGIBLE FOR AVAILABLE RESEARCH PROTOCOL: no  ASSESSMENT: 53 y.o. [place] woman status post left breast upper inner quadrant biopsy 06/04/2018 for a clinical T1b N0, stage IB invasive ductal carcinoma, grade 3, estrogen receptor moderately positive, progesterone receptor negative, with an MIB-1 of 80%, and HER-2 nonamplified  (1) left lumpectomy and sentinel lymph node sampling 06/26/2018 showed a pT1c pN0, stage IB invasive ductal carcinoma, grade  3, with negative margins             (a) repeat prognostic panel triple negative             (b) a total of 3 sentinel lymph nodes were removed  (2) Oncotype score of 56 obtained from the 06/04/2018 biopsy predicts a risk of outside the breast recurrence over the next 9 years of nearly 40% if the patient's only systemic therapy is antiestrogens for 5 years.  It also predicts a significant benefit from chemotherapy.             (a) Oncotype reads the tumor also as triple negative.  (3) adjuvant chemotherapy consisting of cyclophosphamide and doxorubicin in dose dense fashion x4 started 07/17/2018, followed by weekly paclitaxel and carboplatin x12 starting on 09/18/2018, last dose 10/09/2018             (a) echo on 07/16/2018 shows EF  of 60-65%  (b)platinum/paclitaxel discontinued after 4 of 12 planned doses due to neuropathy  (4) adjuvant radiation to be completed 52/77/8242  PLAN: Cassandra Sanders has recovered nicely from chemotherapy.  She has an excellent functional status.  She is tolerating her radiation treatments well.  She will be done with radiation in mid December.  She would like to have her port removed before the end of the year for insurance reasons and I have alerted Dr. Donne Hazel regarding that  She will return to see [place] in February and then again in May.  Before the May visit she will have mammography.  From that point we will see her on every 6 month basis for the following year  I have encouraged her to continue her exercise program and to maintain appropriate pandemic precautions  She knows to call for any other issue that may develop before the next visit.  Cassandra Sanders. Cassandra Yurko MD Oncology and Hematology [place], am acting as scribe for Dr. Sarajane Jews C. Zaki Gertsch.  I, Lurline Del MD, have reviewed the above documentation for accuracy and completeness, and I agree with the above.    Tel. [phone]  FAX [phone]

## 2018-11-19 ENCOUNTER — Ambulatory Visit
Admission: RE | Admit: 2018-11-19 | Discharge: 2018-11-19 | Disposition: A | Discharge status: Home / Self Care | Payer: Federal, State, Local not specified - PPO | Source: Ambulatory Visit | Attending: Radiation Oncology | Admitting: Radiation Oncology

## 2018-11-19 ENCOUNTER — Inpatient Hospital Stay (HOSPITAL_BASED_OUTPATIENT_CLINIC_OR_DEPARTMENT_OTHER): Payer: Federal, State, Local not specified - PPO | Admitting: Oncology

## 2018-11-19 ENCOUNTER — Other Ambulatory Visit: Payer: Self-pay

## 2018-11-19 ENCOUNTER — Inpatient Hospital Stay: Payer: Federal, State, Local not specified - PPO | Attending: Oncology

## 2018-11-19 VITALS — BP 163/86 | HR 80 | Temp 98.3°F | Resp 18 | Ht 69.0 in | Wt 268.5 lb

## 2018-11-19 DIAGNOSIS — C50212 Malignant neoplasm of upper-inner quadrant of left female breast: Secondary | ICD-10-CM

## 2018-11-19 DIAGNOSIS — Z6841 Body Mass Index (BMI) 40.0 and over, adult: Secondary | ICD-10-CM

## 2018-11-19 DIAGNOSIS — Z171 Estrogen receptor negative status [ER-]: Secondary | ICD-10-CM | POA: Diagnosis not present

## 2018-11-19 DIAGNOSIS — Z79899 Other long term (current) drug therapy: Secondary | ICD-10-CM | POA: Diagnosis not present

## 2018-11-19 DIAGNOSIS — Z9221 Personal history of antineoplastic chemotherapy: Secondary | ICD-10-CM | POA: Insufficient documentation

## 2018-11-19 DIAGNOSIS — Z510 Encounter for antineoplastic radiation therapy: Secondary | ICD-10-CM | POA: Diagnosis not present

## 2018-11-19 LAB — CBC WITH DIFFERENTIAL/PLATELET
Abs Immature Granulocytes: 0.02 10*3/uL (ref 0.00–0.07)
Basophils Absolute: 0 10*3/uL (ref 0.0–0.1)
Basophils Relative: 0 %
Eosinophils Absolute: 0 10*3/uL (ref 0.0–0.5)
Eosinophils Relative: 1 %
HCT: 30.6 % — ABNORMAL LOW (ref 36.0–46.0)
Hemoglobin: 10.2 g/dL — ABNORMAL LOW (ref 12.0–15.0)
Immature Granulocytes: 1 %
Lymphocytes Relative: 19 %
Lymphs Abs: 0.5 10*3/uL — ABNORMAL LOW (ref 0.7–4.0)
MCH: 32.6 pg (ref 26.0–34.0)
MCHC: 33.3 g/dL (ref 30.0–36.0)
MCV: 97.8 fL (ref 80.0–100.0)
Monocytes Absolute: 0.4 10*3/uL (ref 0.1–1.0)
Monocytes Relative: 16 %
Neutro Abs: 1.7 10*3/uL (ref 1.7–7.7)
Neutrophils Relative %: 63 %
Platelets: 322 10*3/uL (ref 150–400)
RBC: 3.13 MIL/uL — ABNORMAL LOW (ref 3.87–5.11)
RDW: 14.5 % (ref 11.5–15.5)
WBC: 2.6 10*3/uL — ABNORMAL LOW (ref 4.0–10.5)
nRBC: 0 % (ref 0.0–0.2)

## 2018-11-19 LAB — COMPREHENSIVE METABOLIC PANEL
ALT: 18 U/L (ref 0–44)
AST: 15 U/L (ref 15–41)
Albumin: 4 g/dL (ref 3.5–5.0)
Alkaline Phosphatase: 91 U/L (ref 38–126)
Anion gap: 11 (ref 5–15)
BUN: 8 mg/dL (ref 6–20)
CO2: 23 mmol/L (ref 22–32)
Calcium: 8.9 mg/dL (ref 8.9–10.3)
Chloride: 106 mmol/L (ref 98–111)
Creatinine, Ser: 0.83 mg/dL (ref 0.44–1.00)
GFR calc Af Amer: >60 mL/min (ref >60)
GFR calc non Af Amer: >60 mL/min (ref >60)
Glucose, Bld: 136 mg/dL — ABNORMAL HIGH (ref 70–99)
Potassium: 3.6 mmol/L (ref 3.5–5.1)
Sodium: 140 mmol/L (ref 135–145)
Total Bilirubin: 0.3 mg/dL (ref 0.3–1.2)
Total Protein: 7.1 g/dL (ref 6.5–8.1)

## 2018-11-20 ENCOUNTER — Ambulatory Visit: Payer: Federal, State, Local not specified - PPO

## 2018-11-20 ENCOUNTER — Other Ambulatory Visit: Payer: Self-pay

## 2018-11-20 ENCOUNTER — Other Ambulatory Visit: Payer: Federal, State, Local not specified - PPO

## 2018-11-20 ENCOUNTER — Ambulatory Visit: Payer: Federal, State, Local not specified - PPO | Admitting: Adult Health

## 2018-11-20 ENCOUNTER — Ambulatory Visit
Admission: RE | Admit: 2018-11-20 | Discharge: 2018-11-20 | Disposition: A | Discharge status: Home / Self Care | Payer: Federal, State, Local not specified - PPO | Source: Ambulatory Visit | Attending: Radiation Oncology | Admitting: Radiation Oncology

## 2018-11-20 DIAGNOSIS — Z510 Encounter for antineoplastic radiation therapy: Secondary | ICD-10-CM | POA: Diagnosis not present

## 2018-11-20 DIAGNOSIS — Z171 Estrogen receptor negative status [ER-]: Secondary | ICD-10-CM | POA: Diagnosis not present

## 2018-11-20 DIAGNOSIS — C50212 Malignant neoplasm of upper-inner quadrant of left female breast: Secondary | ICD-10-CM | POA: Diagnosis not present

## 2018-11-23 ENCOUNTER — Other Ambulatory Visit: Payer: Self-pay

## 2018-11-23 ENCOUNTER — Ambulatory Visit
Admission: RE | Admit: 2018-11-23 | Discharge: 2018-11-23 | Disposition: A | Discharge status: Home / Self Care | Payer: Federal, State, Local not specified - PPO | Source: Ambulatory Visit | Attending: Radiation Oncology | Admitting: Radiation Oncology

## 2018-11-23 DIAGNOSIS — C50919 Malignant neoplasm of unspecified site of unspecified female breast: Secondary | ICD-10-CM | POA: Diagnosis not present

## 2018-11-23 DIAGNOSIS — Z23 Encounter for immunization: Secondary | ICD-10-CM | POA: Diagnosis not present

## 2018-11-23 DIAGNOSIS — I10 Essential (primary) hypertension: Secondary | ICD-10-CM | POA: Diagnosis not present

## 2018-11-23 DIAGNOSIS — Z171 Estrogen receptor negative status [ER-]: Secondary | ICD-10-CM | POA: Diagnosis not present

## 2018-11-23 DIAGNOSIS — E1169 Type 2 diabetes mellitus with other specified complication: Secondary | ICD-10-CM | POA: Diagnosis not present

## 2018-11-23 DIAGNOSIS — C50212 Malignant neoplasm of upper-inner quadrant of left female breast: Secondary | ICD-10-CM | POA: Diagnosis not present

## 2018-11-23 DIAGNOSIS — Z510 Encounter for antineoplastic radiation therapy: Secondary | ICD-10-CM | POA: Diagnosis not present

## 2018-11-24 ENCOUNTER — Other Ambulatory Visit: Payer: Self-pay

## 2018-11-24 ENCOUNTER — Ambulatory Visit
Admission: RE | Admit: 2018-11-24 | Discharge: 2018-11-24 | Disposition: A | Discharge status: Home / Self Care | Payer: Federal, State, Local not specified - PPO | Source: Ambulatory Visit | Attending: Radiation Oncology | Admitting: Radiation Oncology

## 2018-11-24 DIAGNOSIS — Z510 Encounter for antineoplastic radiation therapy: Secondary | ICD-10-CM | POA: Diagnosis not present

## 2018-11-24 DIAGNOSIS — C50212 Malignant neoplasm of upper-inner quadrant of left female breast: Secondary | ICD-10-CM | POA: Diagnosis not present

## 2018-11-24 DIAGNOSIS — Z171 Estrogen receptor negative status [ER-]: Secondary | ICD-10-CM | POA: Diagnosis not present

## 2018-11-25 ENCOUNTER — Other Ambulatory Visit: Payer: Self-pay

## 2018-11-25 ENCOUNTER — Ambulatory Visit
Admission: RE | Admit: 2018-11-25 | Discharge: 2018-11-25 | Disposition: A | Discharge status: Home / Self Care | Payer: Federal, State, Local not specified - PPO | Source: Ambulatory Visit | Attending: Radiation Oncology | Admitting: Radiation Oncology

## 2018-11-25 DIAGNOSIS — Z510 Encounter for antineoplastic radiation therapy: Secondary | ICD-10-CM | POA: Diagnosis not present

## 2018-11-25 DIAGNOSIS — Z171 Estrogen receptor negative status [ER-]: Secondary | ICD-10-CM | POA: Diagnosis not present

## 2018-11-25 DIAGNOSIS — C50212 Malignant neoplasm of upper-inner quadrant of left female breast: Secondary | ICD-10-CM | POA: Diagnosis not present

## 2018-11-26 ENCOUNTER — Ambulatory Visit
Admission: RE | Admit: 2018-11-26 | Discharge: 2018-11-26 | Disposition: A | Discharge status: Home / Self Care | Payer: Federal, State, Local not specified - PPO | Source: Ambulatory Visit | Attending: Radiation Oncology | Admitting: Radiation Oncology

## 2018-11-26 ENCOUNTER — Other Ambulatory Visit: Payer: Self-pay

## 2018-11-26 DIAGNOSIS — Z510 Encounter for antineoplastic radiation therapy: Secondary | ICD-10-CM | POA: Diagnosis not present

## 2018-11-26 DIAGNOSIS — C50212 Malignant neoplasm of upper-inner quadrant of left female breast: Secondary | ICD-10-CM | POA: Diagnosis not present

## 2018-11-26 DIAGNOSIS — Z171 Estrogen receptor negative status [ER-]: Secondary | ICD-10-CM | POA: Diagnosis not present

## 2018-11-27 ENCOUNTER — Other Ambulatory Visit: Payer: Federal, State, Local not specified - PPO

## 2018-11-27 ENCOUNTER — Other Ambulatory Visit: Payer: Self-pay

## 2018-11-27 ENCOUNTER — Ambulatory Visit: Payer: Federal, State, Local not specified - PPO

## 2018-11-27 ENCOUNTER — Ambulatory Visit
Admission: RE | Admit: 2018-11-27 | Discharge: 2018-11-27 | Disposition: A | Discharge status: Home / Self Care | Payer: Federal, State, Local not specified - PPO | Source: Ambulatory Visit | Attending: Radiation Oncology | Admitting: Radiation Oncology

## 2018-11-27 ENCOUNTER — Ambulatory Visit: Payer: Federal, State, Local not specified - PPO | Admitting: Adult Health

## 2018-11-27 DIAGNOSIS — C50212 Malignant neoplasm of upper-inner quadrant of left female breast: Secondary | ICD-10-CM | POA: Diagnosis not present

## 2018-11-27 DIAGNOSIS — Z171 Estrogen receptor negative status [ER-]: Secondary | ICD-10-CM | POA: Diagnosis not present

## 2018-11-27 DIAGNOSIS — Z510 Encounter for antineoplastic radiation therapy: Secondary | ICD-10-CM | POA: Diagnosis not present

## 2018-11-29 ENCOUNTER — Ambulatory Visit
Admission: RE | Admit: 2018-11-29 | Discharge: 2018-11-29 | Disposition: A | Discharge status: Home / Self Care | Payer: Federal, State, Local not specified - PPO | Source: Ambulatory Visit | Attending: Radiation Oncology | Admitting: Radiation Oncology

## 2018-11-29 ENCOUNTER — Other Ambulatory Visit: Payer: Self-pay

## 2018-11-29 DIAGNOSIS — Z171 Estrogen receptor negative status [ER-]: Secondary | ICD-10-CM | POA: Diagnosis not present

## 2018-11-29 DIAGNOSIS — C50212 Malignant neoplasm of upper-inner quadrant of left female breast: Secondary | ICD-10-CM

## 2018-11-29 DIAGNOSIS — Z510 Encounter for antineoplastic radiation therapy: Secondary | ICD-10-CM | POA: Diagnosis not present

## 2018-11-29 MED ORDER — SONAFINE EX EMUL
1.0000 "application " | Freq: Once | CUTANEOUS | Status: AC
  Administered 2018-11-29: 1 via TOPICAL

## 2018-11-29 MED ORDER — ALRA NON-METALLIC DEODORANT (RAD-ONC)
1.0000 "application " | Freq: Once | TOPICAL | Status: AC
  Administered 2018-11-29: 1 via TOPICAL

## 2018-11-30 ENCOUNTER — Ambulatory Visit
Admission: RE | Admit: 2018-11-30 | Discharge: 2018-11-30 | Disposition: A | Discharge status: Home / Self Care | Payer: Federal, State, Local not specified - PPO | Source: Ambulatory Visit | Attending: Radiation Oncology | Admitting: Radiation Oncology

## 2018-11-30 ENCOUNTER — Other Ambulatory Visit: Payer: Self-pay

## 2018-11-30 DIAGNOSIS — C50212 Malignant neoplasm of upper-inner quadrant of left female breast: Secondary | ICD-10-CM | POA: Diagnosis not present

## 2018-11-30 DIAGNOSIS — Z510 Encounter for antineoplastic radiation therapy: Secondary | ICD-10-CM | POA: Diagnosis not present

## 2018-11-30 DIAGNOSIS — Z171 Estrogen receptor negative status [ER-]: Secondary | ICD-10-CM | POA: Diagnosis not present

## 2018-12-01 ENCOUNTER — Ambulatory Visit
Admission: RE | Admit: 2018-12-01 | Discharge: 2018-12-01 | Disposition: A | Discharge status: Home / Self Care | Payer: Federal, State, Local not specified - PPO | Source: Ambulatory Visit | Attending: Radiation Oncology | Admitting: Radiation Oncology

## 2018-12-01 ENCOUNTER — Other Ambulatory Visit: Payer: Self-pay

## 2018-12-01 DIAGNOSIS — Z171 Estrogen receptor negative status [ER-]: Secondary | ICD-10-CM | POA: Diagnosis not present

## 2018-12-01 DIAGNOSIS — C50212 Malignant neoplasm of upper-inner quadrant of left female breast: Secondary | ICD-10-CM | POA: Diagnosis not present

## 2018-12-01 DIAGNOSIS — Z510 Encounter for antineoplastic radiation therapy: Secondary | ICD-10-CM | POA: Diagnosis not present

## 2018-12-02 ENCOUNTER — Other Ambulatory Visit: Payer: Self-pay

## 2018-12-02 ENCOUNTER — Ambulatory Visit
Admission: RE | Admit: 2018-12-02 | Discharge: 2018-12-02 | Disposition: A | Discharge status: Home / Self Care | Payer: Federal, State, Local not specified - PPO | Source: Ambulatory Visit | Attending: Radiation Oncology | Admitting: Radiation Oncology

## 2018-12-02 DIAGNOSIS — C50212 Malignant neoplasm of upper-inner quadrant of left female breast: Secondary | ICD-10-CM | POA: Diagnosis not present

## 2018-12-02 DIAGNOSIS — Z510 Encounter for antineoplastic radiation therapy: Secondary | ICD-10-CM | POA: Diagnosis not present

## 2018-12-02 DIAGNOSIS — Z171 Estrogen receptor negative status [ER-]: Secondary | ICD-10-CM | POA: Diagnosis not present

## 2018-12-04 ENCOUNTER — Ambulatory Visit: Payer: Federal, State, Local not specified - PPO

## 2018-12-04 ENCOUNTER — Ambulatory Visit: Payer: Federal, State, Local not specified - PPO | Admitting: Adult Health

## 2018-12-04 ENCOUNTER — Other Ambulatory Visit: Payer: Federal, State, Local not specified - PPO

## 2018-12-07 ENCOUNTER — Ambulatory Visit
Admission: RE | Admit: 2018-12-07 | Discharge: 2018-12-07 | Disposition: A | Discharge status: Home / Self Care | Payer: Federal, State, Local not specified - PPO | Source: Ambulatory Visit | Attending: Radiation Oncology | Admitting: Radiation Oncology

## 2018-12-07 ENCOUNTER — Other Ambulatory Visit: Payer: Self-pay

## 2018-12-07 DIAGNOSIS — Z171 Estrogen receptor negative status [ER-]: Secondary | ICD-10-CM | POA: Diagnosis not present

## 2018-12-07 DIAGNOSIS — C50212 Malignant neoplasm of upper-inner quadrant of left female breast: Secondary | ICD-10-CM | POA: Diagnosis not present

## 2018-12-07 DIAGNOSIS — Z510 Encounter for antineoplastic radiation therapy: Secondary | ICD-10-CM | POA: Diagnosis not present

## 2018-12-08 ENCOUNTER — Ambulatory Visit
Admission: RE | Admit: 2018-12-08 | Discharge: 2018-12-08 | Disposition: A | Discharge status: Home / Self Care | Payer: Federal, State, Local not specified - PPO | Source: Ambulatory Visit | Attending: Radiation Oncology | Admitting: Radiation Oncology

## 2018-12-08 ENCOUNTER — Other Ambulatory Visit: Payer: Self-pay

## 2018-12-08 DIAGNOSIS — Z510 Encounter for antineoplastic radiation therapy: Secondary | ICD-10-CM | POA: Insufficient documentation

## 2018-12-08 DIAGNOSIS — C50212 Malignant neoplasm of upper-inner quadrant of left female breast: Secondary | ICD-10-CM | POA: Insufficient documentation

## 2018-12-08 DIAGNOSIS — Z171 Estrogen receptor negative status [ER-]: Secondary | ICD-10-CM | POA: Insufficient documentation

## 2018-12-09 ENCOUNTER — Other Ambulatory Visit: Payer: Self-pay

## 2018-12-09 ENCOUNTER — Ambulatory Visit
Admission: RE | Admit: 2018-12-09 | Discharge: 2018-12-09 | Disposition: A | Discharge status: Home / Self Care | Payer: Federal, State, Local not specified - PPO | Source: Ambulatory Visit | Attending: Radiation Oncology | Admitting: Radiation Oncology

## 2018-12-09 DIAGNOSIS — C50212 Malignant neoplasm of upper-inner quadrant of left female breast: Secondary | ICD-10-CM | POA: Diagnosis not present

## 2018-12-09 DIAGNOSIS — Z171 Estrogen receptor negative status [ER-]: Secondary | ICD-10-CM | POA: Diagnosis not present

## 2018-12-09 DIAGNOSIS — Z510 Encounter for antineoplastic radiation therapy: Secondary | ICD-10-CM | POA: Diagnosis not present

## 2018-12-10 ENCOUNTER — Ambulatory Visit
Admission: RE | Admit: 2018-12-10 | Discharge: 2018-12-10 | Disposition: A | Discharge status: Home / Self Care | Payer: Federal, State, Local not specified - PPO | Source: Ambulatory Visit | Attending: Radiation Oncology | Admitting: Radiation Oncology

## 2018-12-10 ENCOUNTER — Other Ambulatory Visit: Payer: Self-pay

## 2018-12-10 DIAGNOSIS — Z171 Estrogen receptor negative status [ER-]: Secondary | ICD-10-CM | POA: Diagnosis not present

## 2018-12-10 DIAGNOSIS — C50212 Malignant neoplasm of upper-inner quadrant of left female breast: Secondary | ICD-10-CM | POA: Diagnosis not present

## 2018-12-10 DIAGNOSIS — Z510 Encounter for antineoplastic radiation therapy: Secondary | ICD-10-CM | POA: Diagnosis not present

## 2018-12-11 ENCOUNTER — Other Ambulatory Visit: Payer: Self-pay

## 2018-12-11 ENCOUNTER — Ambulatory Visit
Admission: RE | Admit: 2018-12-11 | Discharge: 2018-12-11 | Disposition: A | Discharge status: Home / Self Care | Payer: Federal, State, Local not specified - PPO | Source: Ambulatory Visit | Attending: Radiation Oncology | Admitting: Radiation Oncology

## 2018-12-11 DIAGNOSIS — Z171 Estrogen receptor negative status [ER-]: Secondary | ICD-10-CM | POA: Diagnosis not present

## 2018-12-11 DIAGNOSIS — C50212 Malignant neoplasm of upper-inner quadrant of left female breast: Secondary | ICD-10-CM | POA: Diagnosis not present

## 2018-12-11 DIAGNOSIS — Z510 Encounter for antineoplastic radiation therapy: Secondary | ICD-10-CM | POA: Diagnosis not present

## 2018-12-14 ENCOUNTER — Ambulatory Visit
Admission: RE | Admit: 2018-12-14 | Discharge: 2018-12-14 | Disposition: A | Discharge status: Home / Self Care | Payer: Federal, State, Local not specified - PPO | Source: Ambulatory Visit | Attending: Radiation Oncology | Admitting: Radiation Oncology

## 2018-12-14 ENCOUNTER — Other Ambulatory Visit: Payer: Self-pay

## 2018-12-14 DIAGNOSIS — Z510 Encounter for antineoplastic radiation therapy: Secondary | ICD-10-CM | POA: Diagnosis not present

## 2018-12-14 DIAGNOSIS — C50212 Malignant neoplasm of upper-inner quadrant of left female breast: Secondary | ICD-10-CM | POA: Diagnosis not present

## 2018-12-14 DIAGNOSIS — Z171 Estrogen receptor negative status [ER-]: Secondary | ICD-10-CM | POA: Diagnosis not present

## 2018-12-15 ENCOUNTER — Ambulatory Visit
Admission: RE | Admit: 2018-12-15 | Discharge: 2018-12-15 | Disposition: A | Discharge status: Home / Self Care | Payer: Federal, State, Local not specified - PPO | Source: Ambulatory Visit | Attending: Radiation Oncology | Admitting: Radiation Oncology

## 2018-12-15 ENCOUNTER — Other Ambulatory Visit: Payer: Self-pay

## 2018-12-15 DIAGNOSIS — C50212 Malignant neoplasm of upper-inner quadrant of left female breast: Secondary | ICD-10-CM | POA: Diagnosis not present

## 2018-12-15 DIAGNOSIS — Z171 Estrogen receptor negative status [ER-]: Secondary | ICD-10-CM | POA: Diagnosis not present

## 2018-12-15 DIAGNOSIS — Z510 Encounter for antineoplastic radiation therapy: Secondary | ICD-10-CM | POA: Diagnosis not present

## 2018-12-16 ENCOUNTER — Other Ambulatory Visit: Payer: Self-pay

## 2018-12-16 ENCOUNTER — Ambulatory Visit
Admission: RE | Admit: 2018-12-16 | Discharge: 2018-12-16 | Disposition: A | Discharge status: Home / Self Care | Payer: Federal, State, Local not specified - PPO | Source: Ambulatory Visit | Attending: Radiation Oncology | Admitting: Radiation Oncology

## 2018-12-16 DIAGNOSIS — Z510 Encounter for antineoplastic radiation therapy: Secondary | ICD-10-CM | POA: Diagnosis not present

## 2018-12-16 DIAGNOSIS — C50212 Malignant neoplasm of upper-inner quadrant of left female breast: Secondary | ICD-10-CM | POA: Diagnosis not present

## 2018-12-16 DIAGNOSIS — Z171 Estrogen receptor negative status [ER-]: Secondary | ICD-10-CM | POA: Diagnosis not present

## 2018-12-17 ENCOUNTER — Ambulatory Visit
Admission: RE | Admit: 2018-12-17 | Discharge: 2018-12-17 | Disposition: A | Discharge status: Home / Self Care | Payer: Federal, State, Local not specified - PPO | Source: Ambulatory Visit | Attending: Radiation Oncology | Admitting: Radiation Oncology

## 2018-12-17 ENCOUNTER — Encounter: Payer: Self-pay | Admitting: Radiation Oncology

## 2018-12-17 ENCOUNTER — Other Ambulatory Visit: Payer: Self-pay

## 2018-12-17 DIAGNOSIS — Z510 Encounter for antineoplastic radiation therapy: Secondary | ICD-10-CM | POA: Diagnosis not present

## 2018-12-17 DIAGNOSIS — Z171 Estrogen receptor negative status [ER-]: Secondary | ICD-10-CM | POA: Diagnosis not present

## 2018-12-17 DIAGNOSIS — C50212 Malignant neoplasm of upper-inner quadrant of left female breast: Secondary | ICD-10-CM | POA: Diagnosis not present

## 2018-12-18 ENCOUNTER — Ambulatory Visit: Payer: Federal, State, Local not specified - PPO

## 2018-12-18 ENCOUNTER — Other Ambulatory Visit: Payer: Self-pay

## 2018-12-18 ENCOUNTER — Ambulatory Visit: Payer: Federal, State, Local not specified - PPO | Admitting: Radiation Oncology

## 2018-12-18 DIAGNOSIS — C50212 Malignant neoplasm of upper-inner quadrant of left female breast: Secondary | ICD-10-CM | POA: Diagnosis not present

## 2018-12-18 DIAGNOSIS — Z510 Encounter for antineoplastic radiation therapy: Secondary | ICD-10-CM | POA: Diagnosis not present

## 2018-12-18 DIAGNOSIS — Z171 Estrogen receptor negative status [ER-]: Secondary | ICD-10-CM | POA: Diagnosis not present

## 2018-12-21 ENCOUNTER — Ambulatory Visit: Payer: Federal, State, Local not specified - PPO

## 2018-12-21 ENCOUNTER — Other Ambulatory Visit: Payer: Self-pay

## 2018-12-21 DIAGNOSIS — Z510 Encounter for antineoplastic radiation therapy: Secondary | ICD-10-CM | POA: Diagnosis not present

## 2018-12-21 DIAGNOSIS — C50212 Malignant neoplasm of upper-inner quadrant of left female breast: Secondary | ICD-10-CM | POA: Diagnosis not present

## 2018-12-21 DIAGNOSIS — Z171 Estrogen receptor negative status [ER-]: Secondary | ICD-10-CM | POA: Diagnosis not present

## 2018-12-22 ENCOUNTER — Ambulatory Visit
Admission: RE | Admit: 2018-12-22 | Discharge: 2018-12-22 | Disposition: A | Discharge status: Home / Self Care | Payer: Federal, State, Local not specified - PPO | Source: Ambulatory Visit | Attending: Radiation Oncology | Admitting: Radiation Oncology

## 2018-12-22 ENCOUNTER — Other Ambulatory Visit: Payer: Self-pay | Admitting: Oncology

## 2018-12-22 ENCOUNTER — Other Ambulatory Visit: Payer: Self-pay

## 2018-12-22 DIAGNOSIS — C50212 Malignant neoplasm of upper-inner quadrant of left female breast: Secondary | ICD-10-CM | POA: Diagnosis not present

## 2018-12-22 DIAGNOSIS — Z171 Estrogen receptor negative status [ER-]: Secondary | ICD-10-CM | POA: Diagnosis not present

## 2018-12-22 DIAGNOSIS — Z510 Encounter for antineoplastic radiation therapy: Secondary | ICD-10-CM | POA: Diagnosis not present

## 2018-12-23 ENCOUNTER — Ambulatory Visit
Admission: RE | Admit: 2018-12-23 | Discharge: 2018-12-23 | Disposition: A | Discharge status: Home / Self Care | Payer: Federal, State, Local not specified - PPO | Source: Ambulatory Visit | Attending: Radiation Oncology | Admitting: Radiation Oncology

## 2018-12-23 ENCOUNTER — Other Ambulatory Visit: Payer: Self-pay

## 2018-12-23 DIAGNOSIS — Z171 Estrogen receptor negative status [ER-]: Secondary | ICD-10-CM | POA: Diagnosis not present

## 2018-12-23 DIAGNOSIS — Z510 Encounter for antineoplastic radiation therapy: Secondary | ICD-10-CM | POA: Diagnosis not present

## 2018-12-23 DIAGNOSIS — C50212 Malignant neoplasm of upper-inner quadrant of left female breast: Secondary | ICD-10-CM | POA: Diagnosis not present

## 2018-12-24 ENCOUNTER — Ambulatory Visit: Payer: Federal, State, Local not specified - PPO

## 2018-12-24 ENCOUNTER — Ambulatory Visit
Admission: RE | Admit: 2018-12-24 | Discharge: 2018-12-24 | Disposition: A | Discharge status: Home / Self Care | Payer: Federal, State, Local not specified - PPO | Source: Ambulatory Visit | Attending: Radiation Oncology | Admitting: Radiation Oncology

## 2018-12-24 ENCOUNTER — Encounter: Payer: Self-pay | Admitting: *Deleted

## 2018-12-24 ENCOUNTER — Other Ambulatory Visit: Payer: Self-pay

## 2018-12-24 DIAGNOSIS — C50212 Malignant neoplasm of upper-inner quadrant of left female breast: Secondary | ICD-10-CM | POA: Diagnosis not present

## 2018-12-24 DIAGNOSIS — Z510 Encounter for antineoplastic radiation therapy: Secondary | ICD-10-CM | POA: Diagnosis not present

## 2018-12-24 DIAGNOSIS — Z171 Estrogen receptor negative status [ER-]: Secondary | ICD-10-CM | POA: Diagnosis not present

## 2018-12-25 ENCOUNTER — Ambulatory Visit: Payer: Federal, State, Local not specified - PPO

## 2019-01-05 ENCOUNTER — Encounter: Payer: Self-pay | Admitting: *Deleted

## 2019-01-07 DIAGNOSIS — Z01818 Encounter for other preprocedural examination: Secondary | ICD-10-CM | POA: Diagnosis not present

## 2019-01-11 DIAGNOSIS — Z853 Personal history of malignant neoplasm of breast: Secondary | ICD-10-CM | POA: Diagnosis not present

## 2019-01-11 DIAGNOSIS — Z452 Encounter for adjustment and management of vascular access device: Secondary | ICD-10-CM | POA: Diagnosis not present

## 2019-01-20 ENCOUNTER — Telehealth: Payer: Self-pay | Admitting: Radiation Oncology

## 2019-01-20 NOTE — Telephone Encounter (Signed)
  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  Interval Since Last Radiation:  4 weeks   11/09/2018-12/24/18: The left breast was treated to 50.4 Gy in 28 fractions followed by a 10 Gy in 5 fractions.   Narrative:  The patient was contacted today for routine follow-up. During treatment she did very well with radiotherapy and did not have significant desquamation. She reports she is doing well and her skin is almost back to normal color.  Impression/Plan: 1.  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 The patient has been doing well since completion of radiotherapy. We discussed that we would be happy to continue to follow her as needed, but she will also continue to follow up with Dr. Jana Hakim in medical oncology. She was counseled on skin care as well as measures to avoid sun exposure to this area.  2. Survivorship. We discussed the importance of survivorship evaluation and was given the phone number for Ottis Stain [phone]) to be added to the list to receive the monthly resource calendar for the cancer center.     Carola Rhine, PAC

## 2019-02-22 ENCOUNTER — Inpatient Hospital Stay: Payer: Federal, State, Local not specified - PPO | Attending: Adult Health

## 2019-02-22 ENCOUNTER — Other Ambulatory Visit: Payer: Self-pay

## 2019-02-22 ENCOUNTER — Encounter: Payer: Self-pay | Admitting: Adult Health

## 2019-02-22 ENCOUNTER — Inpatient Hospital Stay (HOSPITAL_BASED_OUTPATIENT_CLINIC_OR_DEPARTMENT_OTHER): Payer: Federal, State, Local not specified - PPO | Admitting: Adult Health

## 2019-02-22 ENCOUNTER — Telehealth: Payer: Self-pay | Admitting: Adult Health

## 2019-02-22 VITALS — BP 147/92 | HR 86 | Temp 97.8°F | Resp 18 | Ht 69.0 in | Wt 267.1 lb

## 2019-02-22 DIAGNOSIS — Z9221 Personal history of antineoplastic chemotherapy: Secondary | ICD-10-CM | POA: Insufficient documentation

## 2019-02-22 DIAGNOSIS — Z171 Estrogen receptor negative status [ER-]: Secondary | ICD-10-CM | POA: Insufficient documentation

## 2019-02-22 DIAGNOSIS — I10 Essential (primary) hypertension: Secondary | ICD-10-CM | POA: Diagnosis not present

## 2019-02-22 DIAGNOSIS — Z923 Personal history of irradiation: Secondary | ICD-10-CM | POA: Diagnosis not present

## 2019-02-22 DIAGNOSIS — Z79899 Other long term (current) drug therapy: Secondary | ICD-10-CM | POA: Insufficient documentation

## 2019-02-22 DIAGNOSIS — Z7982 Long term (current) use of aspirin: Secondary | ICD-10-CM | POA: Insufficient documentation

## 2019-02-22 DIAGNOSIS — E119 Type 2 diabetes mellitus without complications: Secondary | ICD-10-CM | POA: Diagnosis not present

## 2019-02-22 DIAGNOSIS — Z170 Estrogen receptor positive status [ER+]: Secondary | ICD-10-CM

## 2019-02-22 DIAGNOSIS — Z7984 Long term (current) use of oral hypoglycemic drugs: Secondary | ICD-10-CM | POA: Insufficient documentation

## 2019-02-22 DIAGNOSIS — C50212 Malignant neoplasm of upper-inner quadrant of left female breast: Secondary | ICD-10-CM

## 2019-02-22 LAB — CBC WITH DIFFERENTIAL/PLATELET
Abs Immature Granulocytes: 0.02 10*3/uL (ref 0.00–0.07)
Basophils Absolute: 0 10*3/uL (ref 0.0–0.1)
Basophils Relative: 1 %
Eosinophils Absolute: 0.1 10*3/uL (ref 0.0–0.5)
Eosinophils Relative: 4 %
HCT: 34.5 % — ABNORMAL LOW (ref 36.0–46.0)
Hemoglobin: 11.5 g/dL — ABNORMAL LOW (ref 12.0–15.0)
Immature Granulocytes: 1 %
Lymphocytes Relative: 23 %
Lymphs Abs: 0.9 10*3/uL (ref 0.7–4.0)
MCH: 30.4 pg (ref 26.0–34.0)
MCHC: 33.3 g/dL (ref 30.0–36.0)
MCV: 91.3 fL (ref 80.0–100.0)
Monocytes Absolute: 0.4 10*3/uL (ref 0.1–1.0)
Monocytes Relative: 10 %
Neutro Abs: 2.5 10*3/uL (ref 1.7–7.7)
Neutrophils Relative %: 61 %
Platelets: 286 10*3/uL (ref 150–400)
RBC: 3.78 MIL/uL — ABNORMAL LOW (ref 3.87–5.11)
RDW: 13.1 % (ref 11.5–15.5)
WBC: 4 10*3/uL (ref 4.0–10.5)
nRBC: 0 % (ref 0.0–0.2)

## 2019-02-22 LAB — COMPREHENSIVE METABOLIC PANEL
ALT: 22 U/L (ref 0–44)
AST: 16 U/L (ref 15–41)
Albumin: 3.9 g/dL (ref 3.5–5.0)
Alkaline Phosphatase: 95 U/L (ref 38–126)
Anion gap: 9 (ref 5–15)
BUN: 9 mg/dL (ref 6–20)
CO2: 27 mmol/L (ref 22–32)
Calcium: 9.3 mg/dL (ref 8.9–10.3)
Chloride: 105 mmol/L (ref 98–111)
Creatinine, Ser: 0.96 mg/dL (ref 0.44–1.00)
GFR calc Af Amer: >60 mL/min (ref >60)
GFR calc non Af Amer: >60 mL/min (ref >60)
Glucose, Bld: 153 mg/dL — ABNORMAL HIGH (ref 70–99)
Potassium: 4.2 mmol/L (ref 3.5–5.1)
Sodium: 141 mmol/L (ref 135–145)
Total Bilirubin: 0.3 mg/dL (ref 0.3–1.2)
Total Protein: 7.6 g/dL (ref 6.5–8.1)

## 2019-02-22 NOTE — Telephone Encounter (Signed)
I left a message regarding schedule  
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
## 2019-02-23 NOTE — Progress Notes (Signed)
  Radiation Oncology         [phone]) [phone] ________________________________  Name: Cassandra Sanders MRN: [id number]  Date: 12/17/2018  DOB: May 09, 1965  End of Treatment Note  Diagnosis:   left-sided breast cancer     Indication for treatment:  Curative       Radiation treatment dates:   11/09/18 - 12/17/18  Site/dose:   The patient initially received a dose of 50.4 Gy in 28 fractions to the breast using whole-breast tangent fields. This was delivered using a 3-D conformal technique. The patient then received a boost to the seroma. This delivered an additional 10 Gy in 5 fractions using a n en face electron field. The total dose was 60.4 Gy.  Narrative: The patient tolerated radiation treatment relatively well.   The patient had some expected skin irritation as she progressed during treatment. Moist desquamation was not present at the end of treatment.  Plan: The patient has completed radiation treatment. The patient will return to radiation oncology clinic for routine followup in one month. I advised the patient to call or return sooner if they have any questions or concerns related to their recovery or treatment. ________________________________  Jodelle Gross, M.D., Ph.D.

## 2019-03-02 DIAGNOSIS — Z0000 Encounter for general adult medical examination without abnormal findings: Secondary | ICD-10-CM | POA: Diagnosis not present

## 2019-03-02 DIAGNOSIS — E1169 Type 2 diabetes mellitus with other specified complication: Secondary | ICD-10-CM | POA: Diagnosis not present

## 2019-03-02 DIAGNOSIS — I10 Essential (primary) hypertension: Secondary | ICD-10-CM | POA: Diagnosis not present

## 2019-04-10 ENCOUNTER — Other Ambulatory Visit: Payer: Self-pay

## 2019-04-10 ENCOUNTER — Ambulatory Visit (HOSPITAL_COMMUNITY)
Admission: EM | Admit: 2019-04-10 | Discharge: 2019-04-10 | Disposition: A | Discharge status: Home / Self Care | Payer: Federal, State, Local not specified - PPO

## 2019-04-10 ENCOUNTER — Encounter (HOSPITAL_COMMUNITY): Payer: Self-pay

## 2019-04-10 DIAGNOSIS — N9089 Other specified noninflammatory disorders of vulva and perineum: Secondary | ICD-10-CM | POA: Diagnosis not present

## 2019-04-10 NOTE — Discharge Instructions (Addendum)
Your exam today is reassuring.  You can try to use a small amount of vaseline or aquaphor to moisturize the vulva.  I would like you to follow up with your gynecologist if your symptoms persist as you may need further evaluation or management options.

## 2019-04-10 NOTE — ED Provider Notes (Signed)
[name]    CSN: [id number] Arrival date & time: 04/10/19  1013      History   Chief Complaint Chief Complaint  Patient presents with  . Vaginitis    HPI Cassandra Sanders is a 54 y.o. female.   [name] presents with complaints of a "tingling" sensation near her clitoris which has been ongoing for at least a few days now. Denies any vaginal itching, burning, pain, or discharge. States it is not necessarily painful or uncomfortable but a noticeable sensation. She is seeking examination, as she states her fiance was seen and treated for a bacterial infection to the genitals, she describes as a pimple like lesion. She denies concerns for stds and declines std testing today. States she sees her gynecologist next month. History of chemo s/p breast cancer, no active treatment. She is post menopausal. No urinary symptoms. No vaginal bleeding. No pelvic pain.    ROS per HPI, negative if not otherwise mentioned.      Past Medical History:  Diagnosis Date  . Breast cancer ([place]) 06/04/2018   left breast  . Diabetes mellitus without complication ([place])   . Hypertension     Patient Active Problem List   Diagnosis Date Noted  . Peripheral neuropathy 10/16/2018  . Encounter for antineoplastic chemotherapy 09/25/2018  . Port-A-Cath in place 07/17/2018  . Morbid obesity with BMI of 40.0-44.9, adult ([place]) 06/10/2018  . Malignant neoplasm of upper-inner quadrant of left breast in female, estrogen receptor negative ([place]) 06/08/2018  . Right shoulder pain 06/21/2016  . Strain of calf muscle, initial encounter 05/30/2016    Past Surgical History:  Procedure Laterality Date  . BREAST LUMPECTOMY WITH RADIOACTIVE SEED AND SENTINEL LYMPH NODE BIOPSY Left 06/26/2018   Procedure: LEFT BREAST LUMPECTOMY WITH RADIOACTIVE SEED AND LEFT AXILLARY SENTINEL LYMPH NODE BIOPSY;  Surgeon: Rolm Bookbinder, MD;  Location: [facility];  Service: General;  Laterality:  Left;  . PORTACATH PLACEMENT Right 06/26/2018   Procedure: INSERTION PORT-A-CATH WITH ULTRASOUND;  Surgeon: Rolm Bookbinder, MD;  Location: [facility];  Service: General;  Laterality: Right;    OB History   No obstetric history on file.      Home Medications    Prior to Admission medications   Medication Sig Start Date End Date Taking? Authorizing Provider  aspirin EC 81 MG tablet Take 81 mg by mouth daily.   Yes [provider]  atorvastatin (LIPITOR) 10 MG tablet Take 10 mg by mouth daily. 03/02/19  Yes [provider]  JARDIANCE 25 MG TABS tablet Take 25 mg by mouth daily. 04/06/19  Yes [provider]  lisinopril (PRINIVIL,ZESTRIL) 10 MG tablet Take 10 mg by mouth daily. 04/08/16  Yes [provider]  METFORMIN HCL PO Take 1,000 mg by mouth daily.    Yes [provider]  JANUVIA 100 MG tablet daily.  05/20/16   [provider]  omeprazole (PRILOSEC) 40 MG capsule TAKE 1 CAPSULE BY MOUTH EVERY DAY 10/08/18   Causey, Charlestine Massed, NP  prochlorperazine (COMPAZINE) 10 MG tablet Take 1 tablet (10 mg total) by mouth every 6 (six) hours as needed (Nausea or vomiting). 07/14/18 10/19/18  Magrinat, Virgie Dad, MD    Family History History reviewed. No pertinent family history.  Social History Social History   Tobacco Use  . Smoking status: Never Smoker  . Smokeless tobacco: Never Used  Substance Use Topics  . Alcohol use: No  . Drug use: No  Allergies   Patient has no known allergies.   Review of Systems Review of Systems   Physical Exam Triage Vital Signs ED Triage Vitals  Enc Vitals Group     BP 04/10/19 1047 (!) 143/90     Pulse Rate 04/10/19 1047 99     Resp 04/10/19 1047 18     Temp 04/10/19 1047 98 F (36.7 C)     Temp Source 04/10/19 1047 Oral     SpO2 04/10/19 1047 98 %     Weight --      Height --      Head Circumference --      Peak Flow --      Pain Score 04/10/19 1045 0     Pain  Loc --      Pain Edu? --      Excl. in [place]? --    No data found.  Updated Vital Signs BP (!) 143/90 (BP Location: Right Arm)   Pulse 99   Temp 98 F (36.7 C) (Oral)   Resp 18   LMP 06/18/2013   SpO2 98%   Visual Acuity Right Eye Distance:   Left Eye Distance:   Bilateral Distance:    Right Eye Near:   Left Eye Near:    Bilateral Near:     Physical Exam Constitutional:      General: She is not in acute distress.    Appearance: She is well-developed.  Cardiovascular:     Rate and Rhythm: Normal rate.  Pulmonary:     Effort: Pulmonary effort is normal.  Genitourinary:    Pubic Area: No rash.      Comments: External vulva does appear to have a dry appearance with minimal rugae and with some patchy dry skin to posterior vulva- patient states this is not new or changed from her baseline. 52mm fissure noted at approximately 1o clock to clitoris; scant white creamy discharge noted to external vagina Skin:    General: Skin is warm and dry.  Neurological:     Mental Status: She is alert and oriented to person, place, and time.      UC Treatments / Results  Labs (all labs ordered are listed, but only abnormal results are displayed) Labs Reviewed - No data to display  EKG   Radiology No results found.  Procedures Procedures (including critical care time)  Medications Ordered in UC Medications - No data to display  Initial Impression / Assessment and Plan / UC Course  I have reviewed the triage vital signs and the nursing notes.  Pertinent labs & imaging results that were available during my care of the patient were reviewed by me and considered in my medical decision making (see chart for details).     Likely post-menopausal changes to vulva noted, lichen also to be considered ? Patient endorses some what chronic rash/ scaling skin to posterior vulva. Very small fissure noted, no other papules, pustules or ulcerations. Patient declines std screening today. Has  gynecology follow up next month. Return precautions provided. Patient verbalized understanding and agreeable to plan.   Final Clinical Impressions(s) / UC Diagnoses   Final diagnoses:  Vulvar irritation     Discharge Instructions     Your exam today is reassuring.  You can try to use a small amount of vaseline or aquaphor to moisturize the vulva.  I would like you to follow up with your gynecologist if your symptoms persist as you may need further evaluation or management options.  ED Prescriptions    None     PDMP not reviewed this encounter.   Zigmund Gottron, NP 04/10/19 2038

## 2019-04-10 NOTE — ED Triage Notes (Signed)
Pt c/o itching to clitoris area for approx 1 week. Denies vaginal discharge, vaginal bleeding, or dysuria sx. States her fiancee was evaluated here this past week for a "blister" on his penis and was Rx a topical medication for a "bacterial infection". Pt denies abdom pain, fever, chills.

## 2019-05-07 DIAGNOSIS — E113211 Type 2 diabetes mellitus with mild nonproliferative diabetic retinopathy with macular edema, right eye: Secondary | ICD-10-CM | POA: Diagnosis not present

## 2019-05-07 DIAGNOSIS — H43823 Vitreomacular adhesion, bilateral: Secondary | ICD-10-CM | POA: Diagnosis not present

## 2019-05-07 DIAGNOSIS — H35033 Hypertensive retinopathy, bilateral: Secondary | ICD-10-CM | POA: Diagnosis not present

## 2019-05-07 DIAGNOSIS — Q141 Congenital malformation of retina: Secondary | ICD-10-CM | POA: Diagnosis not present

## 2019-05-21 ENCOUNTER — Other Ambulatory Visit: Payer: Self-pay

## 2019-05-21 ENCOUNTER — Ambulatory Visit
Admission: RE | Admit: 2019-05-21 | Discharge: 2019-05-21 | Disposition: A | Discharge status: Home / Self Care | Payer: Federal, State, Local not specified - PPO | Source: Ambulatory Visit | Attending: Oncology | Admitting: Oncology

## 2019-05-21 DIAGNOSIS — Z171 Estrogen receptor negative status [ER-]: Secondary | ICD-10-CM

## 2019-05-21 DIAGNOSIS — Z853 Personal history of malignant neoplasm of breast: Secondary | ICD-10-CM | POA: Diagnosis not present

## 2019-05-21 DIAGNOSIS — R928 Other abnormal and inconclusive findings on diagnostic imaging of breast: Secondary | ICD-10-CM | POA: Diagnosis not present

## 2019-05-21 DIAGNOSIS — C50212 Malignant neoplasm of upper-inner quadrant of left female breast: Secondary | ICD-10-CM

## 2019-05-21 HISTORY — DX: Personal history of antineoplastic chemotherapy: Z92.21

## 2019-05-21 HISTORY — DX: Personal history of irradiation: Z92.3

## 2019-05-25 ENCOUNTER — Inpatient Hospital Stay: Payer: Federal, State, Local not specified - PPO | Admitting: Oncology

## 2019-05-25 ENCOUNTER — Inpatient Hospital Stay: Payer: Federal, State, Local not specified - PPO

## 2019-06-01 DIAGNOSIS — E1169 Type 2 diabetes mellitus with other specified complication: Secondary | ICD-10-CM | POA: Diagnosis not present

## 2019-06-13 NOTE — Progress Notes (Signed)
[facility]  [phone] [phone]     ID: Cassandra Sanders DOB: 06/24/1965  MR#: [id number]  OMV#:[id number]  Patient Care Team: Lennie Odor, Kennard as PCP - General (Nurse Practitioner) Rockwell Germany, RN as Oncology Nurse Navigator Mauro Kaufmann, RN as Oncology Nurse Navigator Haru Anspaugh, Virgie Dad, MD as Consulting Physician (Oncology) Rolm Bookbinder, MD as Consulting Physician (General Surgery) Kyung Rudd, MD as Consulting Physician (Radiation Oncology) Marylynn Pearson, MD as Consulting Physician (Obstetrics and Gynecology) OTHER MD:  CHIEF COMPLAINT: Triple negative breast cancer  CURRENT TREATMENT: Observation   INTERVAL HISTORY: Cassandra Sanders returns today for a follow-up of her triple negative breast cancer.  She is now under observation.  Since her last visit, she underwent bilateral diagnostic mammography with tomography at [facility] on 05/21/2019 showing: breast density category B; no evidence of malignancy in either breast.    REVIEW OF SYSTEMS: Cassandra Sanders walks between 30 and 40 minutes about 4 or 5 days a week.  Her hair is back quite curly and she is very pleased with it.  She is mostly working from home but about 1-1/2 days a week she works in the office.  She has had her Pfizer vaccines.  Her son has moved out and is living in [place] currently.  Cassandra Sanders has her own home now which she designed and built.  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  She is thinking of getting married next year.   HISTORY OF CURRENT ILLNESS: From the original intake note:  Cassandra Sanders had routine screening mammography on 05/15/2018 at [facility] showing a possible abnormality in the left breast. She underwent bilateral diagnostic mammography with tomography and left breast ultrasonography at [facility] on 06/02/2018 showing: breast density category B; suspicious approximate 0.9 cm mass involving the upper  inner quadrant of the left breast at posterior depth which account for the screening mammographic finding; no pathologic left axillary lymphadenopathy.  Accordingly on 06/04/2018 she proceeded to biopsy of the left breast area in question. The pathology from this procedure ([id number]) showed: invasive ductal carcinoma, grade 3. Prognostic indicators significant for: estrogen receptor, 60% positive with moderate staining intensity and progesterone receptor, 0% negative. Proliferation marker Ki67 at 80%. HER2 negative by immunohistochemistry (0).  The patient's subsequent history is as detailed below.   MEDICAL HISTORY: Past Medical History:  Diagnosis Date  . Breast cancer ([place]) 06/04/2018   left breast  . Diabetes mellitus without complication ([place])   . Hypertension   . Personal history of chemotherapy 07/17/2018-10/09/2018   breast cancer  . Personal history of radiation therapy 11/09/2018-12/25/2018   left breast    SURGICAL HISTORY: Past Surgical History:  Procedure Laterality Date  . BREAST BIOPSY    . BREAST LUMPECTOMY Left 06/26/2018  . BREAST LUMPECTOMY WITH RADIOACTIVE SEED AND SENTINEL LYMPH NODE BIOPSY Left 06/26/2018   Procedure: LEFT BREAST LUMPECTOMY WITH RADIOACTIVE SEED AND LEFT AXILLARY SENTINEL LYMPH NODE BIOPSY;  Surgeon: Rolm Bookbinder, MD;  Location: [facility];  Service: General;  Laterality: Left;  . PORTACATH PLACEMENT Right 06/26/2018   Procedure: INSERTION PORT-A-CATH WITH ULTRASOUND;  Surgeon: Rolm Bookbinder, MD;  Location: [facility];  Service: General;  Laterality: Right;    FAMILY HISTORY: No family history on file. Patient's father was in his 10s when he was murdered. Patient's mother is currently (as of 06/2018) at age 26. The patient denies a family hx of breast or ovarian cancer. She  has 3 brothers and no biological sisters.   GYNECOLOGIC HISTORY:  Menarche: 54 years old Age at first live birth: 54 years  old [place] P 1 LMP June 2015 Contraceptive used from age [age] with no complications HRT none Hysterectomy? no BSO? 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  She is also writing novels.  She is engaged to [facility]. She built her own home and is living there by herself and enjoying it.  Her son [name], age 44, currently lives in [place] working as a Museum/gallery exhibitions officer.  The patient attends [employer].              ADVANCED DIRECTIVES: not in place; at the 06/14/2019 visit this was discussed in she intends to name her son is her healthcare power of attorney.  She was given the appropriate documents to complete and notarized at her discretion   HEALTH MAINTENANCE:  Social History   Tobacco Use  . Smoking status: Never Smoker  . Smokeless tobacco: Never Used  Substance Use Topics  . Alcohol use: No  . Drug use: No    No Known Allergies   Current Outpatient Medications  Medication Sig Dispense Refill  . aspirin EC 81 MG tablet Take 81 mg by mouth daily.    Marland Kitchen atorvastatin (LIPITOR) 10 MG tablet Take 10 mg by mouth daily.    Marland Kitchen JANUVIA 100 MG tablet daily.     Marland Kitchen JARDIANCE 25 MG TABS tablet Take 25 mg by mouth daily.    Marland Kitchen lisinopril (PRINIVIL,ZESTRIL) 10 MG tablet Take 10 mg by mouth daily.  2  . METFORMIN HCL PO Take 1,000 mg by mouth daily.     Marland Kitchen omeprazole (PRILOSEC) 40 MG capsule TAKE 1 CAPSULE BY MOUTH EVERY DAY 30 capsule 0   No current facility-administered medications for this visit.    PHYSICAL EXAMINATION: African-American woman in no acute distress Vitals:   06/14/19 1418  BP: (!) 152/93  Pulse: (!) 102  Resp: 20  Temp: 97.8 F (36.6 C)  SpO2: 100%  Body mass index is 39.98 kg/m. Filed Weights   06/14/19 1418  Weight: 270 lb 11.2 oz (122.8 kg)    ECOG PERFORMANCE STATUS: 1 - Symptomatic but completely ambulatory  Sclerae unicteric, EOMs  intact Wearing a mask No cervical or supraclavicular adenopathy Lungs no rales or rhonchi Heart regular rate and rhythm Abd soft, nontender, positive bowel sounds MSK no focal spinal tenderness, no upper extremity lymphedema Neuro: nonfocal, well oriented, appropriate affect Breasts: The right breast is unremarkable the left breast is status post lumpectomy and radiation.  There is minimal hyperpigmentation.  The cosmetic result is excellent.  Both axillae are benign.   LABORATORY DATA: Lab Results  Component Value Date   WBC 5.2 06/14/2019   HGB 13.3 06/14/2019   HCT 40.6 06/14/2019   MCV 92.7 06/14/2019   PLT 295 06/14/2019      Chemistry      Component Value Date/Time   NA 141 02/22/2019 0954   K 4.2 02/22/2019 0954   CL 105 02/22/2019 0954   CO2 27 02/22/2019 0954   BUN 9 02/22/2019 0954   CREATININE 0.96 02/22/2019 0954   CREATININE 1.14 (H) 06/10/2018 0859      Component Value Date/Time   CALCIUM 9.3 02/22/2019 0954   ALKPHOS 95 02/22/2019 0954   AST 16 02/22/2019 0954   AST 13 (L) 06/10/2018  0859   ALT 22 02/22/2019 0954   ALT 17 06/10/2018 0859   BILITOT 0.3 02/22/2019 0954   BILITOT 0.3 06/10/2018 0859       RADIOGRAPHIC STUDIES:  MM DIAG BREAST TOMO BILATERAL  Result Date: 05/21/2019 CLINICAL DATA:  LEFT lumpectomy with radiation treatment and chemotherapy June 2020. EXAM: DIGITAL DIAGNOSTIC BILATERAL MAMMOGRAM WITH CAD AND TOMO COMPARISON:  06/26/2018 and earlier ACR Breast Density Category b: There are scattered areas of fibroglandular density. FINDINGS: Post operative changes are seen in the LEFTbreast. No suspicious mass, distortion, or microcalcifications are identified to suggest presence of malignancy. Mammographic images were processed with CAD. IMPRESSION: No mammographic evidence for malignancy. RECOMMENDATION: Diagnostic mammogram is suggested in 1 year. (Code:DM-B-01Y) I have discussed the findings and recommendations with the patient. If  applicable, a reminder letter will be sent to the patient regarding the next appointment. BI-RADS CATEGORY  2: Benign. Electronically Signed   By: Nolon Nations M.D.   On: 05/21/2019 09:14     ELIGIBLE FOR AVAILABLE RESEARCH PROTOCOL: no  ASSESSMENT: 54 y.o. Cassandra Sanders woman status post left breast upper inner quadrant biopsy 06/04/2018 for a clinical T1b N0, stage IB invasive ductal carcinoma, grade 3, estrogen receptor moderately positive, progesterone receptor negative, with an MIB-1 of 80%, and HER-2 nonamplified  (1) left lumpectomy and sentinel lymph node sampling 06/26/2018 showed a pT1c pN0, stage IB invasive ductal carcinoma, grade 3, with negative margins             (a) repeat prognostic panel triple negative             (b) a total of 3 sentinel lymph nodes were removed  (2) Oncotype score of 56 obtained from the 06/04/2018 biopsy predicts a risk of outside the breast recurrence over the next 9 years of nearly 40% if the patient's only systemic therapy is antiestrogens for 5 years.  It also predicts a significant benefit from chemotherapy.             (a) Oncotype reads the tumor also as triple negative.  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    PLAN: Est[place] is now a year out from definitive surgery for her breast cancer with no evidence of disease recurrence.  This is very favorable.  She has an excellent exercise program.  She has a wonderful new home.  She is contemplating marriage.  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  She knows to call for any other issue that may develop before then  Total encounter time 25 minutes.Sarajane Jews C. Arlo Butt, MD 06/14/19 2:35 PM Medical Oncology and Hematology [facility] [place], [place] [address] Tel. [phone]    Fax. [phone]   I, Wilburn Mylar, am acting as scribe for Dr. Virgie Dad. Eisley Barber.  I, Lurline Del MD, have reviewed the above documentation for accuracy and completeness, and I agree with the  above.    *Total Encounter Time as defined by the Centers for Medicare and Medicaid Services includes, in addition to the face-to-face time of a patient visit (documented in the note above) non-face-to-face time: obtaining and reviewing outside history, ordering and reviewing medications, tests or procedures, care coordination (communications with other health care professionals or caregivers) and documentation in the medical record.

## 2019-06-14 ENCOUNTER — Inpatient Hospital Stay: Payer: Federal, State, Local not specified - PPO | Admitting: Oncology

## 2019-06-14 ENCOUNTER — Other Ambulatory Visit: Payer: Self-pay

## 2019-06-14 ENCOUNTER — Inpatient Hospital Stay: Payer: Federal, State, Local not specified - PPO | Attending: Oncology

## 2019-06-14 VITALS — BP 152/93 | HR 102 | Temp 97.8°F | Resp 20 | Ht 69.0 in | Wt 270.7 lb

## 2019-06-14 DIAGNOSIS — Z171 Estrogen receptor negative status [ER-]: Secondary | ICD-10-CM | POA: Insufficient documentation

## 2019-06-14 DIAGNOSIS — Z923 Personal history of irradiation: Secondary | ICD-10-CM | POA: Diagnosis not present

## 2019-06-14 DIAGNOSIS — Z79899 Other long term (current) drug therapy: Secondary | ICD-10-CM | POA: Diagnosis not present

## 2019-06-14 DIAGNOSIS — C50212 Malignant neoplasm of upper-inner quadrant of left female breast: Secondary | ICD-10-CM | POA: Diagnosis not present

## 2019-06-14 DIAGNOSIS — Z170 Estrogen receptor positive status [ER+]: Secondary | ICD-10-CM

## 2019-06-14 LAB — CBC WITH DIFFERENTIAL/PLATELET
Abs Immature Granulocytes: 0.02 10*3/uL (ref 0.00–0.07)
Basophils Absolute: 0 10*3/uL (ref 0.0–0.1)
Basophils Relative: 1 %
Eosinophils Absolute: 0.1 10*3/uL (ref 0.0–0.5)
Eosinophils Relative: 3 %
HCT: 40.6 % (ref 36.0–46.0)
Hemoglobin: 13.3 g/dL (ref 12.0–15.0)
Immature Granulocytes: 0 %
Lymphocytes Relative: 30 %
Lymphs Abs: 1.6 10*3/uL (ref 0.7–4.0)
MCH: 30.4 pg (ref 26.0–34.0)
MCHC: 32.8 g/dL (ref 30.0–36.0)
MCV: 92.7 fL (ref 80.0–100.0)
Monocytes Absolute: 0.4 10*3/uL (ref 0.1–1.0)
Monocytes Relative: 7 %
Neutro Abs: 3.1 10*3/uL (ref 1.7–7.7)
Neutrophils Relative %: 59 %
Platelets: 295 10*3/uL (ref 150–400)
RBC: 4.38 MIL/uL (ref 3.87–5.11)
RDW: 13.1 % (ref 11.5–15.5)
WBC: 5.2 10*3/uL (ref 4.0–10.5)
nRBC: 0 % (ref 0.0–0.2)

## 2019-06-14 LAB — COMPREHENSIVE METABOLIC PANEL
ALT: 24 U/L (ref 0–44)
AST: 17 U/L (ref 15–41)
Albumin: 4 g/dL (ref 3.5–5.0)
Alkaline Phosphatase: 124 U/L (ref 38–126)
Anion gap: 10 (ref 5–15)
BUN: 9 mg/dL (ref 6–20)
CO2: 27 mmol/L (ref 22–32)
Calcium: 10.2 mg/dL (ref 8.9–10.3)
Chloride: 105 mmol/L (ref 98–111)
Creatinine, Ser: 1 mg/dL (ref 0.44–1.00)
GFR calc Af Amer: >60 mL/min (ref >60)
GFR calc non Af Amer: >60 mL/min (ref >60)
Glucose, Bld: 117 mg/dL — ABNORMAL HIGH (ref 70–99)
Potassium: 4 mmol/L (ref 3.5–5.1)
Sodium: 142 mmol/L (ref 135–145)
Total Bilirubin: 0.3 mg/dL (ref 0.3–1.2)
Total Protein: 7.9 g/dL (ref 6.5–8.1)

## 2019-06-18 DIAGNOSIS — Z01419 Encounter for gynecological examination (general) (routine) without abnormal findings: Secondary | ICD-10-CM | POA: Diagnosis not present

## 2019-09-18 DIAGNOSIS — Z20822 Contact with and (suspected) exposure to covid-19: Secondary | ICD-10-CM | POA: Diagnosis not present

## 2019-09-22 DIAGNOSIS — I10 Essential (primary) hypertension: Secondary | ICD-10-CM | POA: Diagnosis not present

## 2019-09-22 DIAGNOSIS — E1169 Type 2 diabetes mellitus with other specified complication: Secondary | ICD-10-CM | POA: Diagnosis not present

## 2019-09-22 DIAGNOSIS — H35033 Hypertensive retinopathy, bilateral: Secondary | ICD-10-CM | POA: Diagnosis not present

## 2019-09-22 DIAGNOSIS — E113211 Type 2 diabetes mellitus with mild nonproliferative diabetic retinopathy with macular edema, right eye: Secondary | ICD-10-CM | POA: Diagnosis not present

## 2019-11-22 ENCOUNTER — Inpatient Hospital Stay: Payer: Federal, State, Local not specified - PPO | Attending: Adult Health | Admitting: Adult Health

## 2019-11-22 ENCOUNTER — Encounter: Payer: Self-pay | Admitting: Adult Health

## 2019-11-22 ENCOUNTER — Other Ambulatory Visit: Payer: Self-pay

## 2019-11-22 VITALS — BP 123/86 | HR 95 | Temp 98.0°F | Resp 18 | Ht 69.0 in | Wt 271.1 lb

## 2019-11-22 DIAGNOSIS — Z170 Estrogen receptor positive status [ER+]: Secondary | ICD-10-CM | POA: Diagnosis not present

## 2019-11-22 DIAGNOSIS — Z171 Estrogen receptor negative status [ER-]: Secondary | ICD-10-CM | POA: Diagnosis not present

## 2019-11-22 DIAGNOSIS — C50212 Malignant neoplasm of upper-inner quadrant of left female breast: Secondary | ICD-10-CM | POA: Insufficient documentation

## 2019-11-22 DIAGNOSIS — Z9221 Personal history of antineoplastic chemotherapy: Secondary | ICD-10-CM | POA: Insufficient documentation

## 2019-11-22 DIAGNOSIS — G620 Drug-induced polyneuropathy: Secondary | ICD-10-CM | POA: Diagnosis not present

## 2019-11-22 DIAGNOSIS — Z923 Personal history of irradiation: Secondary | ICD-10-CM | POA: Diagnosis not present

## 2019-11-22 NOTE — Progress Notes (Signed)
SURVIVORSHIP VISIT:    BRIEF ONCOLOGIC HISTORY:  Oncology History  Malignant neoplasm of upper-inner quadrant of left breast in female, estrogen receptor negative ([place])  06/04/2018 Oncotype testing   The Oncotype DX score was 56 predicting a risk of outside the breast recurrence over the next 9 years of >39% if the patient's only systemic therapy is tamoxifen for 5 years.    06/08/2018 Initial Diagnosis   Malignant neoplasm of upper-inner quadrant of left breast in female, estrogen receptor positive ([place])   06/26/2018 Cancer Staging   Staging form: Breast, AJCC 8th Edition - Pathologic stage from 06/26/2018: Stage IB (pT1c, pN0, cM0, G3, ER-, PR-, HER2-)   06/26/2018 Surgery   Left lumpectomy Donne Hazel) [phone]): IDC grade 3, 1.5 cm, with high grade DCIS. Negative margins. 3/3 lymph nodes negative for carcinoma. Triple negative.   07/17/2018 - 10/09/2018 Chemotherapy   dexamethasone (DECADRON) 4 MG tablet, 1 of 1 cycle, Start date: 07/14/2018, End date: 09/18/2018  DOXOrubicin (ADRIAMYCIN) chemo injection 152 mg, 60 mg/m2 = 152 mg, Intravenous,  Once, 4 of 4 cycles. Administration: 152 mg (07/17/2018), 152 mg (07/31/2018), 152 mg (08/21/2018), 152 mg (09/03/2018)  palonosetron (ALOXI) injection 0.25 mg, 0.25 mg, Intravenous,  Once, 8 of 16 cycles. Administration: 0.25 mg (07/17/2018), 0.25 mg (09/18/2018), 0.25 mg (09/25/2018), 0.25 mg (10/02/2018), 0.25 mg (10/09/2018), 0.25 mg (07/31/2018), 0.25 mg (08/21/2018), 0.25 mg (09/03/2018)  pegfilgrastim (NEULASTA ONPRO KIT) injection 6 mg, 6 mg, Subcutaneous, Once, 2 of 2 cycles. Administration: 6 mg (07/17/2018), 6 mg (07/31/2018)  pegfilgrastim-cbqv (UDENYCA) injection 6 mg, 6 mg, Subcutaneous, Once, 2 of 2 cycles. Administration: 6 mg (08/22/2018), 6 mg (09/05/2018)  CARBOplatin (PARAPLATIN) 300 mg in sodium chloride 0.9 % 250 mL chemo infusion, 300 mg (100 % of original dose 300 mg), Intravenous,  Once, 4 of 12 cycles. Dose modification:   (original dose  300 mg, Cycle 5). Administration: 300 mg (09/18/2018), 300 mg (09/25/2018), 300 mg (10/02/2018), 300 mg (10/09/2018)  cyclophosphamide (CYTOXAN) 1,520 mg in sodium chloride 0.9 % 250 mL chemo infusion, 600 mg/m2 = 1,520 mg, Intravenous,  Once, 4 of 4 cycles. Administration: 1,520 mg (07/17/2018), 1,520 mg (07/31/2018), 1,520 mg (08/21/2018), 1,520 mg (09/03/2018)  PACLitaxel (TAXOL) 204 mg in sodium chloride 0.9 % 250 mL chemo infusion (</= $RemoveBefor'80mg'ueKebPWWUJZa$ /m2), 80 mg/m2 = 204 mg, Intravenous,  Once, 4 of 12 cycles. Administration: 204 mg (09/18/2018), 204 mg (09/25/2018), 204 mg (10/02/2018), 204 mg (10/09/2018)  fosaprepitant (EMEND) 150 mg   dexamethasone (DECADRON) 12 mg in sodium chloride 0.9 % 145 mL IVPB, , Intravenous,  Once, 8 of 16 cycles. Administration:  (07/17/2018),  (09/18/2018),  (09/25/2018),  (10/02/2018),  (10/09/2018),  (07/31/2018),  (08/21/2018),  (09/03/2018)   11/09/2018 - 12/24/2018 Radiation Therapy   The patient initially received a dose of 50.4 Gy in 28 fractions to the breast using whole-breast tangent fields. This was delivered using a 3-D conformal technique. The pt received a boost delivering an additional 10 Gy in 5 fractions using a electron boost with 38meV electrons. The total dose was 60.4 Gy.     INTERVAL HISTORY:  Cassandra Sanders to review her survivorship care plan detailing her treatment course for breast cancer, as well as monitoring long-term side effects of that treatment, education regarding health maintenance, screening, and overall wellness and health promotion.     Overall, Ms. Cheek reports feeling quite well.  She underwent a mammogram on 05/21/2019 that showed no evidence of malignancy and breast density category B. She completed her survivorship survey and notes  hot flashes intermittently from perimenopause and continued peripheral neuropathy in her toes from chemo she manages with exercise.  REVIEW OF SYSTEMS:  Review of Systems  Constitutional: Negative for appetite change,  chills, fatigue, fever and unexpected weight change.  HENT:   Negative for hearing loss, lump/mass and trouble swallowing.   Eyes: Negative for eye problems and icterus.  Respiratory: Negative for chest tightness, cough and shortness of breath.   Cardiovascular: Negative for chest pain, leg swelling and palpitations.  Gastrointestinal: Negative for abdominal distention, abdominal pain, constipation, diarrhea, nausea and vomiting.  Endocrine: Positive for hot flashes.  Genitourinary: Negative for difficulty urinating.   Musculoskeletal: Negative for arthralgias.  Skin: Negative for itching and rash.  Neurological: Positive for numbness. Negative for dizziness, extremity weakness and headaches.  Hematological: Negative for adenopathy. Does not bruise/bleed easily.  Psychiatric/Behavioral: Negative for depression. The patient is not nervous/anxious.   Breast: Denies any new nodularity, masses, tenderness, nipple changes, or nipple discharge.      ONCOLOGY TREATMENT TEAM:  1. Surgeon:  Dr. Donne [facility]el at [facility] 2. Medical Oncologist: Dr. Jana Hakim 3. Radiation Oncologist: Dr. Lisbeth Renshaw    PAST MEDICAL/SURGICAL HISTORY:  Past Medical History:  Diagnosis Date  . Breast cancer ([place]) 06/04/2018   left breast  . Diabetes mellitus without complication ([place])   . Hypertension   . Personal history of chemotherapy 07/17/2018-10/09/2018   breast cancer  . Personal history of radiation therapy 11/09/2018-12/25/2018   left breast   Past Surgical History:  Procedure Laterality Date  . BREAST BIOPSY    . BREAST LUMPECTOMY Left 06/26/2018  . BREAST LUMPECTOMY WITH RADIOACTIVE SEED AND SENTINEL LYMPH NODE BIOPSY Left 06/26/2018   Procedure: LEFT BREAST LUMPECTOMY WITH RADIOACTIVE SEED AND LEFT AXILLARY SENTINEL LYMPH NODE BIOPSY;  Surgeon: Rolm Bookbinder, MD;  Location: [facility];  Service: General;  Laterality: Left;  . PORTACATH PLACEMENT Right 06/26/2018    Procedure: INSERTION PORT-A-CATH WITH ULTRASOUND;  Surgeon: Rolm Bookbinder, MD;  Location: [facility];  Service: General;  Laterality: Right;     ALLERGIES:  No Known Allergies   CURRENT MEDICATIONS:  Outpatient Encounter Medications as of 11/22/2019  Medication Sig  . aspirin EC 81 MG tablet Take 81 mg by mouth daily.  Marland Kitchen atorvastatin (LIPITOR) 10 MG tablet Take 10 mg by mouth daily.  Marland Kitchen JARDIANCE 25 MG TABS tablet Take 25 mg by mouth daily.  Marland Kitchen lisinopril (PRINIVIL,ZESTRIL) 10 MG tablet Take 10 mg by mouth daily.  Marland Kitchen METFORMIN HCL PO Take 1,000 mg by mouth daily.   . [DISCONTINUED] JANUVIA 100 MG tablet daily.   . [DISCONTINUED] omeprazole (PRILOSEC) 40 MG capsule TAKE 1 CAPSULE BY MOUTH EVERY DAY (Patient not taking: Reported on 11/22/2019)  . [DISCONTINUED] prochlorperazine (COMPAZINE) 10 MG tablet Take 1 tablet (10 mg total) by mouth every 6 (six) hours as needed (Nausea or vomiting).   No facility-administered encounter medications on file as of 11/22/2019.     ONCOLOGIC FAMILY HISTORY:  Non contributory   GENETIC COUNSELING/TESTING: Not at this time  SOCIAL HISTORY:  Social History   Socioeconomic History  . Marital status: Single    Spouse name: Not on file  . Number of children: Not on file  . Years of education: Not on file  . Highest education level: Not on file  Occupational History  . Not on file  Tobacco Use  . Smoking status: Never Smoker  . Smokeless tobacco: Never Used  Substance and Sexual Activity  . Alcohol  use: No  . Drug use: No  . Sexual activity: Yes    Birth control/protection: Post-menopausal  Other Topics Concern  . Not on file  Social History Narrative  . Not on file   Social Determinants of Health   Financial Resource Strain:   . Difficulty of Paying Living Expenses: Not on file  Food Insecurity:   . Worried About Charity fundraiser in the Last Year: Not on file  . Ran Out of Food in the Last Year: Not on file   Transportation Needs:   . Lack of Transportation (Medical): Not on file  . Lack of Transportation (Non-Medical): Not on file  Physical Activity:   . Days of Exercise per Week: Not on file  . Minutes of Exercise per Session: Not on file  Stress:   . Feeling of Stress : Not on file  Social Connections:   . Frequency of Communication with Friends and Family: Not on file  . Frequency of Social Gatherings with Friends and Family: Not on file  . Attends Religious Services: Not on file  . Active Member of Clubs or Organizations: Not on file  . Attends Archivist Meetings: Not on file  . Marital Status: Not on file  Intimate Partner Violence:   . Fear of Current or Ex-Partner: Not on file  . Emotionally Abused: Not on file  . Physically Abused: Not on file  . Sexually Abused: Not on file     OBSERVATIONS/OBJECTIVE:  BP 123/86 (BP Location: Right Arm, Patient Position: Sitting)   Pulse 95   Temp 98 F (36.7 C) (Tympanic)   Resp 18   Ht $R'5\' 9"'AI$  (1.753 m)   Wt 271 lb 1.6 oz (123 kg)   LMP 06/18/2013   SpO2 98%   BMI 40.03 kg/m  GENERAL: Patient is a well appearing female in no acute distress HEENT:  Sclerae anicteric. Mask in place. Neck is supple.  NODES:  No cervical, supraclavicular, or axillary lymphadenopathy palpated.  BREAST EXAM:  Left breast s/p lumpectomy and radiation, no sign of local recurrence. LUNGS:  Clear to auscultation bilaterally.  No wheezes or rhonchi. HEART:  Regular rate and rhythm. No murmur appreciated. ABDOMEN:  Soft, nontender.  Positive, normoactive bowel sounds. No organomegaly palpated. MSK:  No focal spinal tenderness to palpation. Full range of motion bilaterally in the upper extremities. EXTREMITIES:  No peripheral edema.   SKIN:  Clear with no obvious rashes or skin changes. No nail dyscrasia. NEURO:  Nonfocal. Well oriented.  Appropriate affect.    LABORATORY DATA:  None for this visit.  DIAGNOSTIC IMAGING:    CLINICAL DATA:   LEFT lumpectomy with radiation treatment and chemotherapy June 2020.  EXAM: DIGITAL DIAGNOSTIC BILATERAL MAMMOGRAM WITH CAD AND TOMO  COMPARISON:  06/26/2018 and earlier  ACR Breast Density Category b: There are scattered areas of fibroglandular density.  FINDINGS: Post operative changes are seen in the LEFTbreast. No suspicious mass, distortion, or microcalcifications are identified to suggest presence of malignancy.  Mammographic images were processed with CAD.  IMPRESSION: No mammographic evidence for malignancy.  RECOMMENDATION: Diagnostic mammogram is suggested in 1 year. (Code:[id number]-B-01Y)  I have discussed the findings and recommendations with the patient. If applicable, a reminder letter will be sent to the patient regarding the next appointment.  BI-RADS CATEGORY  2: Benign.   Electronically Signed   By: Nolon Nations M.D.   On: 05/21/2019 09:14    ASSESSMENT AND PLAN:  Ms.. Atilano Sanders is a pleasant 54  y.o. female with Stage IB left breast invasive ductal carcinoma, ER-/PR-/HER2-, diagnosed in 06/2018, treated with lumpectomy, adjuvant chemotherapy, adjuvant radiation therapy.  She presents to the Survivorship Clinic for our initial meeting and routine follow-up post-completion of treatment for breast cancer.    1. Stage IB left breast cancer:  Cassandra Sanders is continuing to recover from definitive treatment for breast cancer. She will follow-up with her medical oncologist, Dr. Jana Hakim in 06/2020 with history and physical exam per surveillance protocol.   Her mammogram is due 05/2020; orders placed today. She has not been referred to genetics, and I placed that referral today.  Today, a comprehensive survivorship care plan and treatment summary was reviewed with the patient today detailing her breast cancer diagnosis, treatment course, potential late/long-term effects of treatment, appropriate follow-up care with recommendations for the future, and patient education  resources.  A copy of this summary, along with a letter will be sent to the patient's primary care provider via mail/fax/In Basket message after today's visit.    2. Chemotherapy induced peripheral neuropathy. She continues to exercise in order to manage this.  She will continue this.  3. Bone health:   She was given education on specific activities to promote bone health.  4. Cancer screening:  Due to Ms. Cheek's history and her age, she should receive screening for skin cancers, colon cancer, and gynecologic cancers.  The information and recommendations are listed on the patient's comprehensive care plan/treatment summary and were reviewed in detail with the patient.    5. Health maintenance and wellness promotion: Cassandra Sanders was encouraged to consume 5-7 servings of fruits and vegetables per day. We reviewed the "Nutrition Rainbow" handout, as well as the handout "Take Control of Your Health and Reduce Your Cancer Risk" from the [employer].  She was also encouraged to engage in moderate to vigorous exercise for 30 minutes per day most days of the week. We discussed the [employer] fitness program, which is designed for cancer survivors to help them become more physically fit after cancer treatments.  She was instructed to limit her alcohol consumption and continue to abstain from tobacco use.     6. Support services/counseling: It is not uncommon for this period of the patient's cancer care trajectory to be one of many emotions and stressors.  We discussed how this can be increasingly difficult during the times of quarantine and social distancing due to the COVID-19 pandemic.   She was given information regarding our available services and encouraged to contact me with any questions or for help enrolling in any of our support group/programs.    Follow up instructions:    -Return to cancer center in 06/2020 for f/u with Dr. Jana Hakim  -Mammogram due in 05/2020 -She is welcome to  return back to the Survivorship Clinic at any time; no additional follow-up needed at this time.  -Consider referral back to survivorship as a long-term survivor for continued surveillance  The patient was provided an opportunity to ask questions and all were answered. The patient agreed with the plan and demonstrated an understanding of the instructions.   Total encounter time: 30 minutes*  Wilber Bihari, NP 11/22/19 11:30 AM Medical Oncology and Hematology [facility] [place], [place] [address] Tel. [phone]    Fax. [phone]  *Total Encounter Time as defined by the Centers for Medicare and Medicaid Services includes, in addition to the face-to-face time of a patient visit (documented in the note above) non-face-to-face  time: obtaining and reviewing outside history, ordering and reviewing medications, tests or procedures, care coordination (communications with other health care professionals or caregivers) and documentation in the medical record.

## 2019-11-23 ENCOUNTER — Telehealth: Payer: Self-pay | Admitting: Adult Health

## 2019-11-23 NOTE — Telephone Encounter (Signed)
Scheduled appts per 11/15 los. Pt confirmed appt date and time.

## 2019-11-27 DIAGNOSIS — Z20822 Contact with and (suspected) exposure to covid-19: Secondary | ICD-10-CM | POA: Diagnosis not present

## 2019-12-29 DIAGNOSIS — E1169 Type 2 diabetes mellitus with other specified complication: Secondary | ICD-10-CM | POA: Diagnosis not present

## 2019-12-29 DIAGNOSIS — Z23 Encounter for immunization: Secondary | ICD-10-CM | POA: Diagnosis not present

## 2020-01-04 ENCOUNTER — Inpatient Hospital Stay: Payer: Federal, State, Local not specified - PPO | Attending: Adult Health | Admitting: Licensed Clinical Social Worker

## 2020-01-04 ENCOUNTER — Encounter: Payer: Self-pay | Admitting: Licensed Clinical Social Worker

## 2020-01-04 DIAGNOSIS — C50212 Malignant neoplasm of upper-inner quadrant of left female breast: Secondary | ICD-10-CM

## 2020-01-04 DIAGNOSIS — Z171 Estrogen receptor negative status [ER-]: Secondary | ICD-10-CM

## 2020-01-04 DIAGNOSIS — Z803 Family history of malignant neoplasm of breast: Secondary | ICD-10-CM | POA: Insufficient documentation

## 2020-01-04 DIAGNOSIS — Z8042 Family history of malignant neoplasm of prostate: Secondary | ICD-10-CM

## 2020-01-04 NOTE — Progress Notes (Signed)
REFERRING PROVIDER: Gardenia Phlegm, NP [place],  [place] [address]  PRIMARY PROVIDER:  Lennie Odor, [place]  PRIMARY REASON FOR VISIT:  1. Malignant neoplasm of upper-inner quadrant of left breast in female, estrogen receptor negative ([place])   2. Family history of breast cancer   3. Family history of prostate cancer     I connected with Cassandra Sanders on 01/04/2020 at 8:55 AM EDT by MyChart video conference and verified that I am speaking with the correct person using two identifiers.    Patient location: home Provider location: [place]:   Cassandra Sanders, a 54 y.o. female, was seen for a [facility] cancer genetics consultation at the request of Wilber Bihari, NP due to a personal and family history of cancer.  Cassandra Sanders presents to clinic today to discuss the possibility of a hereditary predisposition to cancer, genetic testing, and to further clarify her future cancer risks, as well as potential cancer risks for family members.   In 2020, at the age of 9, Cassandra Sanders was diagnosed with invasive ductal carcinoma of the left breast, triple negative. The treatment plan included lumpectomy, adjuvant chemotherapy and adjuvant radiation therapy.   CANCER HISTORY:  Oncology History  Malignant neoplasm of upper-inner quadrant of left breast in female, estrogen receptor negative ([place])  06/04/2018 Oncotype testing   The Oncotype DX score was 56 predicting a risk of outside the breast recurrence over the next 9 years of >39% if the patient's only systemic therapy is tamoxifen for 5 years.    06/08/2018 Initial Diagnosis   Malignant neoplasm of upper-inner quadrant of left breast in female, estrogen receptor positive ([place])   06/26/2018 Cancer Staging   Staging form: Breast, AJCC 8th Edition - Pathologic stage from 06/26/2018: Stage IB (pT1c, pN0, cM0, G3, ER-, PR-, HER2-)   06/26/2018 Surgery   Left lumpectomy Donne Hazel) [phone]): IDC  grade 3, 1.5 cm, with high grade DCIS. Negative margins. 3/3 lymph nodes negative for carcinoma. Triple negative.   07/17/2018 - 10/09/2018 Chemotherapy   dexamethasone (DECADRON) 4 MG tablet, 1 of 1 cycle, Start date: 07/14/2018, End date: 09/18/2018  DOXOrubicin (ADRIAMYCIN) chemo injection 152 mg, 60 mg/m2 = 152 mg, Intravenous,  Once, 4 of 4 cycles. Administration: 152 mg (07/17/2018), 152 mg (07/31/2018), 152 mg (08/21/2018), 152 mg (09/03/2018)  palonosetron (ALOXI) injection 0.25 mg, 0.25 mg, Intravenous,  Once, 8 of 16 cycles. Administration: 0.25 mg (07/17/2018), 0.25 mg (09/18/2018), 0.25 mg (09/25/2018), 0.25 mg (10/02/2018), 0.25 mg (10/09/2018), 0.25 mg (07/31/2018), 0.25 mg (08/21/2018), 0.25 mg (09/03/2018)  pegfilgrastim (NEULASTA ONPRO KIT) injection 6 mg, 6 mg, Subcutaneous, Once, 2 of 2 cycles. Administration: 6 mg (07/17/2018), 6 mg (07/31/2018)  pegfilgrastim-cbqv (UDENYCA) injection 6 mg, 6 mg, Subcutaneous, Once, 2 of 2 cycles. Administration: 6 mg (08/22/2018), 6 mg (09/05/2018)  CARBOplatin (PARAPLATIN) 300 mg in sodium chloride 0.9 % 250 mL chemo infusion, 300 mg (100 % of original dose 300 mg), Intravenous,  Once, 4 of 12 cycles. Dose modification:   (original dose 300 mg, Cycle 5). Administration: 300 mg (09/18/2018), 300 mg (09/25/2018), 300 mg (10/02/2018), 300 mg (10/09/2018)  cyclophosphamide (CYTOXAN) 1,520 mg in sodium chloride 0.9 % 250 mL chemo infusion, 600 mg/m2 = 1,520 mg, Intravenous,  Once, 4 of 4 cycles. Administration: 1,520 mg (07/17/2018), 1,520 mg (07/31/2018), 1,520 mg (08/21/2018), 1,520 mg (09/03/2018)  PACLitaxel (TAXOL) 204 mg in sodium chloride 0.9 % 250 mL chemo infusion (</= 48m/m2), 80 mg/m2 = 204  mg, Intravenous,  Once, 4 of 12 cycles. Administration: 204 mg (09/18/2018), 204 mg (09/25/2018), 204 mg (10/02/2018), 204 mg (10/09/2018)  fosaprepitant (EMEND) 150 mg   dexamethasone (DECADRON) 12 mg in sodium chloride 0.9 % 145 mL IVPB, , Intravenous,  Once, 8 of 16 cycles.  Administration:  (07/17/2018),  (09/18/2018),  (09/25/2018),  (10/02/2018),  (10/09/2018),  (07/31/2018),  (08/21/2018),  (09/03/2018)   11/09/2018 - 12/24/2018 Radiation Therapy   The patient initially received a dose of 50.4 Gy in 28 fractions to the breast using whole-breast tangent fields. This was delivered using a 3-D conformal technique. The pt received a boost delivering an additional 10 Gy in 5 fractions using a electron boost with 20mV electrons. The total dose was 60.4 Gy.      RISK FACTORS:  Menarche was at age 89  First live birth at age 54  OCP use for approximately 7 years.  Ovaries intact: yes.  Hysterectomy: no.  Menopausal status: postmenopausal.  HRT use: 0 years. Colonoscopy: yes; normal. Mammogram within the last year: yes. Number of breast biopsies: 1. Up to date with pelvic exams: yes. Any excessive radiation exposure in the past: no  Past Medical History:  Diagnosis Date  . Breast cancer ([facility] 06/04/2018   left breast  . Diabetes mellitus without complication ([place]   . Family history of breast cancer   . Family history of prostate cancer   . Hypertension   . Personal history of chemotherapy 07/17/2018-10/09/2018   breast cancer  . Personal history of radiation therapy 11/09/2018-12/25/2018   left breast    Past Surgical History:  Procedure Laterality Date  . BREAST BIOPSY    . BREAST LUMPECTOMY Left 06/26/2018  . BREAST LUMPECTOMY WITH RADIOACTIVE SEED AND SENTINEL LYMPH NODE BIOPSY Left 06/26/2018   Procedure: LEFT BREAST LUMPECTOMY WITH RADIOACTIVE SEED AND LEFT AXILLARY SENTINEL LYMPH NODE BIOPSY;  Surgeon: WRolm Bookbinder MD;  [facility]  Service: General;  Laterality: Left;  . PORTACATH PLACEMENT Right 06/26/2018   Procedure: INSERTION PORT-A-CATH WITH ULTRASOUND;  Surgeon: WRolm Bookbinder MD;  [facility]  Service: General;  Laterality: Right;    Social History   Socioeconomic History  .  Marital status: Single    Spouse name: Not on file  . Number of children: Not on file  . Years of education: Not on file  . Highest education level: Not on file  Occupational History  . Not on file  Tobacco Use  . Smoking status: Never Smoker  . Smokeless tobacco: Never Used  Substance and Sexual Activity  . Alcohol use: No  . Drug use: No  . Sexual activity: Yes    Birth control/protection: Post-menopausal  Other Topics Concern  . Not on file  Social History Narrative  . Not on file   Social Determinants of Health   Financial Resource Strain: Not on file  Food Insecurity: Not on file  Transportation Needs: Not on file  Physical Activity: Not on file  Stress: Not on file  Social Connections: Not on file     FAMILY HISTORY:  We obtained a detailed, 4-generation family history.  Significant diagnoses are listed below: Family History  Adopted: Yes  Problem Relation Age of Onset  . Prostate cancer Brother   . Breast cancer Cousin        dx in their 517s  Cassandra Sanders was adopted. She has 1 twin brother, 1 other full brother and a maternal half brother. Her maternal half  brother had prostate cancer.   Cassandra Sanders biological mother is living at 43 with no cancer history. There is no known cancer on this side of the family.   Cassandra Sanders biological father died in his 106s. He had 3 sisters that died in a fire, along with Cassandra Sanders's grandparents. Cassandra Sanders does know that her grandmother's siblings had daughters who had breast cancer, she believes there were 46 female cousins who had breast cancer in their 25s that are survivors.   Cassandra Sanders is unaware of previous family history of genetic testing for hereditary cancer risks. Patient's maternal ancestors are of unknown descent, and paternal ancestors are of unknown descent. There no reported Ashkenazi Jewish ancestry. There is no known consanguinity.    GENETIC COUNSELING ASSESSMENT: Cassandra Sanders is a 54 y.o. female with a personal  history of triple negative breast cancer which is somewhat suggestive of a hereditary cancer syndrome and predisposition to cancer. We, therefore, discussed and recommended the following at today's visit.   DISCUSSION: We discussed that approximately 5-10% of breast cancer is hereditary  Most cases of hereditary breast cancer are associated with BRCA1/BRCA2 genes, although there are other genes associated with hereditary breast cancer as well.  We discussed that testing is beneficial for several reasons including  knowing about other cancer risks, identifying potential screening and risk-reduction options that may be appropriate, and to understand if other family members could be at risk for cancer and allow them to undergo genetic testing.   We reviewed the characteristics, features and inheritance patterns of hereditary cancer syndromes. We also discussed genetic testing, including the appropriate family members to test, the process of testing, insurance coverage and turn-around-time for results. We discussed the implications of a negative, positive and/or variant of uncertain significant result. We recommended Cassandra Sanders pursue genetic testing for the [facility] CancerNext-Expanded gene panel.   Based on Cassandra Sanders's personal and family history of cancer, she meets medical criteria for genetic testing. Despite that she meets criteria, she may still have an out of pocket cost. We discussed that if her out of pocket cost for testing is over $100, the laboratory will call and confirm whether she wants to proceed with testing.  If the out of pocket cost of testing is less than $100 she will be billed by the genetic testing laboratory.   PLAN: After considering the risks, benefits, and limitations, Cassandra Sanders provided informed consent to pursue genetic testing. A saliva kit will be mailed to her and the sample will be sent to [facility] for analysis of the CancerNext-Expanded panel. Results should be available  within approximately 2-3 weeks' time, at which point they will be disclosed by telephone to Cassandra Sanders, as will any additional recommendations warranted by these results. Cassandra Sanders will receive a summary of her genetic counseling visit and a copy of her results once available. This information will also be available in Epic.   Cassandra Sanders questions were answered to her satisfaction today. Our contact information was provided should additional questions or concerns arise. Thank you for the referral and allowing [place] to share in the care of your patient.   Cassandra Rogue, MS, [facility] Genetic Counselor [email or web address] Phone: [phone]  The patient was seen for a total of 25 minutes in face-to-face genetic counseling.  Dr. Grayland Ormond was available for discussion regarding this case.   _______________________________________________________________________ For Office Staff:  Number of people involved in session: 1 Was an Intern/ student involved with case: no

## 2020-02-02 ENCOUNTER — Ambulatory Visit: Payer: Self-pay | Admitting: Licensed Clinical Social Worker

## 2020-02-02 ENCOUNTER — Encounter: Payer: Self-pay | Admitting: Licensed Clinical Social Worker

## 2020-02-02 ENCOUNTER — Telehealth: Payer: Self-pay | Admitting: Licensed Clinical Social Worker

## 2020-02-02 DIAGNOSIS — Z1379 Encounter for other screening for genetic and chromosomal anomalies: Secondary | ICD-10-CM | POA: Insufficient documentation

## 2020-02-02 DIAGNOSIS — Z8042 Family history of malignant neoplasm of prostate: Secondary | ICD-10-CM

## 2020-02-02 DIAGNOSIS — C50212 Malignant neoplasm of upper-inner quadrant of left female breast: Secondary | ICD-10-CM

## 2020-02-02 DIAGNOSIS — Z171 Estrogen receptor negative status [ER-]: Secondary | ICD-10-CM

## 2020-02-02 DIAGNOSIS — Z803 Family history of malignant neoplasm of breast: Secondary | ICD-10-CM

## 2020-02-02 NOTE — Telephone Encounter (Signed)
Revealed negative genetic testing.  This normal result is reassuring and indicates that it is unlikely Cassandra Sanders's cancer is due to a hereditary cause.  It is unlikely that there is an increased risk of another cancer due to a mutation in one of these genes.  However, genetic testing is not perfect, and cannot definitively rule out a hereditary cause.  It will be important for her to keep in contact with genetics to learn if any additional testing may be needed in the future.

## 2020-02-02 NOTE — Progress Notes (Signed)
HPI:  [name] was previously seen in the [facility] [place] clinic due to a personal and family history of cancer and concerns regarding a hereditary predisposition to cancer. Please refer to our prior cancer genetics clinic note for more information regarding our discussion, assessment and recommendations, at the time. [name] recent genetic test results were disclosed to her, as were recommendations warranted by these results. These results and recommendations are discussed in more detail below.  CANCER HISTORY:  Oncology History  Malignant neoplasm of upper-inner quadrant of left breast in female, estrogen receptor negative ([place])  06/04/2018 Oncotype testing   The Oncotype DX score was 56 predicting a risk of outside the breast recurrence over the next 9 years of >39% if the patient's only systemic therapy is tamoxifen for 5 years.    06/08/2018 Initial Diagnosis   Malignant neoplasm of upper-inner quadrant of left breast in female, estrogen receptor positive ([place])   06/26/2018 Cancer Staging   Staging form: Breast, AJCC 8th Edition - Pathologic stage from 06/26/2018: Stage IB (pT1c, pN0, cM0, G3, ER-, PR-, HER2-)   06/26/2018 Surgery   Left lumpectomy Cassandra Sanders) [phone]): IDC grade 3, 1.5 cm, with high grade DCIS. Negative margins. 3/3 lymph nodes negative for carcinoma. Triple negative.   07/17/2018 - 10/09/2018 Chemotherapy   dexamethasone (DECADRON) 4 MG tablet, 1 of 1 cycle, Start date: 07/14/2018, End date: 09/18/2018  DOXOrubicin (ADRIAMYCIN) chemo injection 152 mg, 60 mg/m2 = 152 mg, Intravenous,  Once, 4 of 4 cycles. Administration: 152 mg (07/17/2018), 152 mg (07/31/2018), 152 mg (08/21/2018), 152 mg (09/03/2018)  palonosetron (ALOXI) injection 0.25 mg, 0.25 mg, Intravenous,  Once, 8 of 16 cycles. Administration: 0.25 mg (07/17/2018), 0.25 mg (09/18/2018), 0.25 mg (09/25/2018), 0.25 mg (10/02/2018), 0.25 mg (10/09/2018), 0.25 mg (07/31/2018), 0.25 mg (08/21/2018), 0.25 mg  (09/03/2018)  pegfilgrastim (NEULASTA ONPRO KIT) injection 6 mg, 6 mg, Subcutaneous, Once, 2 of 2 cycles. Administration: 6 mg (07/17/2018), 6 mg (07/31/2018)  pegfilgrastim-cbqv (UDENYCA) injection 6 mg, 6 mg, Subcutaneous, Once, 2 of 2 cycles. Administration: 6 mg (08/22/2018), 6 mg (09/05/2018)  CARBOplatin (PARAPLATIN) 300 mg in sodium chloride 0.9 % 250 mL chemo infusion, 300 mg (100 % of original dose 300 mg), Intravenous,  Once, 4 of 12 cycles. Dose modification:   (original dose 300 mg, Cycle 5). Administration: 300 mg (09/18/2018), 300 mg (09/25/2018), 300 mg (10/02/2018), 300 mg (10/09/2018)  cyclophosphamide (CYTOXAN) 1,520 mg in sodium chloride 0.9 % 250 mL chemo infusion, 600 mg/m2 = 1,520 mg, Intravenous,  Once, 4 of 4 cycles. Administration: 1,520 mg (07/17/2018), 1,520 mg (07/31/2018), 1,520 mg (08/21/2018), 1,520 mg (09/03/2018)  PACLitaxel (TAXOL) 204 mg in sodium chloride 0.9 % 250 mL chemo infusion (</= 23m/m2), 80 mg/m2 = 204 mg, Intravenous,  Once, 4 of 12 cycles. Administration: 204 mg (09/18/2018), 204 mg (09/25/2018), 204 mg (10/02/2018), 204 mg (10/09/2018)  fosaprepitant (EMEND) 150 mg   dexamethasone (DECADRON) 12 mg in sodium chloride 0.9 % 145 mL IVPB, , Intravenous,  Once, 8 of 16 cycles. Administration:  (07/17/2018),  (09/18/2018),  (09/25/2018),  (10/02/2018),  (10/09/2018),  (07/31/2018),  (08/21/2018),  (09/03/2018)   11/09/2018 - 12/24/2018 Radiation Therapy   The patient initially received a dose of 50.4 Gy in 28 fractions to the breast using whole-breast tangent fields. This was delivered using a 3-D conformal technique. The pt received a boost delivering an additional 10 Gy in 5 fractions using a electron boost with 159m electrons. The total dose was 60.4 Gy.    Genetic Testing  Negative genetic testing. No pathogenic variants identified on the [facility] CancerNext-Expanded panel. The report date is 01/29/2020.   The CancerNext-Expanded gene panel offered by [facility] and  includes sequencing and rearrangement analysis for the following 77 genes: IP, ALK, APC*, ATM*, AXIN2, BAP1, BARD1, BLM, BMPR1A, BRCA1*, BRCA2*, BRIP1*, CDC73, CDH1*,CDK4, CDKN1B, CDKN2A, CHEK2*, CTNNA1, DICER1, FANCC, FH, FLCN, GALNT12, KIF1B, LZTR1, MAX, MEN1, MET, MLH1*, MSH2*, MSH3, MSH6*, MUTYH*, NBN, NF1*, NF2, NTHL1, PALB2*, PHOX2B, PMS2*, POT1, PRKAR1A, PTCH1, PTEN*, RAD51C*, RAD51D*,RB1, RECQL, RET, SDHA, SDHAF2, SDHB, SDHC, SDHD, SMAD4, SMARCA4, SMARCB1, SMARCE1, STK11, SUFU, TMEM127, TP53*,TSC1, TSC2, VHL and XRCC2 (sequencing and deletion/duplication); EGFR, EGLN1, HOXB13, KIT, MITF, PDGFRA, POLD1 and POLE (sequencing only); EPCAM and GREM1 (deletion/duplication only).      FAMILY HISTORY:  We obtained a detailed, 4-generation family history.  Significant diagnoses are listed below: Family History  Adopted: Yes  Problem Relation Age of Onset  . Prostate cancer Brother   . Breast cancer Cousin        dx in their 100s   [name] was adopted. She has 1 twin brother, 1 other full brother and a maternal half brother. Her maternal half brother had prostate cancer.   [name] biological mother is living at 12 with no cancer history. There is no known cancer on this side of the family.   [name] biological father died in his 64s. He had 3 sisters that died in a fire, along with [name]'s grandparents. [name] does know that her grandmother's siblings had daughters who had breast cancer, she believes there were 60 female cousins who had breast cancer in their 28s that are survivors.   [name] is unaware of previous family history of genetic testing for hereditary cancer risks. Patient's maternal ancestors are of unknown descent, and paternal ancestors are of unknown descent. There no reported Ashkenazi Jewish ancestry. There is no known consanguinity.      GENETIC TEST RESULTS: Genetic testing reported out on 01/29/2020 through the [place] cancer panel  found no pathogenic mutations.   The CancerNext-Expanded gene panel offered by [facility] and includes sequencing and rearrangement analysis for the following 77 genes: IP, ALK, APC*, ATM*, AXIN2, BAP1, BARD1, BLM, BMPR1A, BRCA1*, BRCA2*, BRIP1*, CDC73, CDH1*,CDK4, CDKN1B, CDKN2A, CHEK2*, CTNNA1, DICER1, FANCC, FH, FLCN, GALNT12, KIF1B, LZTR1, MAX, MEN1, MET, MLH1*, MSH2*, MSH3, MSH6*, MUTYH*, NBN, NF1*, NF2, NTHL1, PALB2*, PHOX2B, PMS2*, POT1, PRKAR1A, PTCH1, PTEN*, RAD51C*, RAD51D*,RB1, RECQL, RET, SDHA, SDHAF2, SDHB, SDHC, SDHD, SMAD4, SMARCA4, SMARCB1, SMARCE1, STK11, SUFU, TMEM127, TP53*,TSC1, TSC2, VHL and XRCC2 (sequencing and deletion/duplication); EGFR, EGLN1, HOXB13, KIT, MITF, PDGFRA, POLD1 and POLE (sequencing only); EPCAM and GREM1 (deletion/duplication only).   The test report has been scanned into EPIC and is located under the Molecular Pathology section of the Results Review tab.  A portion of the result report is included below for reference.     We discussed with [name] that because current genetic testing is not perfect, it is possible there may be a gene mutation in one of these genes that current testing cannot detect, but that chance is small.  We also discussed, that there could be another gene that has not yet been discovered, or that we have not yet tested, that is responsible for the cancer diagnoses in the family. It is also possible there is a hereditary cause for the cancer in the family that [name] did not inherit and therefore was not identified in her testing.  Therefore, it is important to  remain in touch with cancer genetics in the future so that we can continue to offer [name] the most up to date genetic testing.   ADDITIONAL GENETIC TESTING: We discussed with [name] that her genetic testing was fairly extensive.  If there are genes identified to increase cancer risk that can be analyzed in the future, we would be happy to discuss and coordinate this  testing at that time.    CANCER SCREENING RECOMMENDATIONS: [name] test result is considered negative (normal).  This means that we have not identified a hereditary cause for her  personal and family history of cancer at this time. Most cancers happen by chance and this negative test suggests that her cancer may fall into this category.    While reassuring, this does not definitively rule out a hereditary predisposition to cancer. It is still possible that there could be genetic mutations that are undetectable by current technology. There could be genetic mutations in genes that have not been tested or identified to increase cancer risk.  Therefore, it is recommended she continue to follow the cancer management and screening guidelines provided by her oncology and primary healthcare provider.   An individual's cancer risk and medical management are not determined by genetic test results alone. Overall cancer risk assessment incorporates additional factors, including personal medical history, family history, and any available genetic information that may result in a personalized plan for cancer prevention and surveillance.  RECOMMENDATIONS FOR FAMILY MEMBERS:  Relatives in this family might be at some increased risk of developing cancer, over the general population risk, simply due to the family history of cancer.  We recommended female relatives in this family have a yearly mammogram beginning at age 39, or 80 years younger than the earliest onset of cancer, an annual clinical breast exam, and perform monthly breast self-exams. Female relatives in this family should also have a gynecological exam as recommended by their primary provider.  All family members should be referred for colonoscopy starting at age 80.    It is also possible there is a hereditary cause for the cancer in [name]'s family that she did not inherit and therefore was not identified in her.  Based on [name]'s family history, we  recommended her maternal relatives have genetic counseling and testing. [name] will let us know if we can be of any assistance in coordinating genetic counseling and/or testing for these family members.  FOLLOW-UP: Lastly, we discussed with [name] that cancer genetics is a rapidly advancing field and it is possible that new genetic tests will be appropriate for her and/or her family members in the future. We encouraged her to remain in contact with cancer genetics on an annual basis so we can update her personal and family histories and let her know of advances in cancer genetics that may benefit this family.   Our contact number was provided. [name] questions were answered to her satisfaction, and she knows she is welcome to call us at anytime with additional questions or concerns.   Faith Rogue, MS, [facility] Genetic Counselor [email or web address] Phone: [phone]

## 2020-04-30 ENCOUNTER — Other Ambulatory Visit: Payer: Self-pay | Admitting: Oncology

## 2020-05-03 ENCOUNTER — Telehealth: Payer: Self-pay | Admitting: Oncology

## 2020-05-03 NOTE — Telephone Encounter (Signed)
Scheduled appt per 4/24 sch msg. Pt aware.

## 2020-05-23 ENCOUNTER — Ambulatory Visit
Admission: RE | Admit: 2020-05-23 | Discharge: 2020-05-23 | Disposition: A | Discharge status: Home / Self Care | Payer: Federal, State, Local not specified - PPO | Source: Ambulatory Visit | Attending: Adult Health | Admitting: Adult Health

## 2020-05-23 ENCOUNTER — Other Ambulatory Visit: Payer: Self-pay

## 2020-05-23 DIAGNOSIS — R922 Inconclusive mammogram: Secondary | ICD-10-CM | POA: Diagnosis not present

## 2020-05-23 DIAGNOSIS — Z171 Estrogen receptor negative status [ER-]: Secondary | ICD-10-CM

## 2020-05-23 DIAGNOSIS — C50212 Malignant neoplasm of upper-inner quadrant of left female breast: Secondary | ICD-10-CM

## 2020-06-12 ENCOUNTER — Other Ambulatory Visit: Payer: Federal, State, Local not specified - PPO

## 2020-06-12 ENCOUNTER — Ambulatory Visit: Payer: Federal, State, Local not specified - PPO | Admitting: Oncology

## 2020-06-23 DIAGNOSIS — Z01419 Encounter for gynecological examination (general) (routine) without abnormal findings: Secondary | ICD-10-CM | POA: Diagnosis not present

## 2020-07-12 ENCOUNTER — Other Ambulatory Visit: Payer: Self-pay

## 2020-07-12 ENCOUNTER — Inpatient Hospital Stay: Payer: Federal, State, Local not specified - PPO | Admitting: Oncology

## 2020-07-12 ENCOUNTER — Inpatient Hospital Stay: Payer: Federal, State, Local not specified - PPO | Attending: Oncology

## 2020-07-12 VITALS — BP 141/98 | HR 80 | Temp 97.6°F | Resp 18 | Ht 69.0 in | Wt 269.6 lb

## 2020-07-12 DIAGNOSIS — Z9221 Personal history of antineoplastic chemotherapy: Secondary | ICD-10-CM | POA: Diagnosis not present

## 2020-07-12 DIAGNOSIS — Z79899 Other long term (current) drug therapy: Secondary | ICD-10-CM | POA: Diagnosis not present

## 2020-07-12 DIAGNOSIS — Z171 Estrogen receptor negative status [ER-]: Secondary | ICD-10-CM | POA: Insufficient documentation

## 2020-07-12 DIAGNOSIS — C50212 Malignant neoplasm of upper-inner quadrant of left female breast: Secondary | ICD-10-CM | POA: Diagnosis not present

## 2020-07-12 DIAGNOSIS — Z923 Personal history of irradiation: Secondary | ICD-10-CM | POA: Diagnosis not present

## 2020-07-12 LAB — CBC WITH DIFFERENTIAL/PLATELET
Abs Immature Granulocytes: 0.01 10*3/uL (ref 0.00–0.07)
Basophils Absolute: 0 10*3/uL (ref 0.0–0.1)
Basophils Relative: 0 %
Eosinophils Absolute: 0.2 10*3/uL (ref 0.0–0.5)
Eosinophils Relative: 3 %
HCT: 40.1 % (ref 36.0–46.0)
Hemoglobin: 13.4 g/dL (ref 12.0–15.0)
Immature Granulocytes: 0 %
Lymphocytes Relative: 40 %
Lymphs Abs: 1.9 10*3/uL (ref 0.7–4.0)
MCH: 30.2 pg (ref 26.0–34.0)
MCHC: 33.4 g/dL (ref 30.0–36.0)
MCV: 90.5 fL (ref 80.0–100.0)
Monocytes Absolute: 0.4 10*3/uL (ref 0.1–1.0)
Monocytes Relative: 7 %
Neutro Abs: 2.4 10*3/uL (ref 1.7–7.7)
Neutrophils Relative %: 50 %
Platelets: 290 10*3/uL (ref 150–400)
RBC: 4.43 MIL/uL (ref 3.87–5.11)
RDW: 13.2 % (ref 11.5–15.5)
WBC: 4.9 10*3/uL (ref 4.0–10.5)
nRBC: 0 % (ref 0.0–0.2)

## 2020-07-12 LAB — COMPREHENSIVE METABOLIC PANEL
ALT: 25 U/L (ref 0–44)
AST: 17 U/L (ref 15–41)
Albumin: 4.1 g/dL (ref 3.5–5.0)
Alkaline Phosphatase: 110 U/L (ref 38–126)
Anion gap: 10 (ref 5–15)
BUN: 9 mg/dL (ref 6–20)
CO2: 24 mmol/L (ref 22–32)
Calcium: 9.5 mg/dL (ref 8.9–10.3)
Chloride: 108 mmol/L (ref 98–111)
Creatinine, Ser: 0.9 mg/dL (ref 0.44–1.00)
GFR, Estimated: >60 mL/min (ref >60)
Glucose, Bld: 115 mg/dL — ABNORMAL HIGH (ref 70–99)
Potassium: 3.7 mmol/L (ref 3.5–5.1)
Sodium: 142 mmol/L (ref 135–145)
Total Bilirubin: 0.3 mg/dL (ref 0.3–1.2)
Total Protein: 8.1 g/dL (ref 6.5–8.1)

## 2020-07-12 NOTE — Progress Notes (Signed)
[facility]  [phone] [phone]        ID: Cassandra Sanders DOB: 1965/06/27  MR#: [id number]  WGN#:[id number]   Patient Care Team: Lennie Odor, [place] as PCP - General (Nurse Practitioner) Jazsmine Macari, Virgie Dad, MD as Consulting Physician (Oncology) Rolm Bookbinder, MD as Consulting Physician (General Surgery) Kyung Rudd, MD as Consulting Physician (Radiation Oncology) Marylynn Pearson, MD as Consulting Physician (Obstetrics and Gynecology) OTHER MD:   CHIEF COMPLAINT: Triple negative breast cancer   CURRENT TREATMENT: Observation    INTERVAL HISTORY: Cassandra Sanders returns today for a follow-up of her triple negative breast cancer.  She is now under observation.  Since her last visit, she underwent genetic testing on 01/04/2020. Results were negative.  She also underwent bilateral diagnostic mammography with tomography at [facility] on 05/23/2020 showing: breast density category B; no evidence of malignancy in either breast.    REVIEW OF SYSTEMS: Athaliah continues to have significant peripheral neuropathy and this makes it hard for her to walk any distance.  She also has balance problems.  She requested a handicap parking permit and I was glad to provide that for her.  She does however walk as much as she can and she goes to [employer] regularly.  She has changed her diet so she is eating a lot of vegetables and her blood sugar is now much better controlled.  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  A detailed review of systems today was otherwise stable   COVID 19 VACCINATION STATUS:      HISTORY OF CURRENT ILLNESS: From the original intake note:   Darlynn Ricco had routine screening mammography on 05/15/2018 at [facility] showing a possible abnormality in the left breast. She underwent bilateral diagnostic mammography with  tomography and left breast ultrasonography at [facility] on 06/02/2018 showing: breast density category B; suspicious approximate 0.9 cm mass involving the upper inner quadrant of the left breast at posterior depth which account for the screening mammographic finding; no pathologic left axillary lymphadenopathy.   Accordingly on 06/04/2018 she proceeded to biopsy of the left breast area in question. The pathology from this procedure ([id number]) showed: invasive ductal carcinoma, grade 3. Prognostic indicators significant for: estrogen receptor, 60% positive with moderate staining intensity and progesterone receptor, 0% negative. Proliferation marker Ki67 at 80%. HER2 negative by immunohistochemistry (0).   The patient's subsequent history is as detailed below.   MEDICAL HISTORY: Past Medical History:  Diagnosis Date   Breast cancer ([place]) 06/04/2018   left breast   Diabetes mellitus without complication ([place])    Family history of breast cancer    Family history of prostate cancer    Hypertension    Personal history of chemotherapy 07/17/2018-10/09/2018   breast cancer   Personal history of radiation therapy 11/09/2018-12/25/2018   left breast    SURGICAL HISTORY: Past Surgical History:  Procedure Laterality Date   BREAST BIOPSY     BREAST LUMPECTOMY Left 06/26/2018   BREAST LUMPECTOMY WITH RADIOACTIVE SEED AND SENTINEL LYMPH NODE BIOPSY Left 06/26/2018   Procedure: LEFT BREAST LUMPECTOMY WITH RADIOACTIVE SEED AND LEFT AXILLARY SENTINEL LYMPH NODE BIOPSY;  Surgeon: Rolm Bookbinder, MD;  Location: [facility];  Service: General;  Laterality: Left;   PORTACATH PLACEMENT Right 06/26/2018   Procedure: INSERTION PORT-A-CATH WITH ULTRASOUND;  Surgeon: Rolm Bookbinder, MD;  Location:  [facility];  Service: General;  Laterality: Right;    FAMILY HISTORY: Family History  Adopted: Yes  Problem Relation Age of Onset   Prostate cancer Brother    Breast  cancer Cousin        dx in their 37s  Patient's father was in his 32s when he was murdered. Patient's mother is currently (as of 06/2018) at age 76. The patient denies a family hx of breast or ovarian cancer. She has 3 brothers and no biological sisters.     GYNECOLOGIC HISTORY:  Menarche: 55 years old Age at first live birth: 55 years old [place] P 1 LMP June 2015 Contraceptive used from age [age] with no complications HRT none  Hysterectomy? no BSO? 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  She is engaged to [facility]. She built her own home and is living there by herself and enjoying it.  Her son [name], age 60, currently lives in [place] working as a Museum/gallery exhibitions officer.  The patient attends [employer].              ADVANCED DIRECTIVES: not in place; at the 06/14/2019 visit this was discussed in she intends to name her son is her healthcare power of attorney.  She was given the appropriate documents to complete and notarized at her discretion   HEALTH MAINTENANCE:  Social History   Tobacco Use   Smoking status: Never   Smokeless tobacco: Never  Substance Use Topics   Alcohol use: No   Drug use: No    No Known Allergies   Current Outpatient Medications  Medication Sig Dispense Refill   aspirin EC 81 MG tablet Take 81 mg by mouth daily.     atorvastatin (LIPITOR) 10 MG tablet Take 10 mg by mouth daily.     JARDIANCE 25 MG TABS tablet Take 25 mg by mouth daily.     lisinopril (PRINIVIL,ZESTRIL) 10 MG tablet Take 10 mg by mouth daily.  2   METFORMIN HCL PO Take 1,000 mg by mouth daily.      No current facility-administered medications for this visit.    PHYSICAL EXAMINATION: African-American woman who appears stated age 55:   07/12/20 1457  BP: (!) 141/98  Pulse: 80  Resp: 18  Temp: 97.6 F (36.4 C)  SpO2: 100%  Body  mass index is 39.81 kg/m. Filed Weights   07/12/20 1457  Weight: 269 lb 9.6 oz (122.3 kg)    ECOG PERFORMANCE STATUS: 1 - Symptomatic but completely ambulatory  Sclerae unicteric, EOMs intact Wearing a mask No cervical or supraclavicular adenopathy Lungs no rales or rhonchi Heart regular rate and rhythm Abd soft, nontender, positive bowel sounds MSK no focal spinal tenderness, no upper extremity lymphedema Neuro: nonfocal, well oriented, appropriate affect Breasts: The right breast is benign.  The left breast has undergone lumpectomy and radiation.  There is no evidence of local recurrence.  Both axillae are benign.   LABORATORY DATA: Lab Results  Component Value Date   WBC 4.9 07/12/2020   HGB 13.4 07/12/2020   HCT 40.1 07/12/2020   MCV 90.5 07/12/2020   PLT 290 07/12/2020      Chemistry      Component Value Date/Time   NA 142 07/12/2020 1422   K 3.7 07/12/2020 1422   CL 108 07/12/2020 1422   CO2 24  07/12/2020 1422   BUN 9 07/12/2020 1422   CREATININE 0.90 07/12/2020 1422   CREATININE 1.14 (H) 06/10/2018 0859      Component Value Date/Time   CALCIUM 9.5 07/12/2020 1422   ALKPHOS 110 07/12/2020 1422   AST 17 07/12/2020 1422   AST 13 (L) 06/10/2018 0859   ALT 25 07/12/2020 1422   ALT 17 06/10/2018 0859   BILITOT 0.3 07/12/2020 1422   BILITOT 0.3 06/10/2018 0859      RADIOGRAPHIC STUDIES: No results found.   ELIGIBLE FOR AVAILABLE RESEARCH PROTOCOL: no   ASSESSMENT: 55 y.o. [place] woman status post left breast upper inner quadrant biopsy 06/04/2018 for a clinical T1b N0, stage IB invasive ductal carcinoma, grade 3, estrogen receptor moderately positive, progesterone receptor negative, with an MIB-1 of 80%, and HER-2 nonamplified   (1) left lumpectomy and sentinel lymph node sampling 06/26/2018 showed a pT1c pN0, stage IB invasive ductal carcinoma, grade 3, with negative margins             (a) repeat prognostic panel triple negative             (b) a  total of 3 sentinel lymph nodes were removed   (2) Oncotype score of 56 obtained from the 06/04/2018 biopsy predicts a risk of outside the breast recurrence over the next 9 years of nearly 40% if the patient's only systemic therapy is antiestrogens for 5 years.  It also predicts a significant benefit from chemotherapy.             (a) Oncotype reads the tumor also as triple negative.   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     PLAN: Teegan is now just over 2 years out from definitive surgery for her breast cancer with no evidence of disease recurrence.  This is particularly favorable as triple negative cases if they are going to recur tend to recur early.  I commended her diet and exercise program and her much better blood glucose control.  It is also very exciting that she will have her novel published and likely made into a movie.  She will have her next mammogram in May and see [place] again a year from now.  She knows to call for any other issue that may develop before then  Total encounter time 25 minutes.Sarajane Jews C. Bryar Rennie, MD 07/12/20 4:47 PM Medical Oncology and Hematology [facility] [address], [place] [address] Tel. [phone]    Fax. [phone]   I, Wilburn Mylar, am acting as scribe for Dr. Virgie Dad. Kizzy Olafson.  I, Lurline Del MD, have reviewed the above documentation for accuracy and completeness, and I agree with the above.   *Total Encounter Time as defined by the  Centers for Medicare and Medicaid Services includes, in addition to the face-to-face time of a patient visit (documented in the note above) non-face-to-face time: obtaining and reviewing outside history, ordering and reviewing medications, tests or procedures, care coordination (communications with other health care professionals or  caregivers) and documentation in the medical record.

## 2020-07-13 ENCOUNTER — Telehealth: Payer: Self-pay | Admitting: Oncology

## 2020-07-13 NOTE — Telephone Encounter (Signed)
Scheduled appointment per 07/06 los. Left message.

## 2020-07-14 DIAGNOSIS — Z0000 Encounter for general adult medical examination without abnormal findings: Secondary | ICD-10-CM | POA: Diagnosis not present

## 2020-07-14 DIAGNOSIS — G629 Polyneuropathy, unspecified: Secondary | ICD-10-CM | POA: Diagnosis not present

## 2020-07-14 DIAGNOSIS — E1169 Type 2 diabetes mellitus with other specified complication: Secondary | ICD-10-CM | POA: Diagnosis not present

## 2020-07-14 DIAGNOSIS — I10 Essential (primary) hypertension: Secondary | ICD-10-CM | POA: Diagnosis not present

## 2020-10-20 DIAGNOSIS — E1169 Type 2 diabetes mellitus with other specified complication: Secondary | ICD-10-CM | POA: Diagnosis not present

## 2020-11-08 IMAGING — MG DIGITAL DIAGNOSTIC BILAT W/ TOMO W/ CAD
6 of 9 series · 6 of 25 positions shown · non-contrast
Comparison: 06/26/2018 and earlier

CLINICAL DATA: LEFT lumpectomy with radiation treatment and
chemotherapy June 2018.

EXAM:
DIGITAL DIAGNOSTIC BILATERAL MAMMOGRAM WITH CAD AND TOMO

[L CC]
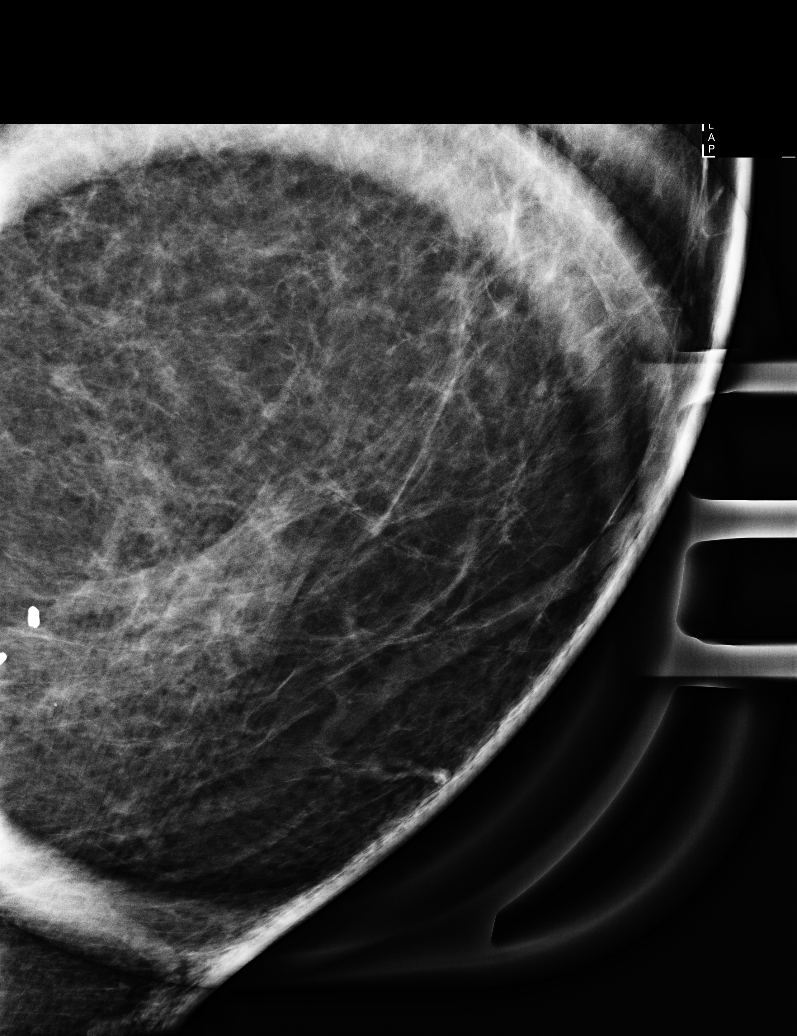

[R MLO synth-2D]
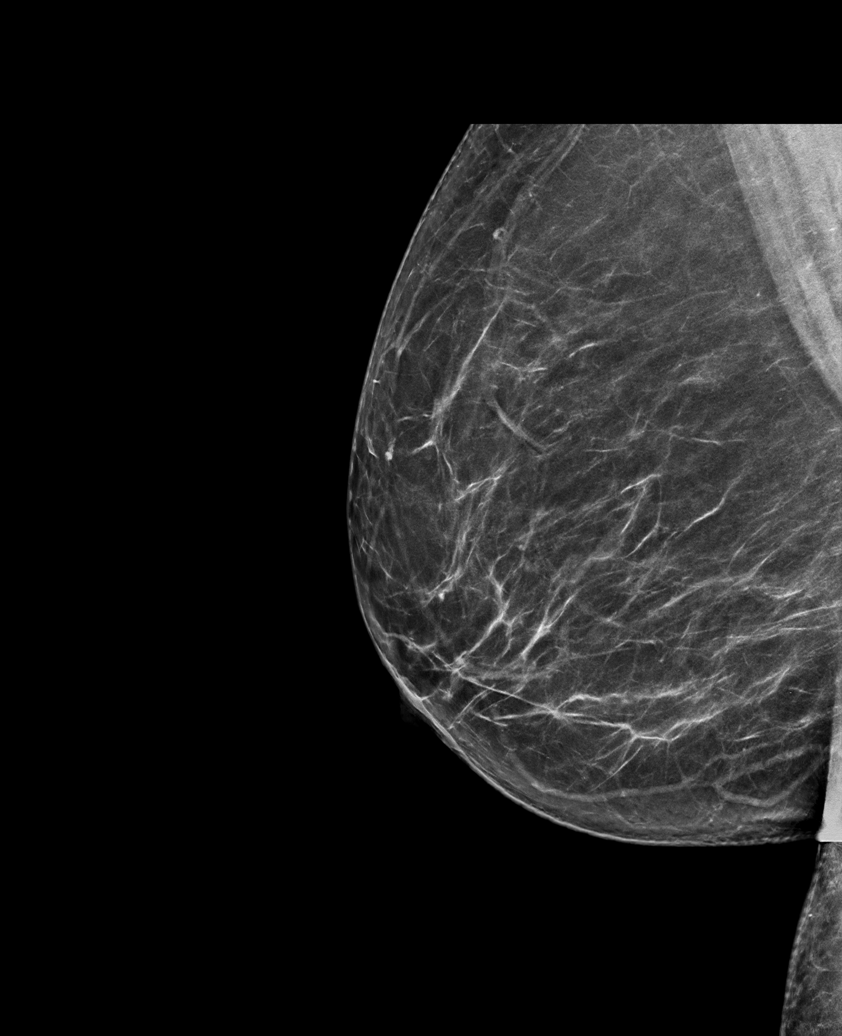

[L MLO synth-2D]
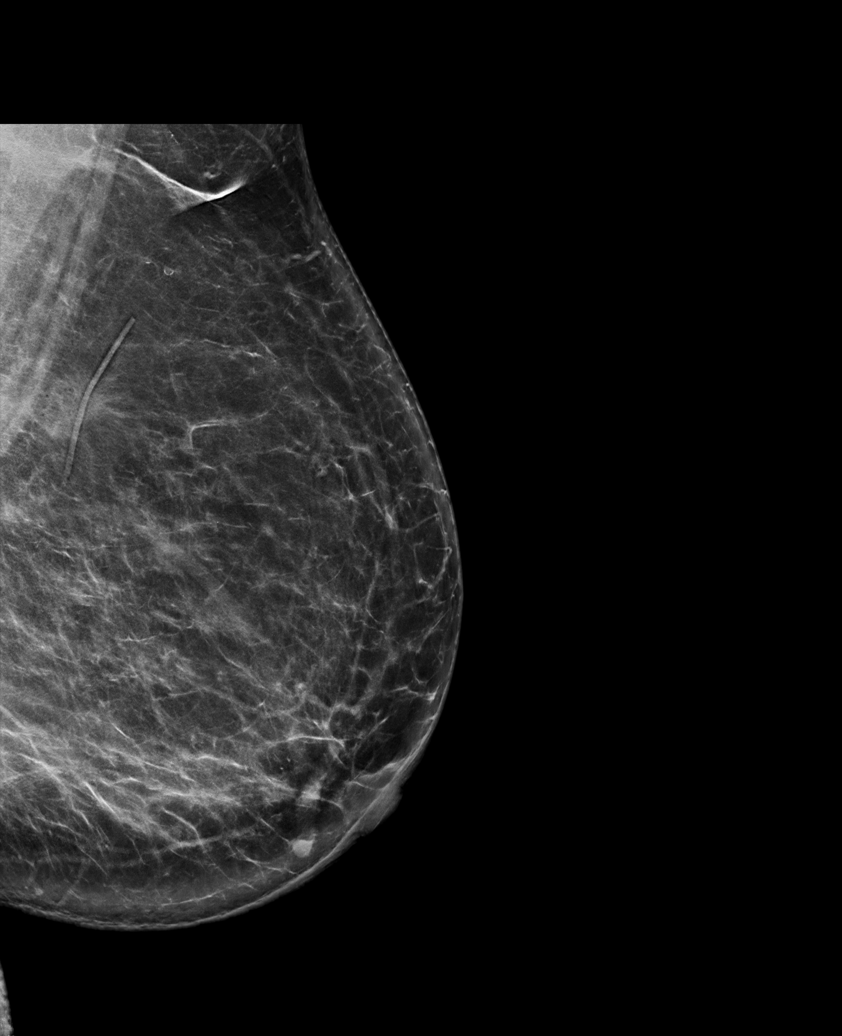

[R CC synth-2D]
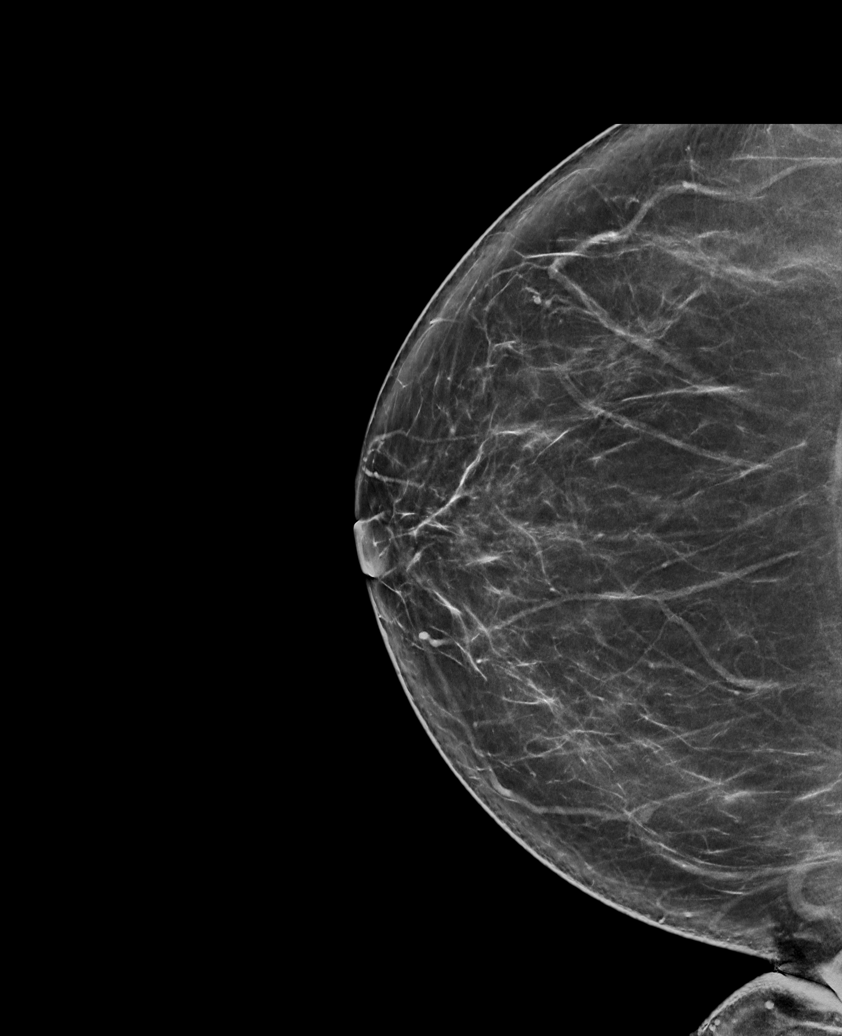

[L CC synth-2D]
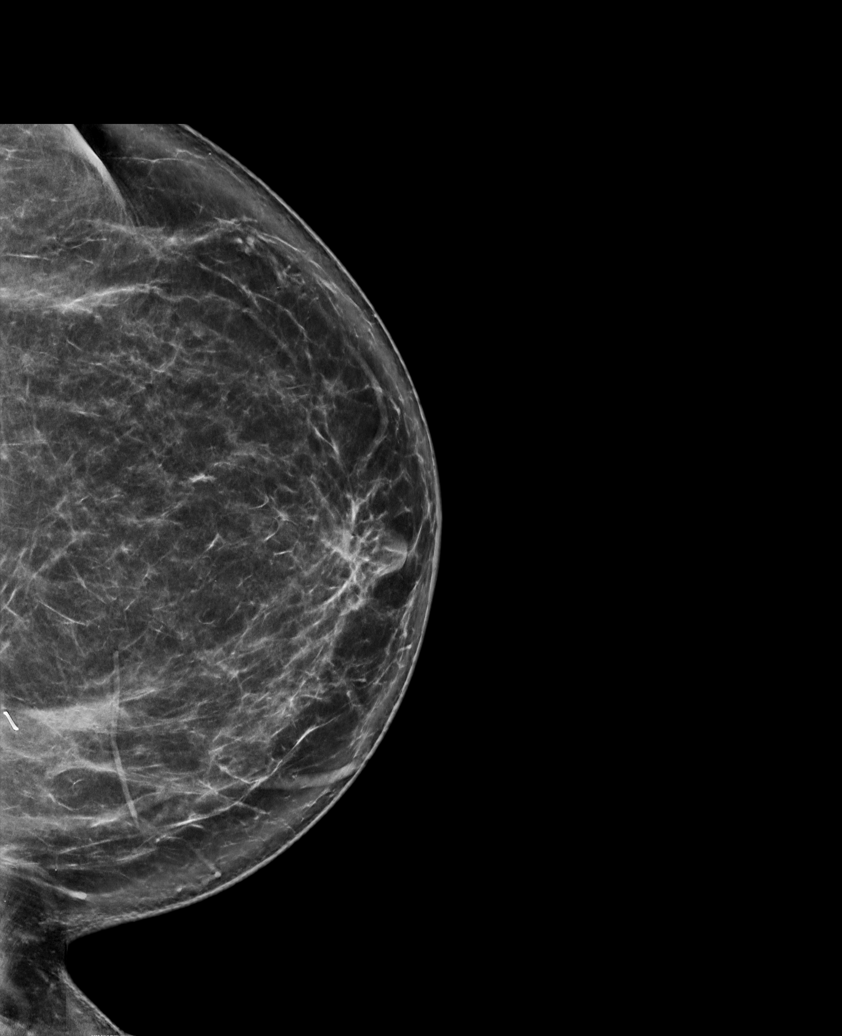

[R MLO tomo · tomo slice 43/85.0]
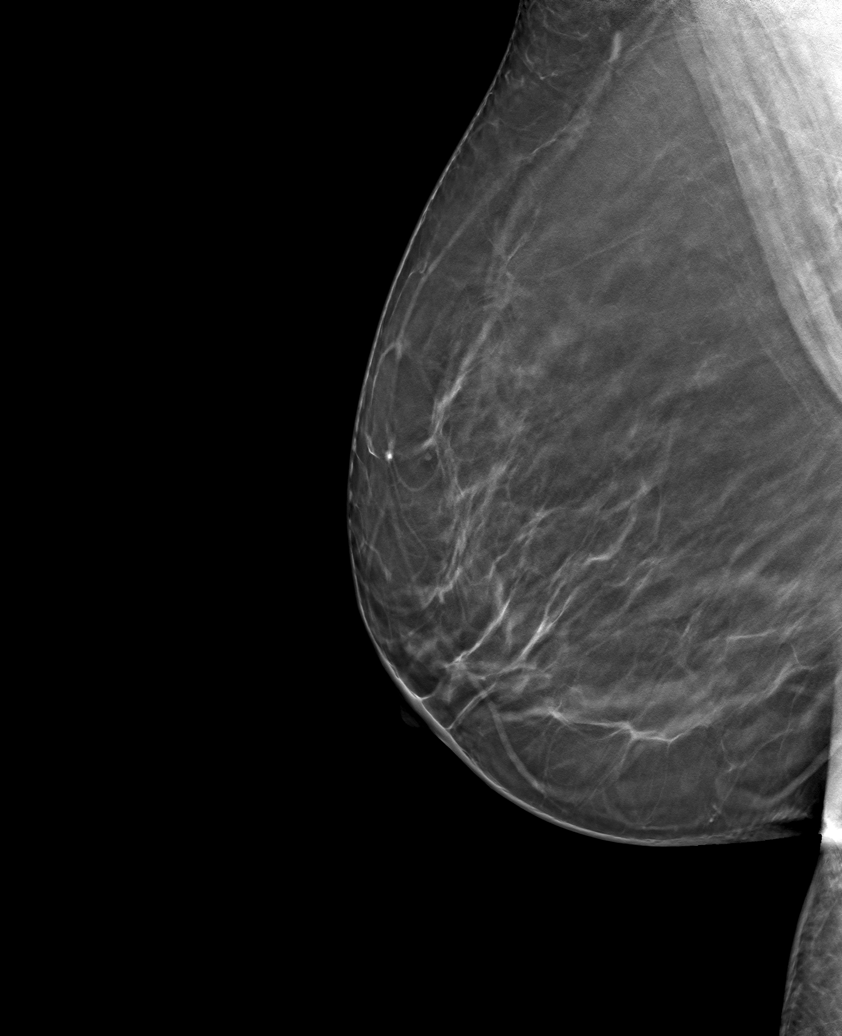

[6 of 25 positions shown; findings below may reference images not displayed]

ACR Breast Density Category b: There are scattered areas of
fibroglandular density.
FINDINGS: Post operative changes are seen in the LEFTbreast. No suspicious
mass, distortion, or microcalcifications are identified to suggest
presence of malignancy.

Mammographic images were processed with CAD.
IMPRESSION: No mammographic evidence for malignancy.

RECOMMENDATION:
Diagnostic mammogram is suggested in 1 year. (Code:[id number])

I have discussed the findings and recommendations with the patient.
If applicable, a reminder letter will be sent to the patient
regarding the next appointment.

BI-RADS CATEGORY  2: Benign.

## 2021-02-26 DIAGNOSIS — I10 Essential (primary) hypertension: Secondary | ICD-10-CM | POA: Diagnosis not present

## 2021-02-26 DIAGNOSIS — H3500 Unspecified background retinopathy: Secondary | ICD-10-CM | POA: Diagnosis not present

## 2021-02-26 DIAGNOSIS — E1169 Type 2 diabetes mellitus with other specified complication: Secondary | ICD-10-CM | POA: Diagnosis not present

## 2021-04-23 ENCOUNTER — Other Ambulatory Visit: Payer: Self-pay | Admitting: Hematology and Oncology

## 2021-04-23 DIAGNOSIS — Z853 Personal history of malignant neoplasm of breast: Secondary | ICD-10-CM

## 2021-04-23 DIAGNOSIS — Z1231 Encounter for screening mammogram for malignant neoplasm of breast: Secondary | ICD-10-CM

## 2021-05-24 ENCOUNTER — Ambulatory Visit
Admission: RE | Admit: 2021-05-24 | Discharge: 2021-05-24 | Disposition: A | Discharge status: Home / Self Care | Payer: Federal, State, Local not specified - PPO | Source: Ambulatory Visit | Attending: Hematology and Oncology | Admitting: Hematology and Oncology

## 2021-05-24 ENCOUNTER — Other Ambulatory Visit: Payer: Self-pay | Admitting: Hematology and Oncology

## 2021-05-24 DIAGNOSIS — Z1231 Encounter for screening mammogram for malignant neoplasm of breast: Secondary | ICD-10-CM

## 2021-05-24 DIAGNOSIS — Z853 Personal history of malignant neoplasm of breast: Secondary | ICD-10-CM

## 2021-05-30 DIAGNOSIS — E1169 Type 2 diabetes mellitus with other specified complication: Secondary | ICD-10-CM | POA: Diagnosis not present

## 2021-06-29 DIAGNOSIS — Z1151 Encounter for screening for human papillomavirus (HPV): Secondary | ICD-10-CM | POA: Diagnosis not present

## 2021-06-29 DIAGNOSIS — Z113 Encounter for screening for infections with a predominantly sexual mode of transmission: Secondary | ICD-10-CM | POA: Diagnosis not present

## 2021-06-29 DIAGNOSIS — Z124 Encounter for screening for malignant neoplasm of cervix: Secondary | ICD-10-CM | POA: Diagnosis not present

## 2021-06-29 DIAGNOSIS — Z01419 Encounter for gynecological examination (general) (routine) without abnormal findings: Secondary | ICD-10-CM | POA: Diagnosis not present

## 2021-07-13 ENCOUNTER — Ambulatory Visit: Payer: Federal, State, Local not specified - PPO | Admitting: Hematology and Oncology

## 2021-07-13 ENCOUNTER — Other Ambulatory Visit: Payer: Federal, State, Local not specified - PPO

## 2021-07-17 ENCOUNTER — Encounter: Payer: Self-pay | Admitting: Hematology and Oncology

## 2021-07-17 ENCOUNTER — Other Ambulatory Visit: Payer: Self-pay

## 2021-07-17 ENCOUNTER — Other Ambulatory Visit: Payer: Self-pay | Admitting: *Deleted

## 2021-07-17 ENCOUNTER — Inpatient Hospital Stay: Payer: Federal, State, Local not specified - PPO | Attending: Hematology and Oncology

## 2021-07-17 ENCOUNTER — Inpatient Hospital Stay (HOSPITAL_BASED_OUTPATIENT_CLINIC_OR_DEPARTMENT_OTHER): Payer: Federal, State, Local not specified - PPO | Admitting: Hematology and Oncology

## 2021-07-17 VITALS — BP 148/80 | HR 92 | Temp 98.1°F | Resp 16 | Ht 69.0 in | Wt 272.4 lb

## 2021-07-17 DIAGNOSIS — C50212 Malignant neoplasm of upper-inner quadrant of left female breast: Secondary | ICD-10-CM | POA: Insufficient documentation

## 2021-07-17 DIAGNOSIS — Z9221 Personal history of antineoplastic chemotherapy: Secondary | ICD-10-CM | POA: Diagnosis not present

## 2021-07-17 DIAGNOSIS — Z803 Family history of malignant neoplasm of breast: Secondary | ICD-10-CM | POA: Diagnosis not present

## 2021-07-17 DIAGNOSIS — Z171 Estrogen receptor negative status [ER-]: Secondary | ICD-10-CM | POA: Insufficient documentation

## 2021-07-17 DIAGNOSIS — Z923 Personal history of irradiation: Secondary | ICD-10-CM | POA: Diagnosis not present

## 2021-07-17 DIAGNOSIS — Z79899 Other long term (current) drug therapy: Secondary | ICD-10-CM | POA: Insufficient documentation

## 2021-07-17 DIAGNOSIS — R87612 Low grade squamous intraepithelial lesion on cytologic smear of cervix (LGSIL): Secondary | ICD-10-CM | POA: Diagnosis not present

## 2021-07-17 DIAGNOSIS — Z8042 Family history of malignant neoplasm of prostate: Secondary | ICD-10-CM | POA: Insufficient documentation

## 2021-07-17 DIAGNOSIS — N72 Inflammatory disease of cervix uteri: Secondary | ICD-10-CM | POA: Diagnosis not present

## 2021-07-17 LAB — CBC WITH DIFFERENTIAL (CANCER CENTER ONLY)
Abs Immature Granulocytes: 0.01 10*3/uL (ref 0.00–0.07)
Basophils Absolute: 0 10*3/uL (ref 0.0–0.1)
Basophils Relative: 0 %
Eosinophils Absolute: 0.2 10*3/uL (ref 0.0–0.5)
Eosinophils Relative: 4 %
HCT: 39.9 % (ref 36.0–46.0)
Hemoglobin: 13.5 g/dL (ref 12.0–15.0)
Immature Granulocytes: 0 %
Lymphocytes Relative: 43 %
Lymphs Abs: 2.4 10*3/uL (ref 0.7–4.0)
MCH: 30.7 pg (ref 26.0–34.0)
MCHC: 33.8 g/dL (ref 30.0–36.0)
MCV: 90.7 fL (ref 80.0–100.0)
Monocytes Absolute: 0.4 10*3/uL (ref 0.1–1.0)
Monocytes Relative: 8 %
Neutro Abs: 2.5 10*3/uL (ref 1.7–7.7)
Neutrophils Relative %: 45 %
Platelet Count: 259 10*3/uL (ref 150–400)
RBC: 4.4 MIL/uL (ref 3.87–5.11)
RDW: 13.2 % (ref 11.5–15.5)
WBC Count: 5.5 10*3/uL (ref 4.0–10.5)
nRBC: 0 % (ref 0.0–0.2)

## 2021-07-17 LAB — CMP (CANCER CENTER ONLY)
ALT: 26 U/L (ref 0–44)
AST: 16 U/L (ref 15–41)
Albumin: 4.2 g/dL (ref 3.5–5.0)
Alkaline Phosphatase: 106 U/L (ref 38–126)
Anion gap: 6 (ref 5–15)
BUN: 11 mg/dL (ref 6–20)
CO2: 29 mmol/L (ref 22–32)
Calcium: 9.7 mg/dL (ref 8.9–10.3)
Chloride: 106 mmol/L (ref 98–111)
Creatinine: 1.01 mg/dL — ABNORMAL HIGH (ref 0.44–1.00)
GFR, Estimated: >60 mL/min (ref >60)
Glucose, Bld: 182 mg/dL — ABNORMAL HIGH (ref 70–99)
Potassium: 3.8 mmol/L (ref 3.5–5.1)
Sodium: 141 mmol/L (ref 135–145)
Total Bilirubin: 0.2 mg/dL — ABNORMAL LOW (ref 0.3–1.2)
Total Protein: 7.6 g/dL (ref 6.5–8.1)

## 2021-07-17 NOTE — Progress Notes (Signed)
[facility]  Telephone:([phone] [phone]        ID: Cassandra Sanders DOB: 01-22-1965  MR#: [id number]  YCX#:[id number]   Patient Care Team: Lennie Odor, [place] as PCP - General (Nurse Practitioner) Magrinat, Virgie Dad, MD (Inactive) as Consulting Physician (Oncology) Rolm Bookbinder, MD as Consulting Physician (General Surgery) Kyung Rudd, MD as Consulting Physician (Radiation Oncology) Marylynn Pearson, MD as Consulting Physician (Obstetrics and Gynecology) OTHER MD:   CHIEF COMPLAINT: Triple negative breast cancer   CURRENT TREATMENT: Observation    INTERVAL HISTORY:  [name] returns today for a follow-up of her triple negative breast cancer.  She is now under observation. She denies any complaints. She feels well, no complaints.  No change in breathing or bowel habits or urinary habits. Rest of the pertinent 10 point ROS reviewed and negative    COVID 19 VACCINATION STATUS:      HISTORY OF CURRENT ILLNESS: From the original intake note:   [name] had routine screening mammography on 05/15/2018 at [facility] showing a possible abnormality in the left breast. She underwent bilateral diagnostic mammography with tomography and left breast ultrasonography at [facility] on 06/02/2018 showing: breast density category B; suspicious approximate 0.9 cm mass involving the upper inner quadrant of the left breast at posterior depth which account for the screening mammographic finding; no pathologic left axillary lymphadenopathy.   Accordingly on 06/04/2018 she proceeded to biopsy of the left breast area in question. The pathology from this procedure ([id number]) showed: invasive ductal carcinoma, grade 3. Prognostic indicators significant for: estrogen receptor, 60% positive with moderate staining intensity and progesterone receptor, 0% negative. Proliferation marker Ki67 at 80%. HER2 negative by immunohistochemistry (0).   The  patient's subsequent history is as detailed below.   MEDICAL HISTORY: Past Medical History:  Diagnosis Date   Breast cancer ([place]) 06/04/2018   left breast   Diabetes mellitus without complication ([place])    Family history of breast cancer    Family history of prostate cancer    Hypertension    Personal history of chemotherapy 07/17/2018-10/09/2018   breast cancer   Personal history of radiation therapy 11/09/2018-12/25/2018   left breast    SURGICAL HISTORY: Past Surgical History:  Procedure Laterality Date   BREAST BIOPSY     BREAST LUMPECTOMY Left 06/26/2018   BREAST LUMPECTOMY WITH RADIOACTIVE SEED AND SENTINEL LYMPH NODE BIOPSY Left 06/26/2018   Procedure: LEFT BREAST LUMPECTOMY WITH RADIOACTIVE SEED AND LEFT AXILLARY SENTINEL LYMPH NODE BIOPSY;  Surgeon: Rolm Bookbinder, MD;  Location: [facility];  Service: General;  Laterality: Left;   PORTACATH PLACEMENT Right 06/26/2018   Procedure: INSERTION PORT-A-CATH WITH ULTRASOUND;  Surgeon: Rolm Bookbinder, MD;  Location: [facility];  Service: General;  Laterality: Right;    FAMILY HISTORY: Family History  Adopted: Yes  Problem Relation Age of Onset   Prostate cancer Brother    Breast cancer Cousin        dx in their 32s  Patient's father was in his 25s when he was murdered. Patient's mother is currently (as of 06/2018) at age 74. The patient denies a family hx of breast or ovarian cancer. She has 3 brothers and no biological sisters.     GYNECOLOGIC HISTORY:  Menarche: 56 years old Age at first live birth: 56 years old [place] P 1 LMP June 2015 Contraceptive used from age [age] with no complications HRT none  Hysterectomy? no BSO? 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  She is engaged to [facility]. She built her own home and is living there by herself and enjoying it.  Her  son [name], age 67, currently lives in [place] working as a Museum/gallery exhibitions officer.  The patient attends [employer].              ADVANCED DIRECTIVES: not in place; at the 57/07/2019 visit this was discussed in she intends to name her son is her healthcare power of attorney.  She was given the appropriate documents to complete and notarized at her discretion   HEALTH MAINTENANCE:  Social History   Tobacco Use   Smoking status: Never   Smokeless tobacco: Never  Substance Use Topics   Alcohol use: No   Drug use: No    No Known Allergies   Current Outpatient Medications  Medication Sig Dispense Refill   aspirin EC 81 MG tablet Take 81 mg by mouth daily.     atorvastatin (LIPITOR) 10 MG tablet Take 10 mg by mouth daily.     JARDIANCE 25 MG TABS tablet Take 25 mg by mouth daily.     lisinopril (PRINIVIL,ZESTRIL) 10 MG tablet Take 10 mg by mouth daily.  2   METFORMIN HCL PO Take 1,000 mg by mouth daily.      No current facility-administered medications for this visit.    PHYSICAL EXAMINATION: African-American woman who appears stated age 56:   07/17/21 1604  BP: (!) 148/80  Pulse: 92  Resp: 16  Temp: 98.1 F (36.7 C)  SpO2: 99%  Body mass index is 40.23 kg/m. Filed Weights   07/17/21 1604  Weight: 272 lb 6.4 oz (123.6 kg)    ECOG PERFORMANCE STATUS: 1 - Symptomatic but completely ambulatory  Physical Exam Constitutional:      Appearance: Normal appearance.  Cardiovascular:     Rate and Rhythm: Normal rate and regular rhythm.  Chest:     Comments: Bilateral breast examined.  No palpable masses or regional adenopathy. Abdominal:     General: Abdomen is flat. Bowel sounds are normal. There is no distension.     Tenderness: There is no abdominal tenderness.  Musculoskeletal:     Cervical back: Normal range of motion and neck supple. No rigidity.  Lymphadenopathy:     Cervical: No cervical adenopathy.  Skin:    General: Skin is warm and  dry.  Neurological:     General: No focal deficit present.     Mental Status: She is alert.       LABORATORY DATA: Lab Results  Component Value Date   WBC 5.5 07/17/2021   HGB 13.5 07/17/2021   HCT 39.9 07/17/2021   MCV 90.7 07/17/2021   PLT 259 07/17/2021      Chemistry      Component Value Date/Time   NA 142 07/12/2020 1422   K 3.7 07/12/2020 1422   CL 108 07/12/2020 1422   CO2 24 07/12/2020 1422   BUN 9 07/12/2020 1422   CREATININE 0.90 07/12/2020 1422   CREATININE 1.14 (H) 06/10/2018 0859      Component Value Date/Time   CALCIUM 9.5 07/12/2020 1422   ALKPHOS 110 07/12/2020 1422   AST 17 07/12/2020 1422   AST 13 (L) 06/10/2018 0859   ALT 25 07/12/2020 1422   ALT 17 06/10/2018 0859   BILITOT 0.3 07/12/2020 1422  BILITOT 0.3 06/10/2018 0859      RADIOGRAPHIC STUDIES: No results found.   ELIGIBLE FOR AVAILABLE RESEARCH PROTOCOL: no   ASSESSMENT: 56 y.o.  woman status post left breast upper inner quadrant biopsy 06/04/2018 for a clinical T1b N0, stage IB invasive ductal carcinoma, grade 3, estrogen receptor moderately positive, progesterone receptor negative, with an MIB-1 of 80%, and HER-2 nonamplified   (1) left lumpectomy and sentinel lymph node sampling 06/26/2018 showed a pT1c pN0, stage IB invasive ductal carcinoma, grade 3, with negative margins             (a) repeat prognostic panel triple negative             (b) a total of 3 sentinel lymph nodes were removed   (2) Oncotype score of 56 obtained from the 06/04/2018 biopsy predicts a risk of outside the breast recurrence over the next 9 years of nearly 40% if the patient's only systemic therapy is antiestrogens for 5 years.  It also predicts a significant benefit from chemotherapy.             (a) Oncotype reads the tumor also as triple negative.   (3) adjuvant chemotherapy consisting of cyclophosphamide and doxorubicin in dose dense fashion x4 started 07/17/2018, completed 09/03/2018,  followed by weekly paclitaxel and carboplatin x12 starting on 09/18/2018, last dose 10/09/2018             (a) echo on 07/16/2018 shows EF of 60-65%  (b) carboplatinum/paclitaxel discontinued after 4 of 12 planned doses due to neuropathy   (4) adjuvant radiation therapy 11/09/2018-12/24/2018 to the left breast:  (a) 50.4 Gy in 28 fractions followed by a 10 Gy in 5 fractions.     PLAN:  Ashla is now just over 3 years out from definitive surgery for her breast cancer with no evidence of disease recurrence.   This is particularly favorable as triple negative cases if they are going to recur tend to recur early. Recent mammogram in May without any evidence of malignancy.  Physical examination today without any concerns. She will return to clinic for follow-up in 1 year or sooner as needed. Total encounter time 20 minutes.*  *Total Encounter Time as defined by the Centers for Medicare and Medicaid Services includes, in addition to the face-to-face time of a patient visit (documented in the note above) non-face-to-face time: obtaining and reviewing outside history, ordering and reviewing medications, tests or procedures, care coordination (communications with other health care professionals or caregivers) and documentation in the medical record.

## 2021-08-24 DIAGNOSIS — Z0000 Encounter for general adult medical examination without abnormal findings: Secondary | ICD-10-CM | POA: Diagnosis not present

## 2021-08-24 DIAGNOSIS — G629 Polyneuropathy, unspecified: Secondary | ICD-10-CM | POA: Diagnosis not present

## 2021-08-24 DIAGNOSIS — E1169 Type 2 diabetes mellitus with other specified complication: Secondary | ICD-10-CM | POA: Diagnosis not present

## 2021-08-24 DIAGNOSIS — I10 Essential (primary) hypertension: Secondary | ICD-10-CM | POA: Diagnosis not present

## 2021-11-23 DIAGNOSIS — E1169 Type 2 diabetes mellitus with other specified complication: Secondary | ICD-10-CM | POA: Diagnosis not present

## 2022-05-15 ENCOUNTER — Other Ambulatory Visit: Payer: Self-pay | Admitting: Hematology and Oncology

## 2022-07-05 ENCOUNTER — Other Ambulatory Visit: Payer: Self-pay | Admitting: Hematology and Oncology

## 2022-07-05 DIAGNOSIS — Z1231 Encounter for screening mammogram for malignant neoplasm of breast: Secondary | ICD-10-CM

## 2022-07-10 ENCOUNTER — Ambulatory Visit
Admission: RE | Admit: 2022-07-10 | Discharge: 2022-07-10 | Disposition: A | Discharge status: Home / Self Care | Payer: Federal, State, Local not specified - PPO | Source: Ambulatory Visit | Attending: Hematology and Oncology | Admitting: Hematology and Oncology

## 2022-07-10 DIAGNOSIS — Z1231 Encounter for screening mammogram for malignant neoplasm of breast: Secondary | ICD-10-CM

## 2022-07-18 ENCOUNTER — Inpatient Hospital Stay
Payer: Federal, State, Local not specified - PPO | Attending: Hematology and Oncology | Admitting: Hematology and Oncology

## 2022-07-18 NOTE — Progress Notes (Deleted)
[facility]  [phone] Fax:(336) [phone]        ID: Cassandra Sanders DOB: 09/03/1965  MR#: [id number]  QMV#:[id number]   Patient Care Team: Milus Height, PA as PCP - General (Nurse Practitioner) Magrinat, Valentino Hue, MD (Inactive) as Consulting Physician (Oncology) Emelia Loron, MD as Consulting Physician (General Surgery) Dorothy Puffer, MD as Consulting Physician (Radiation Oncology) Zelphia Cairo, MD as Consulting Physician (Obstetrics and Gynecology) OTHER MD:   CHIEF COMPLAINT: Triple negative breast cancer   CURRENT TREATMENT: Observation    INTERVAL HISTORY:  Cassandra Sanders returns today for a follow-up of her triple negative breast cancer.  She is now under observation. She denies any complaints. She feels well, no complaints.  No change in breathing or bowel habits or urinary habits. Rest of the pertinent 10 point ROS reviewed and negative    COVID 19 VACCINATION STATUS:      HISTORY OF CURRENT ILLNESS: From the original intake note:   Cassandra Sanders had routine screening mammography on 05/15/2018 at [facility] showing a possible abnormality in the left breast. She underwent bilateral diagnostic mammography with tomography and left breast ultrasonography at [facility] on 06/02/2018 showing: breast density category B; suspicious approximate 0.9 cm mass involving the upper inner quadrant of the left breast at posterior depth which account for the screening mammographic finding; no pathologic left axillary lymphadenopathy.   Accordingly on 06/04/2018 she proceeded to biopsy of the left breast area in question. The pathology from this procedure ([id number]) showed: invasive ductal carcinoma, grade 3. Prognostic indicators significant for: estrogen receptor, 60% positive with moderate staining intensity and progesterone receptor, 0% negative. Proliferation marker Ki67 at 80%. HER2 negative by immunohistochemistry (0).   The  patient's subsequent history is as detailed below.   MEDICAL HISTORY: Past Medical History:  Diagnosis Date   Breast cancer (HCC) 06/04/2018   left breast   Diabetes mellitus without complication (HCC)    Family history of breast cancer    Family history of prostate cancer    Hypertension    Personal history of chemotherapy 07/17/2018-10/09/2018   breast cancer   Personal history of radiation therapy 11/09/2018-12/25/2018   left breast    SURGICAL HISTORY: Past Surgical History:  Procedure Laterality Date   BREAST BIOPSY     BREAST LUMPECTOMY Left 06/26/2018   BREAST LUMPECTOMY WITH RADIOACTIVE SEED AND SENTINEL LYMPH NODE BIOPSY Left 06/26/2018   Procedure: LEFT BREAST LUMPECTOMY WITH RADIOACTIVE SEED AND LEFT AXILLARY SENTINEL LYMPH NODE BIOPSY;  Surgeon: Emelia Loron, MD;  Location: [facility];  Service: General;  Laterality: Left;   PORTACATH PLACEMENT Right 06/26/2018   Procedure: INSERTION PORT-A-CATH WITH ULTRASOUND;  Surgeon: Emelia Loron, MD;  Location: [facility];  Service: General;  Laterality: Right;    FAMILY HISTORY: Family History  Adopted: Yes  Problem Relation Age of Onset   Prostate cancer Brother    Breast cancer Cousin        dx in their 69s  Patient's father was in his 30s when he was murdered. Patient's mother is currently (as of 06/2018) at age 58. The patient denies a family hx of breast or ovarian cancer. She has 3 brothers and no biological sisters.     GYNECOLOGIC HISTORY:  Menarche: 57 years old Age at first live birth: 57 years old GX P 1 LMP June 2015 Contraceptive used from age [age] with no complications HRT none  Hysterectomy? no BSO? 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  She is engaged to [facility]. She built her own home and is living there by herself and enjoying it.  Her  son Georgianne Fick, age 16, currently lives in [place] working as a Immunologist.  The patient attends [employer].              ADVANCED DIRECTIVES: not in place; at the 06/14/2019 visit this was discussed in she intends to name her son is her healthcare power of attorney.  She was given the appropriate documents to complete and notarized at her discretion   HEALTH MAINTENANCE:  Social History   Tobacco Use   Smoking status: Never   Smokeless tobacco: Never  Substance Use Topics   Alcohol use: No   Drug use: No    No Known Allergies   Current Outpatient Medications  Medication Sig Dispense Refill   aspirin EC 81 MG tablet Take 81 mg by mouth daily.     atorvastatin (LIPITOR) 10 MG tablet Take 10 mg by mouth daily.     JARDIANCE 25 MG TABS tablet Take 25 mg by mouth daily.     lisinopril (PRINIVIL,ZESTRIL) 10 MG tablet Take 10 mg by mouth daily.  2   METFORMIN HCL PO Take 1,000 mg by mouth daily.      No current facility-administered medications for this visit.    PHYSICAL EXAMINATION: African-American woman who appears stated age There were no vitals filed for this visit. There is no height or weight on file to calculate BMI. There were no vitals filed for this visit.   ECOG PERFORMANCE STATUS: 1 - Symptomatic but completely ambulatory  Physical Exam Constitutional:      Appearance: Normal appearance.  Cardiovascular:     Rate and Rhythm: Normal rate and regular rhythm.  Chest:     Comments: Bilateral breast examined.  No palpable masses or regional adenopathy. Abdominal:     General: Abdomen is flat. Bowel sounds are normal. There is no distension.     Tenderness: There is no abdominal tenderness.  Musculoskeletal:     Cervical back: Normal range of motion and neck supple. No rigidity.  Lymphadenopathy:     Cervical: No cervical adenopathy.  Skin:    General: Skin is warm and dry.  Neurological:     General: No focal deficit present.      Mental Status: She is alert.       LABORATORY DATA: Lab Results  Component Value Date   WBC 5.5 07/17/2021   HGB 13.5 07/17/2021   HCT 39.9 07/17/2021   MCV 90.7 07/17/2021   PLT 259 07/17/2021      Chemistry      Component Value Date/Time   NA 141 07/17/2021 1538   K 3.8 07/17/2021 1538   CL 106 07/17/2021 1538   CO2 29 07/17/2021 1538   BUN 11 07/17/2021 1538   CREATININE 1.01 (H) 07/17/2021 1538      Component Value Date/Time   CALCIUM 9.7 07/17/2021 1538   ALKPHOS 106 07/17/2021 1538   AST 16 07/17/2021 1538   ALT 26 07/17/2021 1538   BILITOT 0.2 (L) 07/17/2021 1538      RADIOGRAPHIC STUDIES: MM 3D SCREENING MAMMOGRAM BILATERAL BREAST  Result Date: 07/12/2022 CLINICAL DATA:  Screening. EXAM: DIGITAL SCREENING BILATERAL MAMMOGRAM WITH TOMOSYNTHESIS AND CAD TECHNIQUE: Bilateral screening digital craniocaudal and mediolateral oblique  mammograms were obtained. Bilateral screening digital breast tomosynthesis was performed. The images were evaluated with computer-aided detection. COMPARISON:  Previous exam(s). ACR Breast Density Category b: There are scattered areas of fibroglandular density. FINDINGS: There are no findings suspicious for malignancy. IMPRESSION: No mammographic evidence of malignancy. A result letter of this screening mammogram will be mailed directly to the patient. RECOMMENDATION: Screening mammogram in one year. (Code:SM-B-01Y) BI-RADS CATEGORY  1: Negative. Electronically Signed   By: Annia Belt M.D.   On: 07/12/2022 07:42     ELIGIBLE FOR AVAILABLE RESEARCH PROTOCOL: no   ASSESSMENT: 57 y.o. [place] woman status post left breast upper inner quadrant biopsy 06/04/2018 for a clinical T1b N0, stage IB invasive ductal carcinoma, grade 3, estrogen receptor moderately positive, progesterone receptor negative, with an MIB-1 of 80%, and HER-2 nonamplified   (1) left lumpectomy and sentinel lymph node sampling 06/26/2018 showed a pT1c pN0, stage IB  invasive ductal carcinoma, grade 3, with negative margins             (a) repeat prognostic panel triple negative             (b) a total of 3 sentinel lymph nodes were removed   (2) Oncotype score of 56 obtained from the 06/04/2018 biopsy predicts a risk of outside the breast recurrence over the next 9 years of nearly 40% if the patient's only systemic therapy is antiestrogens for 5 years.  It also predicts a significant benefit from chemotherapy.             (a) Oncotype reads the tumor also as triple negative.   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     PLAN:  Leahann is now just over 3 years out from definitive surgery for her breast cancer with no evidence of disease recurrence.   This is particularly favorable as triple negative cases if they are going to recur tend to recur early. Recent mammogram in May without any evidence of malignancy.  Physical examination today without any concerns. She will return to clinic for follow-up in 1 year or sooner as needed. Total encounter time 20 minutes.*  *Total Encounter Time as defined by the Centers for Medicare and Medicaid Services includes, in addition to the face-to-face time of a patient visit (documented in the note above) non-face-to-face time: obtaining and reviewing outside history, ordering and reviewing medications, tests or procedures, care coordination (communications with other health care professionals or caregivers) and documentation in the medical record.

## 2022-08-02 DIAGNOSIS — Z01419 Encounter for gynecological examination (general) (routine) without abnormal findings: Secondary | ICD-10-CM | POA: Diagnosis not present

## 2022-08-02 DIAGNOSIS — Z124 Encounter for screening for malignant neoplasm of cervix: Secondary | ICD-10-CM | POA: Diagnosis not present

## 2022-08-02 DIAGNOSIS — Z1151 Encounter for screening for human papillomavirus (HPV): Secondary | ICD-10-CM | POA: Diagnosis not present

## 2022-11-06 DIAGNOSIS — Z13820 Encounter for screening for osteoporosis: Secondary | ICD-10-CM | POA: Diagnosis not present

## 2022-11-14 ENCOUNTER — Inpatient Hospital Stay: Payer: Federal, State, Local not specified - PPO

## 2022-11-14 ENCOUNTER — Inpatient Hospital Stay
Payer: Federal, State, Local not specified - PPO | Attending: Hematology and Oncology | Admitting: Hematology and Oncology

## 2022-11-14 ENCOUNTER — Encounter: Payer: Self-pay | Admitting: Hematology and Oncology

## 2022-11-14 VITALS — BP 164/91 | HR 95 | Temp 97.2°F | Resp 16 | Wt 269.7 lb

## 2022-11-14 DIAGNOSIS — E119 Type 2 diabetes mellitus without complications: Secondary | ICD-10-CM | POA: Insufficient documentation

## 2022-11-14 DIAGNOSIS — C50212 Malignant neoplasm of upper-inner quadrant of left female breast: Secondary | ICD-10-CM

## 2022-11-14 DIAGNOSIS — Z803 Family history of malignant neoplasm of breast: Secondary | ICD-10-CM | POA: Insufficient documentation

## 2022-11-14 DIAGNOSIS — Z171 Estrogen receptor negative status [ER-]: Secondary | ICD-10-CM

## 2022-11-14 DIAGNOSIS — I10 Essential (primary) hypertension: Secondary | ICD-10-CM | POA: Insufficient documentation

## 2022-11-14 DIAGNOSIS — Z658 Other specified problems related to psychosocial circumstances: Secondary | ICD-10-CM | POA: Insufficient documentation

## 2022-11-14 DIAGNOSIS — Z9221 Personal history of antineoplastic chemotherapy: Secondary | ICD-10-CM | POA: Insufficient documentation

## 2022-11-14 DIAGNOSIS — Z8042 Family history of malignant neoplasm of prostate: Secondary | ICD-10-CM | POA: Insufficient documentation

## 2022-11-14 DIAGNOSIS — Z923 Personal history of irradiation: Secondary | ICD-10-CM | POA: Insufficient documentation

## 2022-11-14 DIAGNOSIS — Z79899 Other long term (current) drug therapy: Secondary | ICD-10-CM | POA: Insufficient documentation

## 2022-11-14 DIAGNOSIS — Z853 Personal history of malignant neoplasm of breast: Secondary | ICD-10-CM | POA: Insufficient documentation

## 2022-11-14 LAB — CBC WITH DIFFERENTIAL/PLATELET
Abs Immature Granulocytes: 0.01 10*3/uL (ref 0.00–0.07)
Basophils Absolute: 0 10*3/uL (ref 0.0–0.1)
Basophils Relative: 0 %
Eosinophils Absolute: 0.2 10*3/uL (ref 0.0–0.5)
Eosinophils Relative: 3 %
HCT: 37.9 % (ref 36.0–46.0)
Hemoglobin: 12.7 g/dL (ref 12.0–15.0)
Immature Granulocytes: 0 %
Lymphocytes Relative: 40 %
Lymphs Abs: 2.2 10*3/uL (ref 0.7–4.0)
MCH: 30.3 pg (ref 26.0–34.0)
MCHC: 33.5 g/dL (ref 30.0–36.0)
MCV: 90.5 fL (ref 80.0–100.0)
Monocytes Absolute: 0.4 10*3/uL (ref 0.1–1.0)
Monocytes Relative: 7 %
Neutro Abs: 2.8 10*3/uL (ref 1.7–7.7)
Neutrophils Relative %: 50 %
Platelets: 238 10*3/uL (ref 150–400)
RBC: 4.19 MIL/uL (ref 3.87–5.11)
RDW: 12.7 % (ref 11.5–15.5)
WBC: 5.6 10*3/uL (ref 4.0–10.5)
nRBC: 0 % (ref 0.0–0.2)

## 2022-11-14 LAB — CMP (CANCER CENTER ONLY)
ALT: 20 U/L (ref 0–44)
AST: 16 U/L (ref 15–41)
Albumin: 4 g/dL (ref 3.5–5.0)
Alkaline Phosphatase: 96 U/L (ref 38–126)
Anion gap: 3 — ABNORMAL LOW (ref 5–15)
BUN: 9 mg/dL (ref 6–20)
CO2: 31 mmol/L (ref 22–32)
Calcium: 9.3 mg/dL (ref 8.9–10.3)
Chloride: 106 mmol/L (ref 98–111)
Creatinine: 0.87 mg/dL (ref 0.44–1.00)
GFR, Estimated: >60 mL/min (ref >60)
Glucose, Bld: 147 mg/dL — ABNORMAL HIGH (ref 70–99)
Potassium: 3.9 mmol/L (ref 3.5–5.1)
Sodium: 140 mmol/L (ref 135–145)
Total Bilirubin: 0.3 mg/dL (ref <1.2)
Total Protein: 7.2 g/dL (ref 6.5–8.1)

## 2022-11-14 NOTE — Progress Notes (Signed)
[facility]  Telephone:(336) [phone] Fax:(336) [phone]        ID: Cassandra Sanders DOB: 1965-09-02  MR#: [id number]  WNU#:[id number]   Patient Care Team: Milus Height, PA as PCP - General (Nurse Practitioner) Magrinat, Valentino Hue, MD (Inactive) as Consulting Physician (Oncology) Emelia Loron, MD as Consulting Physician (General Surgery) Dorothy Puffer, MD as Consulting Physician (Radiation Oncology) Zelphia Cairo, MD as Consulting Physician (Obstetrics and Gynecology) OTHER MD:   CHIEF COMPLAINT: Triple negative breast cancer   CURRENT TREATMENT: Observation    INTERVAL HISTORY:  Cassandra Sanders returns today for a follow-up of her triple negative breast cancer.   The patient, a breast cancer survivor, presents for a routine follow-up. She reports significant social stressors over the past year and a half due to a failed marriage. She is working on maintaining a stress-free lifestyle, focusing on sleep, nutrition, and meditation. 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 She has one son, aged 59. She feels well, no complaints.  No change in breathing or bowel habits or urinary habits. Rest of the pertinent 10 point ROS reviewed and negative    COVID 19 VACCINATION STATUS:      HISTORY OF CURRENT ILLNESS: From the original intake note:   Cassandra Sanders had routine screening mammography on 05/15/2018 at [facility] showing a possible abnormality in the left breast. She underwent bilateral diagnostic mammography with tomography and left breast ultrasonography at [facility] on 06/02/2018 showing: breast density category B; suspicious approximate 0.9 cm mass involving the upper inner quadrant of the left breast at posterior depth which account for the screening mammographic finding; no pathologic left axillary lymphadenopathy.   Accordingly on 06/04/2018 she proceeded to biopsy of the left breast area in question. The pathology from  this procedure ([id number]) showed: invasive ductal carcinoma, grade 3. Prognostic indicators significant for: estrogen receptor, 60% positive with moderate staining intensity and progesterone receptor, 0% negative. Proliferation marker Ki67 at 80%. HER2 negative by immunohistochemistry (0).   The patient's subsequent history is as detailed below.   MEDICAL HISTORY: Past Medical History:  Diagnosis Date   Breast cancer (HCC) 06/04/2018   left breast   Diabetes mellitus without complication (HCC)    Family history of breast cancer    Family history of prostate cancer    Hypertension    Personal history of chemotherapy 07/17/2018-10/09/2018   breast cancer   Personal history of radiation therapy 11/09/2018-12/25/2018   left breast    SURGICAL HISTORY: Past Surgical History:  Procedure Laterality Date   BREAST BIOPSY     BREAST LUMPECTOMY Left 06/26/2018   BREAST LUMPECTOMY WITH RADIOACTIVE SEED AND SENTINEL LYMPH NODE BIOPSY Left 06/26/2018   Procedure: LEFT BREAST LUMPECTOMY WITH RADIOACTIVE SEED AND LEFT AXILLARY SENTINEL LYMPH NODE BIOPSY;  Surgeon: Emelia Loron, MD;  Location: [facility];  Service: General;  Laterality: Left;   PORTACATH PLACEMENT Right 06/26/2018   Procedure: INSERTION PORT-A-CATH WITH ULTRASOUND;  Surgeon: Emelia Loron, MD;  Location: [facility];  Service: General;  Laterality: Right;    FAMILY HISTORY: Family History  Adopted: Yes  Problem Relation Age of Onset   Prostate cancer Brother    Breast cancer Cousin        dx in their 32s  Patient's father was in his 30s when he was murdered. Patient's mother is currently (as of 06/2018) at age 23. The patient denies a family hx of breast or ovarian cancer. She has  3 brothers and no biological sisters.     GYNECOLOGIC HISTORY:  Menarche: 57 years old Age at first live birth: 57 years old GX P 1 LMP June 2015 Contraceptive used from age [age] with no complications HRT  none  Hysterectomy? no BSO? 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  She is engaged to [facility]. She built her own home and is living there by herself and enjoying it.  Her son Cassandra Sanders, age 43, currently lives in [place] working as a Immunologist.  The patient attends [employer].              ADVANCED DIRECTIVES: not in place; at the 06/14/2019 visit this was discussed in she intends to name her son is her healthcare power of attorney.  She was given the appropriate documents to complete and notarized at her discretion   HEALTH MAINTENANCE:  Social History   Tobacco Use   Smoking status: Never   Smokeless tobacco: Never  Substance Use Topics   Alcohol use: No   Drug use: No    No Known Allergies   Current Outpatient Medications  Medication Sig Dispense Refill   aspirin EC 81 MG tablet Take 81 mg by mouth daily.     atorvastatin (LIPITOR) 10 MG tablet Take 10 mg by mouth daily.     JARDIANCE 25 MG TABS tablet Take 25 mg by mouth daily.     lisinopril (PRINIVIL,ZESTRIL) 10 MG tablet Take 10 mg by mouth daily.  2   METFORMIN HCL PO Take 1,000 mg by mouth daily.      No current facility-administered medications for this visit.    PHYSICAL EXAMINATION: African-American woman who appears stated age Vitals:   11/14/22 1105  BP: (!) 164/91  Pulse: 95  Resp: 16  Temp: (!) 97.2 F (36.2 C)  SpO2: 98%  Body mass index is 39.83 kg/m. Filed Weights   11/14/22 1105  Weight: 269 lb 11.2 oz (122.3 kg)    ECOG PERFORMANCE STATUS: 1 - Symptomatic but completely ambulatory  Physical Exam Constitutional:      Appearance: Normal appearance.  Cardiovascular:     Rate and Rhythm: Normal rate and regular rhythm.  Chest:     Comments: Bilateral breast examined.  No palpable masses or regional adenopathy. Abdominal:     General:  Abdomen is flat. Bowel sounds are normal. There is no distension.     Tenderness: There is no abdominal tenderness.  Musculoskeletal:     Cervical back: Normal range of motion and neck supple. No rigidity.  Lymphadenopathy:     Cervical: No cervical adenopathy.  Skin:    General: Skin is warm and dry.  Neurological:     General: No focal deficit present.     Mental Status: She is alert.       LABORATORY DATA: Lab Results  Component Value Date   WBC 5.5 07/17/2021   HGB 13.5 07/17/2021   HCT 39.9 07/17/2021   MCV 90.7 07/17/2021   PLT 259 07/17/2021      Chemistry      Component Value Date/Time   NA 141 07/17/2021 1538   K 3.8 07/17/2021 1538   CL 106 07/17/2021 1538   CO2 29 07/17/2021 1538   BUN 11 07/17/2021 1538   CREATININE 1.01 (H) 07/17/2021 1538      Component  Value Date/Time   CALCIUM 9.7 07/17/2021 1538   ALKPHOS 106 07/17/2021 1538   AST 16 07/17/2021 1538   ALT 26 07/17/2021 1538   BILITOT 0.2 (L) 07/17/2021 1538      RADIOGRAPHIC STUDIES: No results found.   ELIGIBLE FOR AVAILABLE RESEARCH PROTOCOL: no   ASSESSMENT: 57 y.o. Cassandra Sanders woman status post left breast upper inner quadrant biopsy 06/04/2018 for a clinical T1b N0, stage IB invasive ductal carcinoma, grade 3, estrogen receptor moderately positive, progesterone receptor negative, with an MIB-1 of 80%, and HER-2 nonamplified   (1) left lumpectomy and sentinel lymph node sampling 06/26/2018 showed a pT1c pN0, stage IB invasive ductal carcinoma, grade 3, with negative margins             (a) repeat prognostic panel triple negative             (b) a total of 3 sentinel lymph nodes were removed   (2) Oncotype score of 56 obtained from the 06/04/2018 biopsy predicts a risk of outside the breast recurrence over the next 9 years of nearly 40% if the patient's only systemic therapy is antiestrogens for 5 years.  It also predicts a significant benefit from chemotherapy.             (a) Oncotype  reads the tumor also as triple negative.   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     PLAN:  Cassandra Sanders is now just over 4.5 years out from diagnosis for her breast cancer with no evidence of disease recurrence.   This is particularly favorable as triple negative cases if they are going to recur tend to recur early. Recent mammogram in July without any evidence of malignancy.  Physical examination today without any concerns. She will return to clinic for follow-up in 1 year or sooner as needed.  Hypertension Blood pressure slightly elevated during the visit, possibly due to the stress of the visit. -Continue Lisinopril for blood pressure management.  Type 2 Diabetes Managed with Jardiance. -Continue Jardiance for diabetes management.  Psychosocial Stressors Recent end of a marriage, causing significant emotional stress. Patient is actively managing stress through meditation and maintaining a positive outlook. -Encourage continued stress management strategies including meditation.  General Health Maintenance -Continue regular check-ups with primary care physician and gynecologist. -Encourage continued focus on sleep and nutrition for overall health.  CBC, CMP today Total encounter time 30 minutes.*  *Total Encounter Time as defined by the Centers for Medicare and Medicaid Services includes, in addition to the face-to-face time of a patient visit (documented in the note above) non-face-to-face time: obtaining and  reviewing outside history, ordering and reviewing medications, tests or procedures, care coordination (communications with other health care professionals or caregivers) and documentation in the medical record.

## 2022-11-15 ENCOUNTER — Telehealth: Payer: Self-pay | Admitting: Hematology and Oncology

## 2022-11-15 NOTE — Telephone Encounter (Signed)
Spoke with patient confirming upcoming appointment  

## 2022-11-28 NOTE — Telephone Encounter (Signed)
Telephone call  

## 2023-03-24 DIAGNOSIS — E1169 Type 2 diabetes mellitus with other specified complication: Secondary | ICD-10-CM | POA: Diagnosis not present

## 2023-03-24 DIAGNOSIS — G629 Polyneuropathy, unspecified: Secondary | ICD-10-CM | POA: Diagnosis not present

## 2023-03-24 DIAGNOSIS — I10 Essential (primary) hypertension: Secondary | ICD-10-CM | POA: Diagnosis not present

## 2023-06-26 DIAGNOSIS — I10 Essential (primary) hypertension: Secondary | ICD-10-CM | POA: Diagnosis not present

## 2023-06-26 DIAGNOSIS — E1169 Type 2 diabetes mellitus with other specified complication: Secondary | ICD-10-CM | POA: Diagnosis not present

## 2023-06-26 DIAGNOSIS — G629 Polyneuropathy, unspecified: Secondary | ICD-10-CM | POA: Diagnosis not present

## 2023-07-14 ENCOUNTER — Ambulatory Visit: Payer: Federal, State, Local not specified - PPO

## 2023-08-20 ENCOUNTER — Encounter: Payer: Self-pay | Admitting: Physician Assistant

## 2023-08-20 ENCOUNTER — Ambulatory Visit
Admission: RE | Admit: 2023-08-20 | Discharge: 2023-08-20 | Disposition: A | Discharge status: Home / Self Care | Source: Ambulatory Visit | Attending: Hematology and Oncology | Admitting: Hematology and Oncology

## 2023-08-20 DIAGNOSIS — Z1231 Encounter for screening mammogram for malignant neoplasm of breast: Secondary | ICD-10-CM | POA: Diagnosis not present

## 2023-08-20 DIAGNOSIS — Z171 Estrogen receptor negative status [ER-]: Secondary | ICD-10-CM

## 2023-08-20 DIAGNOSIS — Z124 Encounter for screening for malignant neoplasm of cervix: Secondary | ICD-10-CM | POA: Diagnosis not present

## 2023-08-20 DIAGNOSIS — Z1151 Encounter for screening for human papillomavirus (HPV): Secondary | ICD-10-CM | POA: Diagnosis not present

## 2023-08-20 DIAGNOSIS — C50212 Malignant neoplasm of upper-inner quadrant of left female breast: Secondary | ICD-10-CM

## 2023-08-20 DIAGNOSIS — Z01419 Encounter for gynecological examination (general) (routine) without abnormal findings: Secondary | ICD-10-CM | POA: Diagnosis not present

## 2023-10-27 ENCOUNTER — Telehealth: Payer: Self-pay | Admitting: Hematology and Oncology

## 2023-10-27 NOTE — Telephone Encounter (Signed)
 I spoke with patient to reschedule appointment from 11/17/2023 to 11/18/2023. Patient aware of date and time change.

## 2023-10-29 DIAGNOSIS — Z7985 Long-term (current) use of injectable non-insulin antidiabetic drugs: Secondary | ICD-10-CM | POA: Diagnosis not present

## 2023-10-29 DIAGNOSIS — E1169 Type 2 diabetes mellitus with other specified complication: Secondary | ICD-10-CM | POA: Diagnosis not present

## 2023-10-29 DIAGNOSIS — I10 Essential (primary) hypertension: Secondary | ICD-10-CM | POA: Diagnosis not present

## 2023-10-29 DIAGNOSIS — Z0000 Encounter for general adult medical examination without abnormal findings: Secondary | ICD-10-CM | POA: Diagnosis not present

## 2023-10-29 DIAGNOSIS — G629 Polyneuropathy, unspecified: Secondary | ICD-10-CM | POA: Diagnosis not present

## 2023-10-29 DIAGNOSIS — Z23 Encounter for immunization: Secondary | ICD-10-CM | POA: Diagnosis not present

## 2023-11-17 ENCOUNTER — Ambulatory Visit: Payer: Federal, State, Local not specified - PPO | Admitting: Hematology and Oncology

## 2023-11-17 ENCOUNTER — Telehealth: Payer: Self-pay

## 2023-11-17 NOTE — Telephone Encounter (Signed)
 Spoke with patient and confirmed appointment on 11/11

## 2023-11-18 ENCOUNTER — Inpatient Hospital Stay: Attending: Hematology and Oncology | Admitting: Hematology and Oncology

## 2023-11-18 VITALS — BP 141/98 | HR 85 | Temp 97.6°F | Resp 16 | Wt 266.3 lb

## 2023-11-18 DIAGNOSIS — Z1732 Human epidermal growth factor receptor 2 negative status: Secondary | ICD-10-CM | POA: Diagnosis not present

## 2023-11-18 DIAGNOSIS — Z1722 Progesterone receptor negative status: Secondary | ICD-10-CM | POA: Insufficient documentation

## 2023-11-18 DIAGNOSIS — Z9221 Personal history of antineoplastic chemotherapy: Secondary | ICD-10-CM | POA: Diagnosis not present

## 2023-11-18 DIAGNOSIS — E119 Type 2 diabetes mellitus without complications: Secondary | ICD-10-CM | POA: Insufficient documentation

## 2023-11-18 DIAGNOSIS — Z171 Estrogen receptor negative status [ER-]: Secondary | ICD-10-CM | POA: Diagnosis not present

## 2023-11-18 DIAGNOSIS — Z7982 Long term (current) use of aspirin: Secondary | ICD-10-CM | POA: Insufficient documentation

## 2023-11-18 DIAGNOSIS — I10 Essential (primary) hypertension: Secondary | ICD-10-CM | POA: Insufficient documentation

## 2023-11-18 DIAGNOSIS — G629 Polyneuropathy, unspecified: Secondary | ICD-10-CM | POA: Insufficient documentation

## 2023-11-18 DIAGNOSIS — Z923 Personal history of irradiation: Secondary | ICD-10-CM | POA: Diagnosis not present

## 2023-11-18 DIAGNOSIS — Z803 Family history of malignant neoplasm of breast: Secondary | ICD-10-CM | POA: Insufficient documentation

## 2023-11-18 DIAGNOSIS — C50212 Malignant neoplasm of upper-inner quadrant of left female breast: Secondary | ICD-10-CM | POA: Diagnosis not present

## 2023-11-18 DIAGNOSIS — Z79899 Other long term (current) drug therapy: Secondary | ICD-10-CM | POA: Diagnosis not present

## 2023-11-18 DIAGNOSIS — Z170 Estrogen receptor positive status [ER+]: Secondary | ICD-10-CM | POA: Insufficient documentation

## 2023-11-18 NOTE — Progress Notes (Signed)
 [facility]  [phone] Fax:([phone]) [phone]        ID: Cassandra Sanders DOB: 1965/05/07  MR#: [id number]  RDW#:[id number]   Patient Care Team: Alvera Reagin, PA as PCP - General (Nurse Practitioner) Ebbie Cough, MD as Consulting Physician (General Surgery) Dewey Rush, MD as Consulting Physician (Radiation Oncology) Latisha Medford, MD as Consulting Physician (Obstetrics and Gynecology) OTHER MD:   CHIEF COMPLAINT: Triple negative breast cancer   CURRENT TREATMENT: Observation   INTERVAL HISTORY:  Cassandra Sanders returns today for a follow-up of her triple negative breast cancer.    Discussed the use of AI scribe software for clinical note transcription with the patient, who gave verbal consent to proceed.  History of Present Illness Cassandra Sanders  is a 58 year old female with triple negative breast cancer who presents for a routine follow-up visit.  It has been five years since her diagnosis of triple negative breast cancer in 05-28-2018. She has undergone genetic testing and mammograms, with no new hospitalizations or medications reported. She wants to continue follow-up beyond the typical five-year mark.  She manages her diabetes with Jardiance. Her previous A1c level was 12, which she reduced to 8.2 through lifestyle changes. She is not on insulin and prefers to manage her condition without it.  Her husband passed away in 2025/05/21from stage four liver cancer and kidney failure. She describes the emotional impact of his illness and death, including family dynamics and her role in managing his affairs. She feels at peace now and has been focusing on self-care.  No swelling in her legs and notes improvement in neuropathy symptoms, which she manages without medication.   Rest of the pertinent 10 point ROS reviewed and negative    COVID 19 VACCINATION STATUS:      HISTORY OF CURRENT ILLNESS: From the original intake note:   Cassandra Sanders had routine screening mammography on 05/15/2018 at [facility] showing a possible abnormality in the left breast. She underwent bilateral diagnostic mammography with tomography and left breast ultrasonography at [facility] on 06/02/2018 showing: breast density category B; suspicious approximate 0.9 cm mass involving the upper inner quadrant of the left breast at posterior depth which account for the screening mammographic finding; no pathologic left axillary lymphadenopathy.   Accordingly on 06/04/2018 she proceeded to biopsy of the left breast area in question. The pathology from this procedure ([id number]) showed: invasive ductal carcinoma, grade 3. Prognostic indicators significant for: estrogen receptor, 60% positive with moderate staining intensity and progesterone receptor, 0% negative. Proliferation marker Ki67 at 80%. HER2 negative by immunohistochemistry (0).   The patient's subsequent history is as detailed below.   MEDICAL HISTORY: Past Medical History:  Diagnosis Date   Breast cancer (HCC) 06/04/2018   left breast   Diabetes mellitus without complication (HCC)    Family history of breast cancer    Family history of prostate cancer    Hypertension    Personal history of chemotherapy 07/17/2018-10/09/2018   breast cancer   Personal history of radiation therapy 11/09/2018-12/25/2018   left breast    SURGICAL HISTORY: Past Surgical History:  Procedure Laterality Date   BREAST BIOPSY     BREAST LUMPECTOMY Left 06/26/2018   BREAST LUMPECTOMY WITH RADIOACTIVE SEED AND SENTINEL LYMPH NODE BIOPSY Left 06/26/2018   Procedure: LEFT BREAST LUMPECTOMY WITH RADIOACTIVE SEED AND LEFT AXILLARY SENTINEL LYMPH NODE BIOPSY;  Surgeon: Ebbie Cough, MD;  Location: [facility];  Service: General;  Laterality:  Left;   PORTACATH PLACEMENT Right 06/26/2018   Procedure: INSERTION PORT-A-CATH WITH ULTRASOUND;  Surgeon: Ebbie Cough, MD;  Location: [facility];  Service: General;  Laterality: Right;    FAMILY HISTORY: Family History  Adopted: Yes  Problem Relation Age of Onset   Prostate cancer Brother    Breast cancer Cousin        dx in their 101s  Patient's father was in his 30s when he was murdered. Patient's mother is currently (as of 06/2018) at age 46. The patient denies a family hx of breast or ovarian cancer. She has 3 brothers and no biological sisters.     GYNECOLOGIC HISTORY:  Menarche: 58 years old Age at first live birth: 58 years old GX P 1 LMP June 2015 Contraceptive used from age [age] with no complications HRT none  Hysterectomy? no BSO? 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  She is also writing novels.  She is engaged to [employer] . She built her own home and is living there by herself and enjoying it.  Her son Patriciaann, age 57, currently lives in [place]  working as a immunologist.  The patient attends [employer].              ADVANCED DIRECTIVES: not in place; at the 06/14/2019 visit this was discussed in she intends to name her son is her healthcare power of attorney.  She was given the appropriate documents to complete and notarized at her discretion   HEALTH MAINTENANCE:  Social History   Tobacco Use   Smoking status: Never   Smokeless tobacco: Never  Substance Use Topics   Alcohol use: No   Drug use: No    No Known Allergies   Current Outpatient Medications  Medication Sig Dispense Refill   aspirin EC 81 MG tablet Take 81 mg by mouth daily.     atorvastatin (LIPITOR) 10 MG tablet Take 10 mg by mouth daily.     JARDIANCE 25 MG TABS tablet Take 25 mg by mouth daily.     lisinopril (PRINIVIL,ZESTRIL) 10 MG tablet Take 10 mg by mouth daily.  2   METFORMIN HCL PO Take 1,000 mg by mouth daily.      No current facility-administered medications for this visit.     PHYSICAL EXAMINATION: African-American woman who appears stated age Vitals:   11/18/23 1154  BP: (!) 141/98  Pulse: 85  Resp: 16  Temp: 97.6 F (36.4 C)  SpO2: 93%  Body mass index is 39.33 kg/m. Filed Weights   11/18/23 1154  Weight: 266 lb 4.8 oz (120.8 kg)    ECOG PERFORMANCE STATUS: 1 - Symptomatic but completely ambulatory  Physical Exam Constitutional:      Appearance: Normal appearance.  Cardiovascular:     Rate and Rhythm: Normal rate and regular rhythm.  Chest:     Comments: Bilateral breast examined.  No palpable masses or regional adenopathy. Abdominal:     General: Abdomen is flat. Bowel sounds are normal. There is no distension.     Tenderness: There is no abdominal tenderness.  Musculoskeletal:     Cervical back: Normal range of motion and neck supple. No rigidity.  Lymphadenopathy:     Cervical: No cervical adenopathy.  Skin:    General: Skin is warm and dry.  Neurological:     General: No focal deficit present.  Mental Status: She is alert.       LABORATORY DATA: Lab Results  Component Value Date   WBC 5.6 11/14/2022   HGB 12.7 11/14/2022   HCT 37.9 11/14/2022   MCV 90.5 11/14/2022   PLT 238 11/14/2022      Chemistry      Component Value Date/Time   NA 140 11/14/2022 1155   K 3.9 11/14/2022 1155   CL 106 11/14/2022 1155   CO2 31 11/14/2022 1155   BUN 9 11/14/2022 1155   CREATININE 0.87 11/14/2022 1155      Component Value Date/Time   CALCIUM 9.3 11/14/2022 1155   ALKPHOS 96 11/14/2022 1155   AST 16 11/14/2022 1155   ALT 20 11/14/2022 1155   BILITOT 0.3 11/14/2022 1155      RADIOGRAPHIC STUDIES: No results found.   ELIGIBLE FOR AVAILABLE RESEARCH PROTOCOL: no   ASSESSMENT: 58 y.o. [place] woman status post left breast upper inner quadrant biopsy 06/04/2018 for a clinical T1b N0, stage IB invasive ductal carcinoma, grade 3, estrogen receptor moderately positive, progesterone receptor negative, with an MIB-1 of 80%,  and HER-2 nonamplified   (1) left lumpectomy and sentinel lymph node sampling 06/26/2018 showed a pT1c pN0, stage IB invasive ductal carcinoma, grade 3, with negative margins             (a) repeat prognostic panel triple negative             (b) a total of 3 sentinel lymph nodes were removed   (2) Oncotype score of 56 obtained from the 06/04/2018 biopsy predicts a risk of outside the breast recurrence over the next 9 years of nearly 40% if the patient's only systemic therapy is antiestrogens for 5 years.  It also predicts a significant benefit from chemotherapy.             (a) Oncotype reads the tumor also as triple negative.   (3) adjuvant chemotherapy consisting of cyclophosphamide  and doxorubicin  in dose dense fashion x4 started 07/17/2018, completed 09/03/2018, followed by weekly paclitaxel  and carboplatin  x12 starting on 09/18/2018, last dose 10/09/2018             (a) echo on 07/16/2018 shows EF of 60-65%  (b) carboplatinum/paclitaxel  discontinued after 4 of 12 planned doses due to neuropathy   (4) adjuvant radiation therapy 11/09/2018-12/24/2018 to the left breast:  (a) 50.4 Gy in 28 fractions followed by a 10 Gy in 5 fractions.     PLAN:  Assessment and Plan Assessment & Plan Survivor of triple negative breast cancer, in remission In remission for five years since diagnosis in May 2020.  Mammogram satisfactory. No new symptoms or concerns.  We discussed that its very favorable that she has no evidence of recurrence at 5 yrs.  Type 2 diabetes mellitus Managed with Jardiance. Recent A1c improved to 8.2 from 12. She declined insulin, preferring lifestyle modifications.  - Continue Jardiance for diabetes management. - Encouraged lifestyle modifications to further reduce A1c. - FU with PCP as scheduled.  Essential hypertension Well controlled with lisinopril. - Continue lisinopril for blood pressure management.  Peripheral neuropathy Mild peripheral neuropathy persists. She  manages symptoms with non-pharmacological interventions and declines medication. - Continue current management strategies for neuropathy. Total encounter time 30 minutes.*  *Total Encounter Time as defined by the Centers for Medicare and Medicaid Services includes, in addition to the face-to-face time of a patient visit (documented in the note above) non-face-to-face time: obtaining and reviewing outside history, ordering and reviewing medications,  tests or procedures, care coordination (communications with other health care professionals or caregivers) and documentation in the medical record.
